# Patient Record
Sex: Female | Born: 1950 | Race: White | Hispanic: No | Marital: Single | Smoking: Never smoker
Health system: Southern US, Community
[De-identification: ages and names within clinical notes are randomized; demographics above are authoritative.]

## PROBLEM LIST (undated history)

## (undated) DIAGNOSIS — E559 Vitamin D deficiency, unspecified: Secondary | ICD-10-CM

## (undated) DIAGNOSIS — E119 Type 2 diabetes mellitus without complications: Secondary | ICD-10-CM

## (undated) DIAGNOSIS — Z923 Personal history of irradiation: Secondary | ICD-10-CM

## (undated) DIAGNOSIS — T8859XA Other complications of anesthesia, initial encounter: Secondary | ICD-10-CM

## (undated) DIAGNOSIS — M109 Gout, unspecified: Secondary | ICD-10-CM

## (undated) DIAGNOSIS — Z973 Presence of spectacles and contact lenses: Secondary | ICD-10-CM

## (undated) DIAGNOSIS — I10 Essential (primary) hypertension: Secondary | ICD-10-CM

## (undated) DIAGNOSIS — E7800 Pure hypercholesterolemia, unspecified: Secondary | ICD-10-CM

## (undated) DIAGNOSIS — M1990 Unspecified osteoarthritis, unspecified site: Secondary | ICD-10-CM

## (undated) DIAGNOSIS — E782 Mixed hyperlipidemia: Secondary | ICD-10-CM

## (undated) DIAGNOSIS — N183 Chronic kidney disease, stage 3 unspecified: Secondary | ICD-10-CM

## (undated) HISTORY — PX: EYE SURGERY: SHX253

## (undated) HISTORY — PX: BREAST LUMPECTOMY: SHX2

---
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
## 1987-10-14 HISTORY — PX: CHOLECYSTECTOMY: SHX55

## 2000-03-09 ENCOUNTER — Encounter: Admission: RE | Admit: 2000-03-09 | Discharge: 2000-03-09 | Payer: Self-pay | Admitting: Family Medicine

## 2000-03-09 ENCOUNTER — Encounter: Payer: Self-pay | Admitting: Family Medicine

## 2000-04-02 ENCOUNTER — Other Ambulatory Visit: Admission: RE | Admit: 2000-04-02 | Discharge: 2000-04-02 | Payer: Self-pay | Admitting: Family Medicine

## 2000-06-22 ENCOUNTER — Other Ambulatory Visit: Admission: RE | Admit: 2000-06-22 | Discharge: 2000-06-22 | Payer: Self-pay | Admitting: Family Medicine

## 2000-08-04 ENCOUNTER — Other Ambulatory Visit: Admission: RE | Admit: 2000-08-04 | Discharge: 2000-08-04 | Payer: Self-pay | Admitting: Obstetrics and Gynecology

## 2000-08-04 ENCOUNTER — Encounter (INDEPENDENT_AMBULATORY_CARE_PROVIDER_SITE_OTHER): Payer: Self-pay | Admitting: Specialist

## 2000-10-13 HISTORY — PX: LASIK: SHX215

## 2001-01-05 ENCOUNTER — Other Ambulatory Visit: Admission: RE | Admit: 2001-01-05 | Discharge: 2001-01-05 | Payer: Self-pay | Admitting: Obstetrics and Gynecology

## 2001-01-14 ENCOUNTER — Encounter (INDEPENDENT_AMBULATORY_CARE_PROVIDER_SITE_OTHER): Payer: Self-pay | Admitting: Specialist

## 2001-01-14 ENCOUNTER — Other Ambulatory Visit: Admission: RE | Admit: 2001-01-14 | Discharge: 2001-01-14 | Payer: Self-pay | Admitting: Obstetrics and Gynecology

## 2001-03-10 ENCOUNTER — Encounter: Payer: Self-pay | Admitting: Family Medicine

## 2001-03-10 ENCOUNTER — Encounter: Admission: RE | Admit: 2001-03-10 | Discharge: 2001-03-10 | Payer: Self-pay | Admitting: Family Medicine

## 2001-05-06 ENCOUNTER — Other Ambulatory Visit: Admission: RE | Admit: 2001-05-06 | Discharge: 2001-05-06 | Payer: Self-pay | Admitting: Obstetrics and Gynecology

## 2001-09-13 ENCOUNTER — Other Ambulatory Visit: Admission: RE | Admit: 2001-09-13 | Discharge: 2001-09-13 | Payer: Self-pay | Admitting: Obstetrics and Gynecology

## 2002-03-16 ENCOUNTER — Encounter: Payer: Self-pay | Admitting: Family Medicine

## 2002-03-16 ENCOUNTER — Encounter: Admission: RE | Admit: 2002-03-16 | Discharge: 2002-03-16 | Payer: Self-pay | Admitting: Family Medicine

## 2002-04-28 ENCOUNTER — Other Ambulatory Visit: Admission: RE | Admit: 2002-04-28 | Discharge: 2002-04-28 | Payer: Self-pay | Admitting: Obstetrics and Gynecology

## 2003-03-22 ENCOUNTER — Encounter: Admission: RE | Admit: 2003-03-22 | Discharge: 2003-03-22 | Payer: Self-pay | Admitting: Family Medicine

## 2003-03-22 ENCOUNTER — Encounter: Payer: Self-pay | Admitting: Family Medicine

## 2003-05-02 ENCOUNTER — Other Ambulatory Visit: Admission: RE | Admit: 2003-05-02 | Discharge: 2003-05-02 | Payer: Self-pay | Admitting: Obstetrics and Gynecology

## 2003-09-20 ENCOUNTER — Other Ambulatory Visit: Admission: RE | Admit: 2003-09-20 | Discharge: 2003-09-20 | Payer: Self-pay | Admitting: Obstetrics and Gynecology

## 2004-04-25 ENCOUNTER — Encounter: Admission: RE | Admit: 2004-04-25 | Discharge: 2004-04-25 | Payer: Self-pay | Admitting: Family Medicine

## 2004-05-01 ENCOUNTER — Encounter: Admission: RE | Admit: 2004-05-01 | Discharge: 2004-05-01 | Payer: Self-pay | Admitting: Family Medicine

## 2005-05-02 ENCOUNTER — Encounter: Admission: RE | Admit: 2005-05-02 | Discharge: 2005-05-02 | Payer: Self-pay | Admitting: Family Medicine

## 2006-05-04 ENCOUNTER — Encounter: Admission: RE | Admit: 2006-05-04 | Discharge: 2006-05-04 | Payer: Self-pay | Admitting: Family Medicine

## 2007-05-06 ENCOUNTER — Encounter: Admission: RE | Admit: 2007-05-06 | Discharge: 2007-05-06 | Payer: Self-pay | Admitting: Family Medicine

## 2008-05-08 ENCOUNTER — Encounter: Admission: RE | Admit: 2008-05-08 | Discharge: 2008-05-08 | Payer: Self-pay | Admitting: Family Medicine

## 2009-05-09 ENCOUNTER — Encounter: Admission: RE | Admit: 2009-05-09 | Discharge: 2009-05-09 | Payer: Self-pay | Admitting: Family Medicine

## 2010-05-13 ENCOUNTER — Encounter: Admission: RE | Admit: 2010-05-13 | Discharge: 2010-05-13 | Payer: Self-pay | Admitting: Family Medicine

## 2011-04-22 ENCOUNTER — Other Ambulatory Visit: Payer: Self-pay | Admitting: Family Medicine

## 2011-04-22 DIAGNOSIS — Z1231 Encounter for screening mammogram for malignant neoplasm of breast: Secondary | ICD-10-CM

## 2011-05-19 ENCOUNTER — Ambulatory Visit
Admission: RE | Admit: 2011-05-19 | Discharge: 2011-05-19 | Disposition: A | Discharge status: Home / Self Care | Payer: 59 | Source: Ambulatory Visit | Attending: Family Medicine | Admitting: Family Medicine

## 2011-05-19 DIAGNOSIS — Z1231 Encounter for screening mammogram for malignant neoplasm of breast: Secondary | ICD-10-CM

## 2012-04-22 ENCOUNTER — Other Ambulatory Visit: Payer: Self-pay | Admitting: Family Medicine

## 2012-04-22 DIAGNOSIS — Z1231 Encounter for screening mammogram for malignant neoplasm of breast: Secondary | ICD-10-CM

## 2012-05-19 ENCOUNTER — Ambulatory Visit
Admission: RE | Admit: 2012-05-19 | Discharge: 2012-05-19 | Disposition: A | Discharge status: Home / Self Care | Payer: 59 | Source: Ambulatory Visit | Attending: Family Medicine | Admitting: Family Medicine

## 2012-05-19 ENCOUNTER — Ambulatory Visit: Payer: 59

## 2012-05-19 DIAGNOSIS — Z1231 Encounter for screening mammogram for malignant neoplasm of breast: Secondary | ICD-10-CM

## 2013-06-23 ENCOUNTER — Other Ambulatory Visit: Payer: Self-pay

## 2013-06-23 DIAGNOSIS — Z1231 Encounter for screening mammogram for malignant neoplasm of breast: Secondary | ICD-10-CM

## 2013-06-27 ENCOUNTER — Ambulatory Visit
Admission: RE | Admit: 2013-06-27 | Discharge: 2013-06-27 | Disposition: A | Discharge status: Home / Self Care | Payer: 59 | Source: Ambulatory Visit

## 2013-06-27 DIAGNOSIS — Z1231 Encounter for screening mammogram for malignant neoplasm of breast: Secondary | ICD-10-CM

## 2014-07-14 ENCOUNTER — Other Ambulatory Visit: Payer: Self-pay

## 2014-07-14 DIAGNOSIS — Z1231 Encounter for screening mammogram for malignant neoplasm of breast: Secondary | ICD-10-CM

## 2014-07-18 ENCOUNTER — Encounter (INDEPENDENT_AMBULATORY_CARE_PROVIDER_SITE_OTHER): Payer: Self-pay

## 2014-07-18 ENCOUNTER — Ambulatory Visit
Admission: RE | Admit: 2014-07-18 | Discharge: 2014-07-18 | Disposition: A | Discharge status: Home / Self Care | Payer: 59 | Source: Ambulatory Visit

## 2014-07-18 DIAGNOSIS — Z1231 Encounter for screening mammogram for malignant neoplasm of breast: Secondary | ICD-10-CM

## 2015-07-16 ENCOUNTER — Other Ambulatory Visit: Payer: Self-pay

## 2015-07-16 DIAGNOSIS — Z1231 Encounter for screening mammogram for malignant neoplasm of breast: Secondary | ICD-10-CM

## 2015-08-02 ENCOUNTER — Ambulatory Visit: Payer: Self-pay

## 2015-08-27 ENCOUNTER — Ambulatory Visit
Admission: RE | Admit: 2015-08-27 | Discharge: 2015-08-27 | Disposition: A | Discharge status: Home / Self Care | Payer: 59 | Source: Ambulatory Visit

## 2015-08-27 DIAGNOSIS — Z1231 Encounter for screening mammogram for malignant neoplasm of breast: Secondary | ICD-10-CM

## 2015-08-30 ENCOUNTER — Other Ambulatory Visit: Payer: Self-pay | Admitting: Family Medicine

## 2015-08-30 DIAGNOSIS — N63 Unspecified lump in unspecified breast: Secondary | ICD-10-CM

## 2015-09-12 ENCOUNTER — Ambulatory Visit
Admission: RE | Admit: 2015-09-12 | Discharge: 2015-09-12 | Disposition: A | Discharge status: Home / Self Care | Payer: 59 | Source: Ambulatory Visit | Attending: Family Medicine | Admitting: Family Medicine

## 2015-09-12 DIAGNOSIS — N63 Unspecified lump in unspecified breast: Secondary | ICD-10-CM

## 2016-08-01 ENCOUNTER — Other Ambulatory Visit: Payer: Self-pay | Admitting: Family Medicine

## 2016-08-01 DIAGNOSIS — Z1231 Encounter for screening mammogram for malignant neoplasm of breast: Secondary | ICD-10-CM

## 2016-09-15 ENCOUNTER — Ambulatory Visit
Admission: RE | Admit: 2016-09-15 | Discharge: 2016-09-15 | Disposition: A | Discharge status: Home / Self Care | Payer: 59 | Source: Ambulatory Visit | Attending: Family Medicine | Admitting: Family Medicine

## 2016-09-15 DIAGNOSIS — Z1231 Encounter for screening mammogram for malignant neoplasm of breast: Secondary | ICD-10-CM

## 2018-06-01 ENCOUNTER — Other Ambulatory Visit: Payer: Self-pay | Admitting: Family Medicine

## 2018-06-01 DIAGNOSIS — Z1231 Encounter for screening mammogram for malignant neoplasm of breast: Secondary | ICD-10-CM

## 2018-06-30 ENCOUNTER — Ambulatory Visit
Admission: RE | Admit: 2018-06-30 | Discharge: 2018-06-30 | Disposition: A | Discharge status: Home / Self Care | Payer: 59 | Source: Ambulatory Visit | Attending: Family Medicine | Admitting: Family Medicine

## 2018-06-30 DIAGNOSIS — Z1231 Encounter for screening mammogram for malignant neoplasm of breast: Secondary | ICD-10-CM

## 2019-06-15 ENCOUNTER — Other Ambulatory Visit: Payer: Self-pay | Admitting: Family Medicine

## 2019-06-15 DIAGNOSIS — Z1231 Encounter for screening mammogram for malignant neoplasm of breast: Secondary | ICD-10-CM

## 2019-06-15 DIAGNOSIS — E2839 Other primary ovarian failure: Secondary | ICD-10-CM

## 2019-07-29 ENCOUNTER — Ambulatory Visit
Admission: RE | Admit: 2019-07-29 | Discharge: 2019-07-29 | Disposition: A | Discharge status: Home / Self Care | Payer: 59 | Source: Ambulatory Visit | Attending: Family Medicine | Admitting: Family Medicine

## 2019-07-29 ENCOUNTER — Other Ambulatory Visit: Payer: Self-pay

## 2019-07-29 DIAGNOSIS — Z1231 Encounter for screening mammogram for malignant neoplasm of breast: Secondary | ICD-10-CM

## 2019-08-19 ENCOUNTER — Other Ambulatory Visit: Payer: Self-pay

## 2019-08-19 ENCOUNTER — Ambulatory Visit
Admission: RE | Admit: 2019-08-19 | Discharge: 2019-08-19 | Disposition: A | Discharge status: Home / Self Care | Payer: 59 | Source: Ambulatory Visit | Attending: Family Medicine | Admitting: Family Medicine

## 2019-08-19 DIAGNOSIS — E2839 Other primary ovarian failure: Secondary | ICD-10-CM

## 2019-12-22 ENCOUNTER — Ambulatory Visit: Payer: 59 | Attending: Internal Medicine

## 2019-12-22 DIAGNOSIS — Z23 Encounter for immunization: Secondary | ICD-10-CM

## 2019-12-22 NOTE — Progress Notes (Signed)
   Covid-19 Vaccination Clinic  Name:  Becky Dudley    MRN: [id number] DOB: 1951/02/24  12/22/2019  [name] was observed post Covid-19 immunization for 15 minutes without incident. She was provided with Vaccine Information Sheet and instruction to access the V-Safe system.   [name] was instructed to call 911 with any severe reactions post vaccine: Marland Kitchen Difficulty breathing  . Swelling of face and throat  . A fast heartbeat  . A bad rash all over body  . Dizziness and weakness   Immunizations Administered    Name Date Dose VIS Date Route   Pfizer COVID-19 Vaccine 12/22/2019  8:40 AM 0.3 mL 09/23/2019 Intramuscular   Manufacturer: [place]   Lot: [id number]   [place]: [id number]

## 2020-01-16 ENCOUNTER — Ambulatory Visit: Payer: 59 | Attending: Internal Medicine

## 2020-01-16 DIAGNOSIS — Z23 Encounter for immunization: Secondary | ICD-10-CM

## 2020-01-16 NOTE — Progress Notes (Signed)
   Covid-19 Vaccination Clinic  Name:  Becky Dudley    MRN: [id number] DOB: 1950-10-18  01/16/2020  [name] was observed post Covid-19 immunization for 15 minutes without incident. She was provided with Vaccine Information Sheet and instruction to access the V-Safe system.   [name] was instructed to call 911 with any severe reactions post vaccine: Marland Kitchen Difficulty breathing  . Swelling of face and throat  . A fast heartbeat  . A bad rash all over body  . Dizziness and weakness   Immunizations Administered    Name Date Dose VIS Date Route   Pfizer COVID-19 Vaccine 01/16/2020  8:45 AM 0.3 mL 09/23/2019 Intramuscular   Manufacturer: [place]   Lot: [id number]   [place]: [id number]

## 2020-07-10 ENCOUNTER — Other Ambulatory Visit: Payer: Self-pay

## 2020-07-10 ENCOUNTER — Ambulatory Visit: Payer: 59 | Attending: Family Medicine

## 2020-07-10 DIAGNOSIS — M545 Low back pain, unspecified: Secondary | ICD-10-CM

## 2020-07-10 DIAGNOSIS — R293 Abnormal posture: Secondary | ICD-10-CM | POA: Diagnosis present

## 2020-07-10 DIAGNOSIS — M2560 Stiffness of unspecified joint, not elsewhere classified: Secondary | ICD-10-CM | POA: Diagnosis present

## 2020-07-10 DIAGNOSIS — G8929 Other chronic pain: Secondary | ICD-10-CM | POA: Insufficient documentation

## 2020-07-10 NOTE — Patient Instructions (Signed)
Stretching SKC, LTR, piriformis, seated side bend and rotation  2-3 reps RT and LT 2x/day 15-30 sec hold PPT x10 5 sec hold 2x/day

## 2020-07-10 NOTE — Therapy (Signed)
[place], [place], [address] Phone: [phone]   Fax:  [phone]  Physical Therapy Evaluation  Patient Details  Name: Becky Dudley MRN: [id number] Date of Birth: 1951-07-30 Referring Provider (PT): Sela Hilding, MD   Encounter Date: 07/10/2020   PT End of Session - 07/10/20 0925    Visit Number 1    Number of Visits 6    Date for PT Re-Evaluation 08/17/20    Authorization Type UHC    PT Start Time 0830    PT Stop Time 0915    PT Time Calculation (min) 45 min    Activity Tolerance Patient tolerated treatment well    Behavior During Therapy [facility] for tasks assessed/performed           History reviewed. No pertinent past medical history.  History reviewed. No pertinent surgical history.  There were no vitals filed for this visit.    Subjective Assessment - 07/10/20 0825    Subjective Oa Reports tightness in back and knees. Not much pain.  in past few months more stiff but ahs been going on for longer. Not sure about exercise.    Limitations --   getting up from sitting   How long can you sit comfortably? as needed    How long can you stand comfortably? standing in kitchen  for any period    How long can you walk comfortably? < 300 feet.    Diagnostic tests 10 years ago Dietitian)    Patient Stated Goals decr stiffness in back with activity    Currently in Pain? No/denies    Pain Location Back    Pain Orientation Right;Left;Lower;Posterior    Pain Descriptors / Indicators Tightness    Pain Type Chronic pain    Pain Radiating Towards occasional anterior hips    Pain Onset More than a month ago    Pain Frequency Intermittent    Aggravating Factors  standing and transitions    Pain Relieving Factors sitting              [facility] PT Assessment - 07/10/20 0001      Assessment   Medical Diagnosis LBP    Referring Provider (PT) Sela Hilding, MD    Onset Date/Surgical Date  --   months   Next MD Visit AS needed    Prior Therapy no      Precautions   Precautions None      Restrictions   Weight Bearing Restrictions No      Balance Screen   Has the patient fallen in the past 6 months No      [place] residence    Additional Comments Knees impact mobility       Prior Function   Level of Independence Independent      Cognition   Overall Cognitive Status Within Functional Limits for tasks assessed      Observation/Other Assessments   Focus on Therapeutic Outcomes (FOTO)  55%      Posture/Postural Control   Posture Comments May have some scoliosis      ROM / Strength   AROM / PROM / Strength AROM;PROM;Strength      AROM   AROM Assessment Site Lumbar    Lumbar Flexion 90   touches floor   Lumbar Extension 20    Lumbar - Right Side Bend 12    Lumbar - Left Side Bend 15    Lumbar - Right Rotation 60%  Lumbar - Left Rotation 60%      Flexibility   Soft Tissue Assessment /Muscle Length yes                      Objective measurements completed on examination: See above findings.               PT Education - 07/10/20 0935    Education Details POC HEP , reviewed FOTO score and possible progress    Person(s) Educated Patient    Methods Explanation;Tactile cues;Verbal cues;Handout    Comprehension Verbalized understanding;Returned demonstration            PT Short Term Goals - 07/10/20 0930      PT SHORT TERM GOAL #1   Title She will be indpendent wih intial HEP    Time 3    Period Weeks    Status New      PT SHORT TERM GOAL #2   Title She will report 25% or more easing of tightness in lower back after sitting at work    Time 3    Period Weeks    Status New             PT Long Term Goals - 07/10/20 0931      PT LONG TERM GOAL #1   Title She will be independent with all HEP issued    Time 6    Period Weeks    Status New      PT LONG TERM GOAL #2   Title She  will report 50% easing of tightness in lower back with working in [employer]    Time 6    Period Weeks    Status New      PT LONG TERM GOAL #3   Title she will be able to walk to mailbox with no tightness in lower back    Time 6    Period Weeks    Status New      PT LONG TERM GOAL #4   Title FOTO score will iimprove 10 points to 64%    Time 6    Period Weeks    Status New                  Plan - 07/10/20 0926    Clinical Impression Statement Becky Dudley presents with reports of mainly stiffness /tightness rather tan pain after sitting or being still for periods and difficulty standing to do activity in kitchen. She is interested in a program for helping to ease the stiffness/tight feeling and occasional discomfort. she should improve with skilled PT asssit in proression of HEP and consistent HEP by pt.    Personal Factors and Comorbidities Age;Fitness;Time since onset of injury/illness/exacerbation;Comorbidity 1    Comorbidities obesity    Examination-Activity Limitations Lift;Locomotion Level;Stand    Examination-Participation Restrictions Occupation    Stability/Clinical Decision Making Stable/Uncomplicated    Clinical Decision Making Low    Rehab Potential Good    PT Frequency 1x / week    PT Duration 6 weeks    PT Treatment/Interventions Patient/family education;Therapeutic exercise    PT Next Visit Plan review and progress HEP  core strength addition    PT Home Exercise Plan PPT stretch piriformis, SKC , LTR, seated sidebend ,seated rotation           Patient will benefit from skilled therapeutic intervention in order to improve the following deficits and impairments:  Pain, Decreased activity tolerance, Decreased range of  motion, Decreased strength, Postural dysfunction  Visit Diagnosis: Chronic bilateral low back pain without sciatica - Plan: PT plan of care cert/re-cert  Joint stiffness of spine - Plan: PT plan of care cert/re-cert  Abnormal posture - Plan:  PT plan of care cert/re-cert     Problem List There are no problems to display for this patient.   [name]   PT 07/10/2020, 9:46 AM  [facility] [address] [place], [place], [address] Phone: [phone]   Fax:  [phone]  Name: Becky Dudley MRN: [id number] Date of Birth: [birth year]

## 2020-07-16 ENCOUNTER — Other Ambulatory Visit: Payer: Self-pay

## 2020-07-16 ENCOUNTER — Ambulatory Visit: Payer: 59 | Attending: Family Medicine

## 2020-07-16 ENCOUNTER — Other Ambulatory Visit: Payer: Self-pay | Admitting: Family Medicine

## 2020-07-16 DIAGNOSIS — M545 Low back pain, unspecified: Secondary | ICD-10-CM | POA: Diagnosis not present

## 2020-07-16 DIAGNOSIS — R293 Abnormal posture: Secondary | ICD-10-CM | POA: Insufficient documentation

## 2020-07-16 DIAGNOSIS — M2560 Stiffness of unspecified joint, not elsewhere classified: Secondary | ICD-10-CM

## 2020-07-16 DIAGNOSIS — G8929 Other chronic pain: Secondary | ICD-10-CM | POA: Diagnosis present

## 2020-07-16 DIAGNOSIS — Z1231 Encounter for screening mammogram for malignant neoplasm of breast: Secondary | ICD-10-CM

## 2020-07-16 NOTE — Patient Instructions (Addendum)
PIlates   Ab prep, scissors L1+2, hip twist L1-3, shoulder bridge. L1, leg stretch L1  X 10-15 daily  RRT/LT

## 2020-07-16 NOTE — Therapy (Signed)
Becky Dudley, [place], [address] Phone: [phone]   Fax:  [phone]  Physical Therapy Treatment  Patient Details  Name: Becky Dudley MRN: [id number] Date of Birth: 06-24-1951 Referring Provider (PT): Sela Hilding, MD   Encounter Date: 07/16/2020   PT End of Session - 07/16/20 0830    Visit Number 2    Number of Visits 6    Date for PT Re-Evaluation 08/17/20    Authorization Type UHC    PT Start Time 0830    PT Stop Time 0910    PT Time Calculation (min) 40 min    Activity Tolerance Patient tolerated treatment well    Behavior During Therapy [facility] for tasks assessed/performed           No past medical history on file.  No past surgical history on file.  There were no vitals filed for this visit.   Subjective Assessment - 07/16/20 0832    Subjective No problems . a little soreness occasionally.  no problems with HEP and did most days    Currently in Pain? No/denies                             [facility] Adult PT Treatment/Exercise - 07/16/20 0001      Exercises   Exercises Lumbar      Lumbar Exercises: Stretches   Lower Trunk Rotation 10 seconds;3 reps    Lower Trunk Rotation Limitations then 2 rep RT/LT  20 sec with knee crossed    Pelvic Tilt 10 reps;5 seconds    Other Lumbar Stretch Exercise sitting side bend an drtotation RT/Lt  10-15 sec hold       Lumbar Exercises: Supine   Bent Knee Raise 10 reps;2 seconds    Bent Knee Raise Limitations RT/LT          Pilates arm raises with ab set, hip twist  With leg table top and single leg feet on mat,  Shoulder bridge  All x10 reps          PT Education - 07/16/20 0918    Education Details Cor e strength    Person(s) Educated Patient    Methods Explanation;Demonstration;Tactile cues;Verbal cues;Handout    Comprehension Verbalized understanding;Returned demonstration            PT Short Term Goals -  07/10/20 0930      PT SHORT TERM GOAL #1   Title She will be indpendent wih intial HEP    Time 3    Period Weeks    Status New      PT SHORT TERM GOAL #2   Title She will report 25% or more easing of tightness in lower back after sitting at work    Time 3    Period Weeks    Status New             PT Long Term Goals - 07/10/20 0931      PT LONG TERM GOAL #1   Title She will be independent with all HEP issued    Time 6    Period Weeks    Status New      PT LONG TERM GOAL #2   Title She will report 50% easing of tightness in lower back with working in [employer]    Time 6    Period Weeks    Status New      PT LONG TERM  GOAL #3   Title she will be able to walk to mailbox with no tightness in lower back    Time 6    Period Weeks    Status New      PT LONG TERM GOAL #4   Title FOTO score will iimprove 10 points to 64%    Time 6    Period Weeks    Status New                 Plan - 07/16/20 0831    Clinical Impression Statement Did well with all new core strength and able to demo stretching from eval.  progress core strength   ? extension stretch    PT Treatment/Interventions Patient/family education;Therapeutic exercise    PT Next Visit Plan review and progress HEP  core strength addition    PT Home Exercise Plan PPT stretch piriformis, SKC , LTR, seated sidebend ,seated rotation  Pilates ab prep, scissor L1-2, hip twist L1 , Bilateral arm raises with ab set.    Consulted and Agree with Plan of Care Patient           Patient will benefit from skilled therapeutic intervention in order to improve the following deficits and impairments:  Pain, Decreased activity tolerance, Decreased range of motion, Decreased strength, Postural dysfunction  Visit Diagnosis: Chronic bilateral low back pain without sciatica  Joint stiffness of spine  Abnormal posture     Problem List There are no problems to display for this patient.   [name]   PT 07/16/2020, 9:20 AM  [facility] [address] [place], [place], [address] Phone: [phone]   Fax:  [phone]  Name: Becky Dudley MRN: [id number] Date of Birth: Jun 04, 1951

## 2020-07-25 ENCOUNTER — Ambulatory Visit: Payer: 59 | Admitting: Physical Therapy

## 2020-07-25 ENCOUNTER — Other Ambulatory Visit: Payer: Self-pay

## 2020-07-25 ENCOUNTER — Encounter: Payer: Self-pay | Admitting: Physical Therapy

## 2020-07-25 DIAGNOSIS — M2560 Stiffness of unspecified joint, not elsewhere classified: Secondary | ICD-10-CM

## 2020-07-25 DIAGNOSIS — R293 Abnormal posture: Secondary | ICD-10-CM

## 2020-07-25 DIAGNOSIS — M545 Low back pain, unspecified: Secondary | ICD-10-CM | POA: Diagnosis not present

## 2020-07-25 DIAGNOSIS — G8929 Other chronic pain: Secondary | ICD-10-CM

## 2020-07-25 NOTE — Therapy (Signed)
[place], [place], [address] Phone: [phone]   Fax:  [phone]  Physical Therapy Treatment  Patient Details  Name: Becky Dudley MRN: [id number] Date of Birth: 1951/08/20 Referring Provider (PT): Sela Hilding, MD   Encounter Date: 07/25/2020   PT End of Session - 07/25/20 0806    Visit Number 3    Number of Visits 6    Date for PT Re-Evaluation 08/17/20    Authorization Type UHC    PT Start Time 0800    PT Stop Time 0845    PT Time Calculation (min) 45 min           History reviewed. No pertinent past medical history.  History reviewed. No pertinent surgical history.  There were no vitals filed for this visit.   Subjective Assessment - 07/25/20 0804    Subjective My pain is more of a tightness than pain in the back. The knees are sore. I can get up and down stairs a little better since using the voltaren.    Currently in Pain? No/denies                             [facility] Adult PT Treatment/Exercise - 07/25/20 0001      Lumbar Exercises: Stretches   Lower Trunk Rotation 10 seconds;3 reps    Lower Trunk Rotation Limitations then 2 rep RT/LT  20 sec with knee crossed    Pelvic Tilt 10 reps;5 seconds    Pelvic Tilt Limitations seated and supine     Other Lumbar Stretch Exercise sitting side bend an drtotation RT/Lt  10-15 sec hold x3 each     Other Lumbar Stretch Exercise Seated childs pose- using Green physioball    laterals x 2 each way      Lumbar Exercises: Aerobic   Nustep L4 UE/LE x 5 minutes       Lumbar Exercises: Seated   Other Seated Lumbar Exercises Seated Scap squeeze , pelvic tilits and posture education-supported sitting       Lumbar Exercises: Supine   AB Set Limitations Pilates Ab prep- head lift x 10 per HEP    Pelvic Tilt 10 reps    Clam 10 reps    Clam Limitations With cues for PPT     Bent Knee Raise 5 reps;2 seconds    Bent Knee Raise  Limitations L1 and L2 Pilates    Bridge Limitations shoulder bridge per HEP x 10-    Other Supine Lumbar Exercises Double LEg Stretch LEvel 1 from HEP (arm circles)                    PT Short Term Goals - 07/10/20 0930      PT SHORT TERM GOAL #1   Title She will be indpendent wih intial HEP    Time 3    Period Weeks    Status New      PT SHORT TERM GOAL #2   Title She will report 25% or more easing of tightness in lower back after sitting at work    Time 3    Period Weeks    Status New             PT Long Term Goals - 07/10/20 0931      PT LONG TERM GOAL #1   Title She will be independent with all HEP issued    Time 6  Period Weeks    Status New      PT LONG TERM GOAL #2   Title She will report 50% easing of tightness in lower back with working in [employer]    Time 6    Period Weeks    Status New      PT LONG TERM GOAL #3   Title she will be able to walk to mailbox with no tightness in lower back    Time 6    Period Weeks    Status New      PT LONG TERM GOAL #4   Title FOTO score will iimprove 10 points to 64%    Time 6    Period Weeks    Status New                 Plan - 07/25/20 0845    Clinical Impression Statement Continued with Core strength and reviewed her paper HEP with Pilates exercises. Some pressure in head with head lifts she attributes to H/O vertigo, otherwise session tolerated well.    PT Next Visit Plan review and progress HEP  core strength addition    PT Home Exercise Plan PPT stretch piriformis, SKC , LTR, seated sidebend ,seated rotation  Pilates ab prep, scissor L1-2, hip twist L1 , Bilateral arm raises with ab set.           Patient will benefit from skilled therapeutic intervention in order to improve the following deficits and impairments:  Pain, Decreased activity tolerance, Decreased range of motion, Decreased strength, Postural dysfunction  Visit Diagnosis: Chronic bilateral low back pain without  sciatica  Joint stiffness of spine  Abnormal posture     Problem List There are no problems to display for this patient.   Dorene Ar, [place] 07/25/2020, 10:10 AM  [facility] [address] [place], [place], [address] Phone: [phone]   Fax:  [phone]  Name: Becky Dudley MRN: [id number] Date of Birth: Apr 21, 1951

## 2020-07-30 ENCOUNTER — Other Ambulatory Visit: Payer: Self-pay

## 2020-07-30 ENCOUNTER — Ambulatory Visit: Payer: 59

## 2020-07-30 DIAGNOSIS — M2560 Stiffness of unspecified joint, not elsewhere classified: Secondary | ICD-10-CM

## 2020-07-30 DIAGNOSIS — R293 Abnormal posture: Secondary | ICD-10-CM

## 2020-07-30 DIAGNOSIS — G8929 Other chronic pain: Secondary | ICD-10-CM

## 2020-07-30 DIAGNOSIS — M545 Low back pain, unspecified: Secondary | ICD-10-CM | POA: Diagnosis not present

## 2020-07-30 NOTE — Therapy (Addendum)
[place], [place], [address] Phone: [phone]   Fax:  [phone]  Physical Therapy Treatment  Patient Details  Name: Dashana Guizar MRN: [id number] Date of Birth: [birth year] Referring Provider (PT): Sela Hilding, MD   Encounter Date: 07/30/2020   PT End of Session - 07/30/20 0829    Visit Number 4    Number of Visits 6    Date for PT Re-Evaluation 08/17/20    Authorization Type UHC    PT Start Time 0830    PT Stop Time 0925    PT Time Calculation (min) 55 min    Activity Tolerance Patient tolerated treatment well    Behavior During Therapy [facility] for tasks assessed/performed           No past medical history on file.  No past surgical history on file.  There were no vitals filed for this visit.   Subjective Assessment - 07/30/20 0939    Subjective Occasional soreness that does not last  post exercises. Doing some better with stiffness.  Able to do all exercises.    Currently in Pain? No/denies                             [facility] Adult PT Treatment/Exercise - 07/30/20 0001      Lumbar Exercises: Stretches   Lower Trunk Rotation 10 seconds;3 reps    Lower Trunk Rotation Limitations then 2 rep RT/LT  20 sec with knee crossed    Pelvic Tilt 10 reps;5 seconds    Pelvic Tilt Limitations supine      Lumbar Exercises: Aerobic   Nustep L5 UE/LE x 5 minutes       Lumbar Exercises: Standing   Row Both;15 reps    Theraband Level (Row) Level 3 (Green)    Shoulder Extension Both;15 reps    Theraband Level (Shoulder Extension) Level 3 (Green)    Other Standing Lumbar Exercises Punches    Other Standing Lumbar Exercises stand  back to wall PPT with forward reach both hands on exhalation x 5 reps      Lumbar Exercises: Supine   AB Set Limitations Pilates Ab prep- no head lift x 10 legs in table top    Pelvic Tilt 10 reps    Clam 10 reps    Clam Limitations With cues for  PPT green band     Bridge Limitations shoulder bridge per HEP x 15                  PT Education - 07/30/20 0931    Education Details band exercises and wall PPT     She will stop  Pilates abdominal prep   stretching spine into ext with threaball on wall.    Person(s) Educated Patient    Methods Explanation;Demonstration;Tactile cues;Verbal cues;Handout    Comprehension Verbalized understanding;Returned demonstration            PT Short Term Goals - 07/30/20 0934      PT SHORT TERM GOAL #1   Title She will be indpendent wih intial HEP    Status Achieved      PT SHORT TERM GOAL #2   Title She will report 25% or more easing of tightness in lower back after sitting at work    Baseline She reports improveing but still has stiffnes due to long hours sitting    Status On-going  PT Long Term Goals - 07/10/20 0931      PT LONG TERM GOAL #1   Title She will be independent with all HEP issued    Time 6    Period Weeks    Status New      PT LONG TERM GOAL #2   Title She will report 50% easing of tightness in lower back with working in [employer]    Time 6    Period Weeks    Status New      PT LONG TERM GOAL #3   Title she will be able to walk to mailbox with no tightness in lower back    Time 6    Period Weeks    Status New      PT LONG TERM GOAL #4   Title FOTO score will iimprove 10 points to 64%    Time 6    Period Weeks    Status New                 Plan - 07/30/20 0829    Clinical Impression Statement Will continue P"OC for sore sttrength. Doining well but gets some soreness with exercises. Went away not to long after.   Vertigo mild with lifting head. Stopped  abdominalprep exercise. No pain post session.    PT Treatment/Interventions Patient/family education;Therapeutic exercise    PT Next Visit Plan review and progress HEP  core strength addition    FOTO    PT Home Exercise Plan PPT stretch piriformis, SKC , LTR, seated sidebend  ,seated rotation  Pilates ab prep, scissor L1-2, hip twist L1 ,  Green band row/extension /punches, wall PPT with bilateral reaching    Consulted and Agree with Plan of Care Patient           Patient will benefit from skilled therapeutic intervention in order to improve the following deficits and impairments:  Pain, Decreased activity tolerance, Decreased range of motion, Decreased strength, Postural dysfunction  Visit Diagnosis: Chronic bilateral low back pain without sciatica  Joint stiffness of spine  Abnormal posture     Problem List There are no problems to display for this patient.   [name]  PT 07/30/2020, 9:41 AM  [facility] [address] [place], [place], [address] Phone: [phone]   Fax:  [phone]  Name: Janaa Acero MRN: [id number] Date of Birth: 09/24/1951

## 2020-07-30 NOTE — Patient Instructions (Addendum)
Wall sit with PPT and exhalation with forward reach x 5 reps daily Greenband  row/extension and punches x 15 reps daily  bilaterally

## 2020-08-01 ENCOUNTER — Other Ambulatory Visit: Payer: Self-pay

## 2020-08-01 ENCOUNTER — Ambulatory Visit
Admission: RE | Admit: 2020-08-01 | Discharge: 2020-08-01 | Disposition: A | Discharge status: Home / Self Care | Payer: 59 | Source: Ambulatory Visit | Attending: Family Medicine | Admitting: Family Medicine

## 2020-08-01 DIAGNOSIS — Z1231 Encounter for screening mammogram for malignant neoplasm of breast: Secondary | ICD-10-CM

## 2020-08-06 ENCOUNTER — Ambulatory Visit: Payer: 59

## 2020-08-06 ENCOUNTER — Other Ambulatory Visit: Payer: Self-pay

## 2020-08-06 DIAGNOSIS — R293 Abnormal posture: Secondary | ICD-10-CM

## 2020-08-06 DIAGNOSIS — M2560 Stiffness of unspecified joint, not elsewhere classified: Secondary | ICD-10-CM

## 2020-08-06 DIAGNOSIS — G8929 Other chronic pain: Secondary | ICD-10-CM

## 2020-08-06 DIAGNOSIS — M545 Low back pain, unspecified: Secondary | ICD-10-CM | POA: Diagnosis not present

## 2020-08-06 NOTE — Therapy (Signed)
[place], [place], [address] Phone: [phone]   Fax:  [phone]  Physical Therapy Treatment  Patient Details  Name: Becky Dudley MRN: [id number] Date of Birth: 06-03-1951 Referring Provider (PT): Sela Hilding, MD   Encounter Date: 08/06/2020   PT End of Session - 08/06/20 0921    Visit Number 5    Number of Visits 8    Date for PT Re-Evaluation 08/31/20    Authorization Type UHC    PT Start Time 0920    PT Stop Time 1000    PT Time Calculation (min) 40 min    Activity Tolerance Patient tolerated treatment well    Behavior During Therapy [facility] for tasks assessed/performed           History reviewed. No pertinent past medical history.  History reviewed. No pertinent surgical history.  There were no vitals filed for this visit.   Subjective Assessment - 08/06/20 0920    Subjective no pain today.      No problem with  HEP.   Not doing as much as would like.    Currently in Pain? No/denies                             [facility] Adult PT Treatment/Exercise - 08/06/20 0001      Lumbar Exercises: Stretches   Single Knee to Chest Stretch Right;Left    Lower Trunk Rotation Limitations with  knee over knee    Other Lumbar Stretch Exercise Seated childs pose- using Green physioball x 5 central and to RT and to LT       Lumbar Exercises: Standing   Row Both;20 reps    Theraband Level (Row) Level 3 (Green)    Shoulder Extension Both;20 reps    Theraband Level (Shoulder Extension) Level 3 (Green)    Other Standing Lumbar Exercises Punches    Other Standing Lumbar Exercises stand  back to wall PPT with forward reach both hands on exhalation x 5 reps      Lumbar Exercises: Supine   Bent Knee Raise 10 reps    Bent Knee Raise Limitations Pilates L2   RT/LT     Bridge Limitations shoulder bridge per HEP x 15    Bridge with [employer] 15 reps    Bridge with [employer]  Limitations short lift with gluteal set     Bridge with clamshell 15 reps    Bridge with [employer] Limitations blue band short lift with glut set.            Worked on options for posture (foot under cabinet on shelf)  And stretching before starting the dishes ( with flexion).          PT Short Term Goals - 08/06/20 1017      PT SHORT TERM GOAL #2   Title She will report 25% or more easing of tightness in lower back after sitting at work    Status Achieved             PT Long Term Goals - 08/06/20 0922      PT LONG TERM GOAL #1   Title She will be independent with all HEP issued    Status On-going      PT LONG TERM GOAL #2   Title She will report 50% easing of tightness in lower back with working in [employer]    Baseline 20-25% better  Status On-going      PT LONG TERM GOAL #3   Title she will be able to walk to mailbox with no tightness in lower back    Baseline improved but tightess on way back    Status On-going                 Plan - 08/06/20 1537    Clinical Impression Statement Continue core strength and stretching. sloww but noticable progress with standing tolerance/decr pain and tightness.    PT Frequency 1x / week    PT Duration 3 weeks    PT Treatment/Interventions Patient/family education;Therapeutic exercise    PT Next Visit Plan review and progress HEP  core strength addition    FOTO    PT Home Exercise Plan PPT stretch piriformis, SKC , LTR, seated sidebend ,seated rotation  Pilates ab prep, scissor L1-2, hip twist L1 ,  Green/blue band row/extension /punches, wall PPT with bilateral reaching    Consulted and Agree with Plan of Care Patient           Patient will benefit from skilled therapeutic intervention in order to improve the following deficits and impairments:  Pain, Decreased activity tolerance, Decreased range of motion, Decreased strength, Postural dysfunction  Visit Diagnosis: Abnormal posture  Joint stiffness of  spine  Chronic bilateral low back pain without sciatica     Problem List There are no problems to display for this patient.   [name]  PT 08/06/2020, 10:21 AM  [facility] [address] [place], [place], [address] Phone: [phone]   Fax:  [phone]  Name: Becky Dudley MRN: [id number] Date of Birth: January 17, 1951

## 2020-08-13 ENCOUNTER — Other Ambulatory Visit: Payer: Self-pay

## 2020-08-13 ENCOUNTER — Ambulatory Visit: Payer: 59 | Attending: Family Medicine

## 2020-08-13 DIAGNOSIS — M545 Low back pain, unspecified: Secondary | ICD-10-CM | POA: Diagnosis present

## 2020-08-13 DIAGNOSIS — G8929 Other chronic pain: Secondary | ICD-10-CM | POA: Insufficient documentation

## 2020-08-13 DIAGNOSIS — M2560 Stiffness of unspecified joint, not elsewhere classified: Secondary | ICD-10-CM | POA: Diagnosis not present

## 2020-08-13 DIAGNOSIS — R293 Abnormal posture: Secondary | ICD-10-CM | POA: Diagnosis present

## 2020-08-13 NOTE — Therapy (Signed)
[place], [place], [address] Phone: [phone]   Fax:  [phone]  Physical Therapy Treatment  Patient Details  Name: Becky Dudley MRN: [id number] Date of Birth: [birth year] Referring Provider (PT): Sela Hilding, MD   Encounter Date: 08/13/2020   PT End of Session - 08/13/20 0912    Visit Number 6    Number of Visits 8    Date for PT Re-Evaluation 08/31/20    Authorization Type UHC    PT Start Time 0830    PT Stop Time 0912    PT Time Calculation (min) 42 min    Activity Tolerance Patient tolerated treatment well    Behavior During Therapy [facility] for tasks assessed/performed           No past medical history on file.  No past surgical history on file.  There were no vitals filed for this visit.   Subjective Assessment - 08/13/20 0916    Subjective Doing well / Feels like good improvement . no pain today    Currently in Pain? No/denies                             [facility] Adult PT Treatment/Exercise - 08/13/20 0001      Lumbar Exercises: Stretches   Lower Trunk Rotation 3 reps;20 seconds    Lower Trunk Rotation Limitations with  knee over knee    Other Lumbar Stretch Exercise pilates saw and trunk rotation sitting  with exhalle on rotation      Lumbar Exercises: Aerobic   Nustep L5 UE/LE x 5 minutes       Lumbar Exercises: Standing   Row Both;20 reps    Theraband Level (Row) Level 4 (Blue)    Shoulder Extension Both;20 reps    Theraband Level (Shoulder Extension) Level 4 (Blue)    Other Standing Lumbar Exercises Punches blue band x 20      Lumbar Exercises: Supine   Bent Knee Raise 15 reps    Bent Knee Raise Limitations Pilates L2   RT/LT     Bridge Limitations shoulder bridge per HEP x 15    Bridge with [employer] 20 reps    Bridge with [employer] Limitations short lift with gluteal set     Bridge with clamshell 20 reps    Bridge with [employer]  Limitations blue band short lift with glut set.                   PT Education - 08/13/20 0912    Education Details HEP    Person(s) Educated Patient    Methods Explanation;Demonstration;Verbal cues;Handout    Comprehension Verbalized understanding;Returned demonstration            PT Short Term Goals - 08/06/20 1017      PT SHORT TERM GOAL #2   Title She will report 25% or more easing of tightness in lower back after sitting at work    Status Achieved             PT Long Term Goals - 08/06/20 6734      PT LONG TERM GOAL #1   Title She will be independent with all HEP issued    Status On-going      PT LONG TERM GOAL #2   Title She will report 50% easing of tightness in lower back with working in [employer]    Baseline 20-25% better  Status On-going      PT LONG TERM GOAL #3   Title she will be able to walk to mailbox with no tightness in lower back    Baseline improved but tightess on way back    Status On-going                 Plan - 08/13/20 0913    Clinical Impression Statement Continues to do the exercise well and no pain added rotation stretching seated for during day.   Cued to breath  with stretching.  Will see x 1 and address any concerns  then discharge if pt agrees    PT Treatment/Interventions Patient/family education;Therapeutic exercise    PT Home Exercise Plan PPT stretch piriformis, SKC , LTR, seated sidebend ,seated rotation  Pilates ab prep, scissor L1-2, hip twist L1 ,  Green/blue band row/extension /punches, wall PPT with bilateral reaching,, Pilates saw and trunk rotaiton seated    Consulted and Agree with Plan of Care Patient           Patient will benefit from skilled therapeutic intervention in order to improve the following deficits and impairments:  Pain, Decreased activity tolerance, Decreased range of motion, Decreased strength, Postural dysfunction  Visit Diagnosis: Joint stiffness of spine  Chronic bilateral low  back pain without sciatica  Abnormal posture     Problem List There are no problems to display for this patient.   [name]  PT 08/13/2020, 9:16 AM  [facility] [address] [place], [place], [address] Phone: [phone]   Fax:  [phone]  Name: Becky Dudley MRN: [id number] Date of Birth: 20-Aug-1951

## 2020-08-13 NOTE — Patient Instructions (Signed)
Pilates Saw and trunk rotation seated x 5-10 repos 5-10 sec hold with exhalation with rotation cued to depress /relax shoulders  Daily RT/LT

## 2020-08-20 ENCOUNTER — Other Ambulatory Visit: Payer: Self-pay

## 2020-08-20 ENCOUNTER — Ambulatory Visit: Payer: 59

## 2020-08-20 DIAGNOSIS — R293 Abnormal posture: Secondary | ICD-10-CM

## 2020-08-20 DIAGNOSIS — M2560 Stiffness of unspecified joint, not elsewhere classified: Secondary | ICD-10-CM

## 2020-08-20 DIAGNOSIS — G8929 Other chronic pain: Secondary | ICD-10-CM

## 2020-08-20 NOTE — Patient Instructions (Signed)
Clams and reverse clams and dead bug x 20 rpes every other day,  RT LT

## 2020-08-20 NOTE — Therapy (Addendum)
[place], [place], [address] Phone: [phone]   Fax:  [phone]  Physical Therapy Treatment/Discharge  Patient Details  Name: Becky Dudley MRN: [id number] Date of Birth: [birth year] Referring Provider (PT): Sela Hilding, MD   Encounter Date: 08/20/2020   PT End of Session - 08/20/20 0833    Visit Number 7    Number of Visits 8    Date for PT Re-Evaluation 08/31/20    Authorization Type UHC    PT Start Time 0830    PT Stop Time 0912    PT Time Calculation (min) 42 min    Activity Tolerance Patient tolerated treatment well    Behavior During Therapy [facility] for tasks assessed/performed           History reviewed. No pertinent past medical history.  History reviewed. No pertinent surgical history.  There were no vitals filed for this visit.   Subjective Assessment - 08/20/20 0923    Subjective Doing well , no pain , Can walk steps at home better.    Currently in Pain? No/denies                             [facility] Adult PT Treatment/Exercise - 08/20/20 0001      Lumbar Exercises: Aerobic   Nustep L5 UE/LE x 5 minutes       Lumbar Exercises: Standing   Row Both;20 reps    Theraband Level (Row) Level 4 (Blue)    Shoulder Extension Both;20 reps    Theraband Level (Shoulder Extension) Level 4 (Blue)    Other Standing Lumbar Exercises Punches blue band x 20      Lumbar Exercises: Supine   Bent Knee Raise 15 reps    Dead Bug 10 reps    Dead Bug Limitations RT/LT   2# in each hand    Bridge Limitations shoulder bridge per HEP x 15    Bridge with [employer] 20 reps    Bridge with [employer] Limitations short lift with gluteal set     Bridge with clamshell 20 reps    Bridge with [employer] Limitations blue band short lift with glut set.       Lumbar Exercises: Sidelying   Clam Right;Left;20 reps;2 seconds    Clam Limitations blue band                   PT Education - 08/20/20 0922    Education Details HEP    Person(s) Educated Patient    Methods Explanation;Demonstration;Verbal cues;Handout    Comprehension Verbalized understanding;Returned demonstration            PT Short Term Goals - 08/06/20 1017      PT SHORT TERM GOAL #2   Title She will report 25% or more easing of tightness in lower back after sitting at work    Status Achieved             PT Long Term Goals - 08/20/20 0924      PT LONG TERM GOAL #1   Title She will be independent with all HEP issued    Status Achieved      PT LONG TERM GOAL #2   Title She will report 50% easing of tightness in lower back with working in [employer]    Status Achieved      PT [place] #3   Title she will be able  to walk to mailbox with no tightness in lower back    Baseline improved but tightess on way back    Status Partially Met      PT LONG TERM GOAL #4   Title FOTO score will iimprove 10 points to 64%    Baseline 65%    Status Achieved                 Plan - 08/20/20 0833    Clinical Impression Statement FOTO score met and exceeded by 1 point in 3 less visit.  She reports greater ease walking steps fro garage now. Encouraged her to rotate exercises if not doing sll at one time.   She wanted to hold for 4 weeks and oif needed she would call to set up appointment , other wise discharge.    PT Treatment/Interventions Patient/family education;Therapeutic exercise    PT Next Visit Plan review and progress HEP  core strength addition    PT Home Exercise Plan PPT stretch piriformis, SKC , LTR, seated sidebend ,seated rotation  Pilates ab prep, scissor L1-2, hip twist L1 ,  Green/blue band row/extension /punches, wall PPT with bilateral reaching,, Pilates saw and trunk rotaiton seated,  dead bug,  clams and reverse clams    Consulted and Agree with Plan of Care Patient           Patient will benefit from skilled therapeutic intervention in order  to improve the following deficits and impairments:  Pain, Decreased activity tolerance, Decreased range of motion, Decreased strength, Postural dysfunction  Visit Diagnosis: Chronic bilateral low back pain without sciatica  Joint stiffness of spine  Abnormal posture     Problem List There are no problems to display for this patient.   [name]  PT 08/20/2020, 9:25 AM  [facility] [address] [place], [place], [address] Phone: [phone]   Fax:  [phone]  Name: Becky Dudley MRN: [id number] Date of Birth: November 13, 1950  PHYSICAL THERAPY DISCHARGE SUMMARY  Visits from Start of Care: 7  Current functional level related to goals / functional outcomes: See above   Remaining deficits: See above   Education / Equipment: HEP Plan: Patient agrees to discharge.  Patient goals were met. Patient is being discharged due to meeting the stated rehab goals.  ?????  And pleased with progress.   Pearson Forster PT   09/19/20

## 2021-04-19 ENCOUNTER — Emergency Department (HOSPITAL_BASED_OUTPATIENT_CLINIC_OR_DEPARTMENT_OTHER): Payer: 59 | Admitting: Radiology

## 2021-04-19 ENCOUNTER — Encounter (HOSPITAL_BASED_OUTPATIENT_CLINIC_OR_DEPARTMENT_OTHER): Payer: Self-pay

## 2021-04-19 ENCOUNTER — Other Ambulatory Visit: Payer: Self-pay

## 2021-04-19 ENCOUNTER — Emergency Department (HOSPITAL_BASED_OUTPATIENT_CLINIC_OR_DEPARTMENT_OTHER)
Admission: EM | Admit: 2021-04-19 | Discharge: 2021-04-19 | Disposition: A | Discharge status: Home / Self Care | Payer: 59 | Attending: Emergency Medicine | Admitting: Emergency Medicine

## 2021-04-19 DIAGNOSIS — Z7982 Long term (current) use of aspirin: Secondary | ICD-10-CM | POA: Diagnosis not present

## 2021-04-19 DIAGNOSIS — R0602 Shortness of breath: Secondary | ICD-10-CM | POA: Insufficient documentation

## 2021-04-19 MED ORDER — ALBUTEROL SULFATE HFA 108 (90 BASE) MCG/ACT IN AERS: 2.0000 | INHALATION_SPRAY | RESPIRATORY_TRACT | Status: DC | PRN

## 2021-04-19 NOTE — Discharge Instructions (Addendum)
Call your primary care doctor or specialist as discussed in the next 2-3 days.   Return immediately back to the ER if:  Your symptoms worsen within the next 12-24 hours. You develop new symptoms such as new fevers, persistent vomiting, new pain, shortness of breath, or new weakness or numbness, or if you have any other concerns.  

## 2021-04-19 NOTE — ED Triage Notes (Signed)
Patient here POV from Home with SOB.  Patient was taking a [employer] with Bath Salts approximately 2 Hours ago when SOB began. SOB worsened shortly PTA. No Medications PTA.  No Hx of Asthma or COPD, HX of HTN.   Ambulatory, GCS 15.

## 2021-04-19 NOTE — ED Provider Notes (Signed)
[place] EMERGENCY DEPT Provider Note   CSN: [id number] Arrival date & time: 04/19/21  0101     History Chief Complaint  Patient presents with   Shortness of Breath    Becky Dudley is a 70 y.o. female.  Patient presents chief complaint of shortness of breath.  She states that she was fine today.  She had taken a bath and was doing well sat down at the couch to watch TV when she had a coughing fit and then developed shortness of breath that lasted about 20 minutes.  She states by the time she arrived in the ER she felt fine and symptoms are resolved.  Denies any chest pain or chest discomfort.  Denies any fevers or vomiting or diarrhea.      History reviewed. No pertinent past medical history.  There are no problems to display for this patient.   History reviewed. No pertinent surgical history.   OB History   No obstetric history on file.     Family History  Problem Relation Age of Onset   Breast cancer Neg Hx     Social History   Tobacco Use   Smoking status: Never   Smokeless tobacco: Never  Substance Use Topics   Alcohol use: Never   Drug use: Never    Home Medications Prior to Admission medications   Medication Sig Start Date End Date Taking? Authorizing Provider  acetaminophen (TYLENOL) 325 MG tablet Take 650 mg by mouth every 6 (six) hours as needed.    [provider]  amlodipine-atorvastatin (CADUET) 10-10 MG tablet Take 1 tablet by mouth daily.    [provider]  aspirin 81 MG chewable tablet Chew by mouth daily.    [provider]  calcium carbonate (OS-CAL) 1250 (500 Ca) MG chewable tablet Chew 1 tablet by mouth daily.    [provider]  diclofenac (VOLTAREN) 0.1 % ophthalmic solution 4 (four) times daily.    [provider]  hydrochlorothiazide (MICROZIDE) 12.5 MG capsule Take 12.5 mg by mouth daily.    [provider]  loratadine (CLARITIN) 10 MG tablet Take 10 mg by mouth  daily.    [provider]  telmisartan (MICARDIS) 40 MG tablet Take 40 mg by mouth daily.    [provider]  zinc gluconate 50 MG tablet Take 50 mg by mouth daily.    [provider]    Allergies    Patient has no allergy information on record.  Review of Systems   Review of Systems  Constitutional:  Negative for fever.  HENT:  Negative for ear pain.   Eyes:  Negative for pain.  Respiratory:  Positive for cough and shortness of breath.   Cardiovascular:  Negative for chest pain.  Gastrointestinal:  Negative for abdominal pain.  Genitourinary:  Negative for flank pain.  Musculoskeletal:  Negative for back pain.  Skin:  Negative for rash.  Neurological:  Negative for headaches.   Physical Exam Updated Vital Signs BP 110/84   Pulse 87   Temp 97.7 F (36.5 C) (Oral)   Resp (!) 21   Ht 5\' 4"  (1.626 m)   Wt 104.3 kg   SpO2 98%   BMI 39.48 kg/m   Physical Exam Constitutional:      General: She is not in acute distress.    Appearance: Normal appearance.  HENT:     Head: Normocephalic.     Nose: Nose normal.  Eyes:     Extraocular Movements: Extraocular movements  intact.  Cardiovascular:     Rate and Rhythm: Normal rate.  Pulmonary:     Effort: Pulmonary effort is normal.  Musculoskeletal:        General: Normal range of motion.     Cervical back: Normal range of motion.  Neurological:     General: No focal deficit present.     Mental Status: She is alert. Mental status is at baseline.    ED Results / Procedures / Treatments   Labs (all labs ordered are listed, but only abnormal results are displayed) Labs Reviewed - No data to display  EKG EKG Interpretation  Date/Time:  Friday April 19 2021 02:58:50 EDT Ventricular Rate:  81 PR Interval:  157 QRS Duration: 114 QT Interval:  344 QTC Calculation: 400 R Axis:   28 Text Interpretation: Sinus rhythm Borderline intraventricular conduction delay Borderline T abnormalities, anterior  leads Confirmed by Thamas Jaegers ([id number]) on 04/19/2021 3:12:16 AM  Radiology DG Chest Port 1 View  Result Date: 04/19/2021 CLINICAL DATA:  Shortness of breath for 1 day EXAM: PORTABLE CHEST 1 VIEW COMPARISON:  None. FINDINGS: Heart size and pulmonary vascularity are normal. Probable emphysematous changes in the lungs. No airspace disease or consolidation. No pleural effusions. No pneumothorax. Mediastinal contours appear intact. Calcified and tortuous aorta. 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 No evidence of active pulmonary disease. Electronically Signed   By: Lucienne Capers M.D.   On: 04/19/2021 01:47    Procedures Procedures   Medications Ordered in ED Medications - No data to display  ED Course  I have reviewed the triage vital signs and the nursing notes.  Pertinent labs & imaging results that were available during my care of the patient were reviewed by me and considered in my medical decision making (see chart for details).    MDM Rules/Calculators/A&P                          EKG shows sinus rhythm.  No ST elevations noted.  Rate is normal.  Chest x-ray shows no infiltrate effusion or edema.  Patient remains asymptomatic here observed for some time with no additional episodes.  Discharged home in stable condition.  Final Clinical Impression(s) / ED Diagnoses Final diagnoses:  Shortness of breath    Rx / DC Orders ED Discharge Orders     None        Luna Fuse, MD 04/19/21 [phone]

## 2021-07-30 ENCOUNTER — Other Ambulatory Visit: Payer: Self-pay | Admitting: Family Medicine

## 2021-07-30 DIAGNOSIS — Z1231 Encounter for screening mammogram for malignant neoplasm of breast: Secondary | ICD-10-CM

## 2021-08-02 ENCOUNTER — Ambulatory Visit
Admission: RE | Admit: 2021-08-02 | Discharge: 2021-08-02 | Disposition: A | Discharge status: Home / Self Care | Payer: 59 | Source: Ambulatory Visit | Attending: Family Medicine | Admitting: Family Medicine

## 2021-08-02 ENCOUNTER — Other Ambulatory Visit: Payer: Self-pay

## 2021-08-02 DIAGNOSIS — Z1231 Encounter for screening mammogram for malignant neoplasm of breast: Secondary | ICD-10-CM

## 2021-08-07 ENCOUNTER — Other Ambulatory Visit: Payer: Self-pay | Admitting: Family Medicine

## 2021-08-07 DIAGNOSIS — R928 Other abnormal and inconclusive findings on diagnostic imaging of breast: Secondary | ICD-10-CM

## 2021-08-17 ENCOUNTER — Ambulatory Visit
Admission: RE | Admit: 2021-08-17 | Discharge: 2021-08-17 | Disposition: A | Discharge status: Home / Self Care | Payer: 59 | Source: Ambulatory Visit | Attending: Family Medicine | Admitting: Family Medicine

## 2021-08-17 ENCOUNTER — Other Ambulatory Visit: Payer: Self-pay | Admitting: Family Medicine

## 2021-08-17 DIAGNOSIS — R928 Other abnormal and inconclusive findings on diagnostic imaging of breast: Secondary | ICD-10-CM

## 2021-08-17 DIAGNOSIS — R921 Mammographic calcification found on diagnostic imaging of breast: Secondary | ICD-10-CM

## 2021-09-12 DIAGNOSIS — C801 Malignant (primary) neoplasm, unspecified: Secondary | ICD-10-CM

## 2021-09-12 HISTORY — DX: Malignant (primary) neoplasm, unspecified: C80.1

## 2021-09-24 ENCOUNTER — Encounter (HOSPITAL_BASED_OUTPATIENT_CLINIC_OR_DEPARTMENT_OTHER): Payer: Self-pay | Admitting: Obstetrics and Gynecology

## 2021-09-24 ENCOUNTER — Other Ambulatory Visit: Payer: Self-pay

## 2021-09-24 NOTE — Progress Notes (Signed)
Spoke w/ via phone for pre-op interview---pt Lab needs dos----ISTAT per anesthesia, surgeon orders pending, requested orders from Dr. Ronita Hipps via Epic IB on 09/24/21               Lab results------04/19/2021 EKG & Cxray in [place] test -----patient states asymptomatic no test needed Arrive at -------0720 on 09/30/21 NPO after MN NO Solid Food.  Clear liquids from MN until---0620 Med rec completed Medications to take morning of surgery -----Claritin prn Diabetic medication -----Daisey Must (Patient takes weekly on Sundays) Patient instructed no nail polish to be worn day of surgery Patient instructed to bring photo id and insurance card day of surgery Patient aware to have Driver (ride ) / caregiver    for 24 hours after surgery - Driver/caregiver will be sister [place] or granddaughter (age 67) [place] Patient Special Instructions -----none Pre-Op special Istructions -----none Patient verbalized understanding of instructions that were given at this phone interview. Patient denies shortness of breath, chest pain, fever, cough at this phone interview.   On 04/19/2021 patient had an episode of SOB after coughing. She was seen in the ER. Chest xray and EKG were normal ( in chart & Epic). Symptoms resolved quickly. Patient was discharged home. She currently denies any episodes of SOB.

## 2021-09-27 ENCOUNTER — Other Ambulatory Visit: Payer: Self-pay | Admitting: Obstetrics and Gynecology

## 2021-09-30 ENCOUNTER — Ambulatory Visit (HOSPITAL_BASED_OUTPATIENT_CLINIC_OR_DEPARTMENT_OTHER): Payer: 59 | Admitting: Anesthesiology

## 2021-09-30 ENCOUNTER — Ambulatory Visit (HOSPITAL_BASED_OUTPATIENT_CLINIC_OR_DEPARTMENT_OTHER)
Admission: RE | Admit: 2021-09-30 | Discharge: 2021-09-30 | Disposition: A | Discharge status: Home / Self Care | Payer: 59 | Attending: Obstetrics and Gynecology | Admitting: Obstetrics and Gynecology

## 2021-09-30 ENCOUNTER — Encounter (HOSPITAL_BASED_OUTPATIENT_CLINIC_OR_DEPARTMENT_OTHER): Payer: Self-pay | Admitting: Obstetrics and Gynecology

## 2021-09-30 ENCOUNTER — Encounter (HOSPITAL_BASED_OUTPATIENT_CLINIC_OR_DEPARTMENT_OTHER)
Admission: RE | Disposition: A | Discharge status: Home / Self Care | Payer: Self-pay | Source: Home / Self Care | Attending: Obstetrics and Gynecology

## 2021-09-30 DIAGNOSIS — E1122 Type 2 diabetes mellitus with diabetic chronic kidney disease: Secondary | ICD-10-CM | POA: Insufficient documentation

## 2021-09-30 DIAGNOSIS — C541 Malignant neoplasm of endometrium: Secondary | ICD-10-CM | POA: Insufficient documentation

## 2021-09-30 DIAGNOSIS — N183 Chronic kidney disease, stage 3 unspecified: Secondary | ICD-10-CM | POA: Insufficient documentation

## 2021-09-30 DIAGNOSIS — I129 Hypertensive chronic kidney disease with stage 1 through stage 4 chronic kidney disease, or unspecified chronic kidney disease: Secondary | ICD-10-CM | POA: Insufficient documentation

## 2021-09-30 DIAGNOSIS — N950 Postmenopausal bleeding: Secondary | ICD-10-CM | POA: Insufficient documentation

## 2021-09-30 HISTORY — DX: Presence of spectacles and contact lenses: Z97.3

## 2021-09-30 HISTORY — DX: Mixed hyperlipidemia: E78.2

## 2021-09-30 HISTORY — DX: Type 2 diabetes mellitus without complications: E11.9

## 2021-09-30 HISTORY — PX: DILATATION & CURETTAGE/HYSTEROSCOPY WITH MYOSURE: SHX6511

## 2021-09-30 HISTORY — DX: Pure hypercholesterolemia, unspecified: E78.00

## 2021-09-30 HISTORY — DX: Morbid (severe) obesity due to excess calories: E66.01

## 2021-09-30 HISTORY — DX: Essential (primary) hypertension: I10

## 2021-09-30 HISTORY — DX: Gout, unspecified: M10.9

## 2021-09-30 HISTORY — DX: Vitamin D deficiency, unspecified: E55.9

## 2021-09-30 HISTORY — DX: Unspecified osteoarthritis, unspecified site: M19.90

## 2021-09-30 HISTORY — DX: Chronic kidney disease, stage 3 unspecified: N18.30

## 2021-09-30 LAB — ABO/RH: ABO/RH(D): O POS

## 2021-09-30 LAB — GLUCOSE, CAPILLARY: Glucose-Capillary: 162 mg/dL — ABNORMAL HIGH (ref 70–99)

## 2021-09-30 LAB — TYPE AND SCREEN
ABO/RH(D): O POS
Antibody Screen: NEGATIVE

## 2021-09-30 LAB — POCT I-STAT, CHEM 8
BUN: 17 mg/dL (ref 8–23)
Calcium, Ion: 1.16 mmol/L (ref 1.15–1.40)
Chloride: 101 mmol/L (ref 98–111)
Creatinine, Ser: 1.2 mg/dL — ABNORMAL HIGH (ref 0.44–1.00)
Glucose, Bld: 124 mg/dL — ABNORMAL HIGH (ref 70–99)
HCT: 42 % (ref 36.0–46.0)
Hemoglobin: 14.3 g/dL (ref 12.0–15.0)
Potassium: 3.6 mmol/L (ref 3.5–5.1)
Sodium: 141 mmol/L (ref 135–145)
TCO2: 28 mmol/L (ref 22–32)

## 2021-09-30 SURGERY — DILATATION & CURETTAGE/HYSTEROSCOPY WITH MYOSURE
Anesthesia: General | Site: Uterus

## 2021-09-30 MED ORDER — ONDANSETRON HCL 4 MG/2ML IJ SOLN: 4.0000 mg | Freq: Once | INTRAMUSCULAR | Status: DC | PRN

## 2021-09-30 MED ORDER — ACETAMINOPHEN 500 MG PO TABS
1000.0000 mg | ORAL_TABLET | Freq: Once | ORAL | Status: AC
  Administered 2021-09-30: 08:00:00 1000 mg via ORAL

## 2021-09-30 MED ORDER — SODIUM CHLORIDE (PF) 0.9 % IJ SOLN
INTRAMUSCULAR | Status: DC | PRN
  Administered 2021-09-30: 18.62 mL

## 2021-09-30 MED ORDER — FENTANYL CITRATE (PF) 100 MCG/2ML IJ SOLN
INTRAMUSCULAR | Status: DC | PRN
  Administered 2021-09-30: 25 ug via INTRAVENOUS

## 2021-09-30 MED ORDER — DEXAMETHASONE SODIUM PHOSPHATE 10 MG/ML IJ SOLN
INTRAMUSCULAR | Status: AC
  Filled 2021-09-30: qty 1

## 2021-09-30 MED ORDER — FENTANYL CITRATE (PF) 100 MCG/2ML IJ SOLN: 25.0000 ug | INTRAMUSCULAR | Status: DC | PRN

## 2021-09-30 MED ORDER — DEXAMETHASONE SODIUM PHOSPHATE 10 MG/ML IJ SOLN
INTRAMUSCULAR | Status: DC | PRN
  Administered 2021-09-30: 4 mg via INTRAVENOUS

## 2021-09-30 MED ORDER — TRAMADOL HCL 50 MG PO TABS: 50.0000 mg | ORAL_TABLET | Freq: Four times a day (QID) | ORAL | 0 refills | Status: DC | PRN

## 2021-09-30 MED ORDER — CEFAZOLIN SODIUM-DEXTROSE 2-4 GM/100ML-% IV SOLN: 2.0000 g | INTRAVENOUS | Status: DC

## 2021-09-30 MED ORDER — SODIUM CHLORIDE 0.9 % IV SOLN: INTRAVENOUS | Status: DC

## 2021-09-30 MED ORDER — VASOPRESSIN 20 UNIT/ML IV SOLN
INTRAVENOUS | Status: DC | PRN
  Administered 2021-09-30: .4 [IU]

## 2021-09-30 MED ORDER — LIDOCAINE 2% (20 MG/ML) 5 ML SYRINGE
INTRAMUSCULAR | Status: AC
  Filled 2021-09-30: qty 5

## 2021-09-30 MED ORDER — EPHEDRINE SULFATE-NACL 50-0.9 MG/10ML-% IV SOSY
PREFILLED_SYRINGE | INTRAVENOUS | Status: DC | PRN
  Administered 2021-09-30: 10 mg via INTRAVENOUS

## 2021-09-30 MED ORDER — PHENYLEPHRINE 40 MCG/ML (10ML) SYRINGE FOR IV PUSH (FOR BLOOD PRESSURE SUPPORT)
PREFILLED_SYRINGE | INTRAVENOUS | Status: DC | PRN
  Administered 2021-09-30: 120 ug via INTRAVENOUS

## 2021-09-30 MED ORDER — ACETAMINOPHEN 500 MG PO TABS
ORAL_TABLET | ORAL | Status: AC
  Filled 2021-09-30: qty 2

## 2021-09-30 MED ORDER — LIDOCAINE 2% (20 MG/ML) 5 ML SYRINGE
INTRAMUSCULAR | Status: DC | PRN
  Administered 2021-09-30: 80 mg via INTRAVENOUS

## 2021-09-30 MED ORDER — SODIUM CHLORIDE 0.9 % IR SOLN
Status: DC | PRN
  Administered 2021-09-30 (×2): 3000 mL

## 2021-09-30 MED ORDER — ROCURONIUM BROMIDE 10 MG/ML (PF) SYRINGE
PREFILLED_SYRINGE | INTRAVENOUS | Status: AC
  Filled 2021-09-30: qty 10

## 2021-09-30 MED ORDER — PROPOFOL 10 MG/ML IV BOLUS
INTRAVENOUS | Status: AC
  Filled 2021-09-30: qty 20

## 2021-09-30 MED ORDER — CEFAZOLIN SODIUM-DEXTROSE 2-4 GM/100ML-% IV SOLN
INTRAVENOUS | Status: AC
  Filled 2021-09-30: qty 100

## 2021-09-30 MED ORDER — FENTANYL CITRATE (PF) 100 MCG/2ML IJ SOLN
INTRAMUSCULAR | Status: AC
  Filled 2021-09-30: qty 2

## 2021-09-30 MED ORDER — POVIDONE-IODINE 10 % EX SWAB: 2.0000 "application " | Freq: Once | CUTANEOUS | Status: DC

## 2021-09-30 MED ORDER — PROPOFOL 10 MG/ML IV BOLUS
INTRAVENOUS | Status: DC | PRN
  Administered 2021-09-30: 140 mg via INTRAVENOUS

## 2021-09-30 MED ORDER — ONDANSETRON HCL 4 MG/2ML IJ SOLN
INTRAMUSCULAR | Status: AC
  Filled 2021-09-30: qty 2

## 2021-09-30 MED ORDER — ONDANSETRON HCL 4 MG/2ML IJ SOLN
INTRAMUSCULAR | Status: DC | PRN
  Administered 2021-09-30: 4 mg via INTRAVENOUS

## 2021-09-30 MED ORDER — BUPIVACAINE HCL (PF) 0.25 % IJ SOLN
INTRAMUSCULAR | Status: DC | PRN
  Administered 2021-09-30: 20 mL

## 2021-09-30 SURGICAL SUPPLY — 20 items
CATH ROBINSON RED A/P 16FR (CATHETERS) ×3 IMPLANT
DECANTER SPIKE VIAL GLASS SM (MISCELLANEOUS) ×6 IMPLANT
DEVICE MYOSURE LITE (MISCELLANEOUS) IMPLANT
DEVICE MYOSURE REACH (MISCELLANEOUS) ×2 IMPLANT
DRSG TELFA 3X8 NADH (GAUZE/BANDAGES/DRESSINGS) ×3 IMPLANT
GAUZE 4X4 16PLY ~~LOC~~+RFID DBL (SPONGE) ×3 IMPLANT
GLOVE SURG ENC MOIS LTX SZ7.5 (GLOVE) ×3 IMPLANT
GLOVE SURG UNDER POLY LF SZ7 (GLOVE) ×3 IMPLANT
GOWN STRL REUS W/TWL LRG LVL3 (GOWN DISPOSABLE) ×6 IMPLANT
IV NS IRRIG 3000ML ARTHROMATIC (IV SOLUTION) ×5 IMPLANT
KIT PROCEDURE FLUENT (KITS) ×3 IMPLANT
KIT TURNOVER CYSTO (KITS) ×3 IMPLANT
NDL SPNL 22GX3.5 QUINCKE BK (NEEDLE) ×1 IMPLANT
NEEDLE SPNL 22GX3.5 QUINCKE BK (NEEDLE) ×3 IMPLANT
PACK VAGINAL MINOR WOMEN LF (CUSTOM PROCEDURE TRAY) ×3 IMPLANT
PAD DRESSING TELFA 3X8 NADH (GAUZE/BANDAGES/DRESSINGS) IMPLANT
PAD OB MATERNITY 4.3X12.25 (PERSONAL CARE ITEMS) ×3 IMPLANT
SEAL CERVICAL OMNI LOK (ABLATOR) IMPLANT
SEAL ROD LENS SCOPE MYOSURE (ABLATOR) ×3 IMPLANT
SYR 10ML LL (SYRINGE) ×2 IMPLANT

## 2021-09-30 NOTE — Op Note (Signed)
NAME: Becky Dudley, Becky Dudley MEDICAL RECORD NO: [id number] ACCOUNT NO: [id number] DATE OF BIRTH: October 25, 1950 FACILITY: [facility] LOCATION: [facility] PHYSICIAN: Lovenia Kim, MD  Operative Report   DATE OF PROCEDURE: 09/30/2021  PREOPERATIVE DIAGNOSIS:  Postmenopausal bleeding with structural lesion by saline sonohysterogram.  POSTOPERATIVE DIAGNOSIS:  Postmenopausal bleeding with structural lesion by saline sonohysterogram.  PROCEDURE:  Diagnostic hysteroscopy, D and C, MyoSure resection of anterior wall endometrial polyp(incomplete).  SURGEON:  Lovenia Kim, MD  ASSISTANT:  None.  ANESTHESIA:  Local and general.  ESTIMATED BLOOD LOSS:  25 mL.  FLUID DEFICIT:  300 mL.  COMPLICATIONS:  None.  DRAINS:  None.  COUNTS:  Correct.  DISPOSITION:  The patient was taken to recovery in good condition.  SPECIMENS:  Endometrial curettings and endometrial polypoid fragments.  BRIEF DESCRIPTION OF PROCEDURE:  After being apprised of the risks of anesthesia, infection, bleeding, injury to surrounding organs, possible need for repair delayed risks versus immediate complications to include bowel and bladder injury, possible need  for repair, inability to cure cancer by removal or possible cancer by removal of polyp.  The patient was brought to the operating room where she was administered a general anesthetic without complications, prepped and draped in usual sterile fashion and  catheterized.  Her bladder was emptied.  Exam under anesthesia reveals a bulky mid position to anteflexed uterus and no adnexal masses.  Dilute Pitressin solution placed at 3 and 9 o'clock. 19 mL total of dilute Marcaine solution placed, 20 mL total  standard paracervical block.  Cervix was easily dilated up to #19 [facility] dilator.  Hysteroscope placed.  Visualization reveals a large previously noted by saline sonohysterogram anterior wall polyp, which occupies the majority of the endometrial cavity.   At this time,  the MyoSure Reach device was entered for incomplete resection of this anterior wall polyp.  Upon resection of what appears to be about 70% of the lesion, there is inability to insufflate the cavity appropriately.  The MyoSure device was  removed.  Outflow tract was replaced.  Pressure was increased to 110, but no complete visualization is achieved to complete resection. D and C was performed in a 4-quadrant method using sharp curettage.  Fluid deficit 300 mL noted.  Instruments were  removed.  Minimal bleeding noted.  The patient tolerated the procedure well, was awakened and transferred to recovery in good condition.   NIK D: 09/30/2021 10:18:31 am T: 09/30/2021 11:41:00 pm  JOB: [id number]/ [id number]

## 2021-09-30 NOTE — Op Note (Signed)
09/30/2021  10:14 AM  PATIENT:  Becky Dudley  70 y.o. female  PRE-OPERATIVE DIAGNOSIS:  Postmenopausal Bleeding  POST-OPERATIVE DIAGNOSIS:  Postmenopausal Bleeding  PROCEDURE:  Procedure(s): DILATATION & CURETTAGE HYSTEROSCOPY WITH MYOSURE  SURGEON:  Surgeon(s): Brien Few, MD  ASSISTANTS: none   ANESTHESIA:   local and general  ESTIMATED BLOOD LOSS: 25 mL   DRAINS: none   LOCAL MEDICATIONS USED:  MARCAINE    and Amount: 20 ml  SPECIMEN:  Source of Specimen:  EMC and endometrial polyp  DISPOSITION OF SPECIMEN:  PATHOLOGY  COUNTS:  YES  DICTATION #: [id number]  PLAN OF CARE: dc home  PATIENT DISPOSITION:  PACU - hemodynamically stable.

## 2021-09-30 NOTE — Discharge Instructions (Addendum)
DISCHARGE INSTRUCTIONS: D&C / D&E The following instructions have been prepared to help you care for yourself upon your return home.   Personal hygiene:  Use sanitary pads for vaginal drainage, not tampons.  Shower the day after your procedure.  NO tub baths, pools or Jacuzzis for 2-3 weeks.  Wipe front to back after using the bathroom.  Activity and limitations:  Do NOT drive or operate any equipment for 24 hours. The effects of anesthesia are still present and drowsiness may result.  Do NOT rest in bed all day.  Walking is encouraged.  Walk up and down stairs slowly.  You may resume your normal activity in one to two days or as indicated by your physician.  Sexual activity: NO intercourse for at least 2 weeks after the procedure, or as indicated by your physician.  Diet: Eat a light meal as desired this evening. You may resume your usual diet tomorrow.  Return to work: You may resume your work activities in one to two days or as indicated by your doctor.  What to expect after your surgery: Expect to have vaginal bleeding/discharge for 2-3 days and spotting for up to 10 days. It is not unusual to have soreness for up to 1-2 weeks. You may have a slight burning sensation when you urinate for the first day. Mild cramps may continue for a couple of days. You may have a regular period in 2-6 weeks.  Call your doctor for any of the following:  Excessive vaginal bleeding, saturating and changing one pad every hour.  Inability to urinate 6 hours after discharge from hospital.  Pain not relieved by pain medication.  Fever of 100.4 F or greater.  Unusual vaginal discharge or odor.   Post Anesthesia Home Care Instructions  Activity: Get plenty of rest for the remainder of the day. A responsible individual must stay with you for 24 hours following the procedure.  For the next 24 hours, DO NOT: -Drive a car -Paediatric nurse -Drink alcoholic beverages -Take any medication unless  instructed by your physician -Make any legal decisions or sign important papers.  Meals: Start with liquid foods such as gelatin or soup. Progress to regular foods as tolerated. Avoid greasy, spicy, heavy foods. If nausea and/or vomiting occur, drink only clear liquids until the nausea and/or vomiting subsides. Call your physician if vomiting continues.  Special Instructions/Symptoms: Your throat may feel dry or sore from the anesthesia or the breathing tube placed in your throat during surgery. If this causes discomfort, gargle with warm salt water. The discomfort should disappear within 24 hours.  No acetaminophen/Tylenol until after 2:00 pm today if needed.

## 2021-09-30 NOTE — Transfer of Care (Signed)
Immediate Anesthesia Transfer of Care Note  Patient: Becky Dudley  Procedure(s) Performed: DILATATION & CURETTAGE/HYSTEROSCOPY WITH MYOSURE (Uterus)  Patient Location: PACU  Anesthesia Type:General  Level of Consciousness: awake, alert  and oriented  Airway & Oxygen Therapy: Patient Spontanous Breathing and Patient connected to nasal cannula oxygen  Post-op Assessment: Report given to RN  Post vital signs: Reviewed and stable  Last Vitals:  Vitals Value Taken Time  BP 113/68 09/30/21 1026  Temp    Pulse 68 09/30/21 1027  Resp 16 09/30/21 1027  SpO2 94 % 09/30/21 1027  Vitals shown include unvalidated device data.  Last Pain:  Vitals:   09/30/21 0753  TempSrc: Oral  PainSc: 0-No pain      Patients Stated Pain Goal: 5 (41/96/22 2979)  Complications: No notable events documented.

## 2021-09-30 NOTE — H&P (Signed)
Becky Dudley is an 70 y.o. female. PMB for surgical evaluation  Pertinent Gynecological History: Menses: post-menopausal Bleeding: post menopausal bleeding Contraception: none DES exposure: denies Blood transfusions: none Sexually transmitted diseases: no past history Previous GYN Procedures: DNC  Last mammogram: normal Date: 2022 Last pap: normal Date: 2022 OB History: G2, P1   Menstrual History: Menarche age: 64 No LMP recorded. Patient is postmenopausal.    Past Medical History:  Diagnosis Date   Arthritis    both knees   CKD (chronic kidney disease), stage III ([place])    Diabetes mellitus without complication ([place])    DM2   Essential hypertension    Gout    Mixed hyperlipidemia    Morbid obesity ([place])    Pure hypercholesterolemia    Vitamin D deficiency    Wears glasses     Past Surgical History:  Procedure Laterality Date   CHOLECYSTECTOMY  1989   EYE SURGERY     cataract surgery, lens implant on right eye   LASIK Bilateral 2002   TONSILLECTOMY  1953    Family History  Problem Relation Age of Onset   Breast cancer Neg Hx     Social History:  reports that she has never smoked. She has never used smokeless tobacco. She reports that she does not drink alcohol and does not use drugs.  Allergies:  Allergies  Allergen Reactions   Lisinopril Cough    No medications prior to admission.    Review of Systems  Constitutional: Negative.   All other systems reviewed and are negative.  Height 5' 4.5" (1.638 m), weight 95.3 kg. Physical Exam Constitutional:      Appearance: Normal appearance. She is obese.  HENT:     Head: Normocephalic and atraumatic.  Cardiovascular:     Rate and Rhythm: Normal rate and regular rhythm.     Pulses: Normal pulses.     Heart sounds: Normal heart sounds.  Pulmonary:     Effort: Pulmonary effort is normal.     Breath sounds: Normal breath sounds.  Abdominal:     General: Bowel sounds are normal.     Palpations:  Abdomen is soft.  Genitourinary:    General: Normal vulva.  Musculoskeletal:        General: Normal range of motion.     Cervical back: Normal range of motion and neck supple.  Skin:    General: Skin is warm and dry.  Neurological:     General: No focal deficit present.     Mental Status: She is alert and oriented to person, place, and time.  Psychiatric:        Mood and Affect: Mood normal.        Behavior: Behavior normal.    No results found for this or any previous visit (from the past 24 hour(s)).  No results found.  Assessment/Plan: PMB with structural defect Diag HS with D&C and myosure Surgical risks discussed. Consent done  Becky Dudley J 09/30/2021, 6:33 AM

## 2021-09-30 NOTE — Anesthesia Postprocedure Evaluation (Signed)
Anesthesia Post Note  Patient: Becky Dudley  Procedure(s) Performed: DILATATION & CURETTAGE/HYSTEROSCOPY WITH MYOSURE (Uterus)     Patient location during evaluation: PACU Anesthesia Type: General Level of consciousness: awake and alert Pain management: pain level controlled Vital Signs Assessment: post-procedure vital signs reviewed and stable Respiratory status: spontaneous breathing, nonlabored ventilation and respiratory function stable Cardiovascular status: blood pressure returned to baseline and stable Postop Assessment: no apparent nausea or vomiting Anesthetic complications: no   No notable events documented.  Last Vitals:  Vitals:   09/30/21 1045 09/30/21 1123  BP: 105/65 116/65  Pulse: 63 66  Resp: 12 14  Temp:  (!) 36.3 C  SpO2: 94% 95%    Last Pain:  Vitals:   09/30/21 1045  TempSrc:   PainSc: 3                  Catalina Gravel

## 2021-09-30 NOTE — Anesthesia Preprocedure Evaluation (Addendum)
Anesthesia Evaluation  Patient identified by MRN, date of birth, ID band Patient awake    Reviewed: Allergy & Precautions, NPO status , Patient's Chart, lab work & pertinent test results  Airway Mallampati: II  TM Distance: >3 FB Neck ROM: Full    Dental  (+) Teeth Intact, Dental Advisory Given   Pulmonary neg pulmonary ROS,    Pulmonary exam normal breath sounds clear to auscultation       Cardiovascular hypertension, Pt. on medications Normal cardiovascular exam Rhythm:Regular Rate:Normal     Neuro/Psych negative neurological ROS     GI/Hepatic negative GI ROS, Neg liver ROS,   Endo/Other  diabetes, Type 2Obesity   Renal/GU Renal InsufficiencyRenal disease     Musculoskeletal  (+) Arthritis ,   Abdominal   Peds  Hematology negative hematology ROS (+)   Anesthesia Other Findings Day of surgery medications reviewed with the patient.  Reproductive/Obstetrics Postmenopausal Bleeding                            Anesthesia Physical Anesthesia Plan  ASA: 3  Anesthesia Plan: General   Post-op Pain Management: Tylenol PO (pre-op)   Induction: Intravenous  PONV Risk Score and Plan: 4 or greater and Dexamethasone and Ondansetron  Airway Management Planned: LMA  Additional Equipment:   Intra-op Plan:   Post-operative Plan: Extubation in OR  Informed Consent: I have reviewed the patients History and Physical, chart, labs and discussed the procedure including the risks, benefits and alternatives for the proposed anesthesia with the patient or authorized representative who has indicated his/her understanding and acceptance.     Dental advisory given  Plan Discussed with: CRNA  Anesthesia Plan Comments:         Anesthesia Quick Evaluation

## 2021-09-30 NOTE — Anesthesia Procedure Notes (Signed)
Procedure Name: LMA Insertion Date/Time: 09/30/2021 9:39 AM Performed by: Bonney Aid, CRNA Pre-anesthesia Checklist: Patient identified, Emergency Drugs available, Suction available and Patient being monitored Patient Re-evaluated:Patient Re-evaluated prior to induction Oxygen Delivery Method: Circle system utilized Preoxygenation: Pre-oxygenation with 100% oxygen Induction Type: IV induction Ventilation: Mask ventilation without difficulty LMA: LMA inserted LMA Size: 4.0 Number of attempts: 1 Airway Equipment and Method: Bite block Placement Confirmation: positive ETCO2 Tube secured with: Tape Dental Injury: Teeth and Oropharynx as per pre-operative assessment

## 2021-10-01 ENCOUNTER — Encounter (HOSPITAL_BASED_OUTPATIENT_CLINIC_OR_DEPARTMENT_OTHER): Payer: Self-pay | Admitting: Obstetrics and Gynecology

## 2021-10-02 LAB — SURGICAL PATHOLOGY

## 2021-10-03 ENCOUNTER — Telehealth: Payer: Self-pay | Admitting: *Deleted

## 2021-10-03 ENCOUNTER — Encounter: Payer: Self-pay | Admitting: Gynecologic Oncology

## 2021-10-03 NOTE — Telephone Encounter (Signed)
Spoke with the patient and scheduled a new patient appt with Dr Berline Lopes for 12/23 at 8 am; patient given an arrival time of 7:30 am to 7:45 am. Patient also given the address and phone number for the clinic; along with the policy for mask and visitors

## 2021-10-04 ENCOUNTER — Inpatient Hospital Stay (HOSPITAL_BASED_OUTPATIENT_CLINIC_OR_DEPARTMENT_OTHER): Payer: 59 | Admitting: Gynecologic Oncology

## 2021-10-04 ENCOUNTER — Encounter: Payer: Self-pay | Admitting: Gynecologic Oncology

## 2021-10-04 ENCOUNTER — Other Ambulatory Visit: Payer: Self-pay

## 2021-10-04 ENCOUNTER — Inpatient Hospital Stay: Payer: 59 | Attending: Gynecologic Oncology | Admitting: Gynecologic Oncology

## 2021-10-04 VITALS — BP 107/53 | HR 72 | Temp 97.7°F | Resp 16 | Ht 64.0 in | Wt 213.8 lb

## 2021-10-04 DIAGNOSIS — E559 Vitamin D deficiency, unspecified: Secondary | ICD-10-CM | POA: Insufficient documentation

## 2021-10-04 DIAGNOSIS — Z6836 Body mass index (BMI) 36.0-36.9, adult: Secondary | ICD-10-CM | POA: Insufficient documentation

## 2021-10-04 DIAGNOSIS — M109 Gout, unspecified: Secondary | ICD-10-CM | POA: Insufficient documentation

## 2021-10-04 DIAGNOSIS — E119 Type 2 diabetes mellitus without complications: Secondary | ICD-10-CM | POA: Insufficient documentation

## 2021-10-04 DIAGNOSIS — E7800 Pure hypercholesterolemia, unspecified: Secondary | ICD-10-CM | POA: Insufficient documentation

## 2021-10-04 DIAGNOSIS — Z7985 Long-term (current) use of injectable non-insulin antidiabetic drugs: Secondary | ICD-10-CM | POA: Insufficient documentation

## 2021-10-04 DIAGNOSIS — Z79899 Other long term (current) drug therapy: Secondary | ICD-10-CM | POA: Diagnosis not present

## 2021-10-04 DIAGNOSIS — C541 Malignant neoplasm of endometrium: Secondary | ICD-10-CM | POA: Insufficient documentation

## 2021-10-04 DIAGNOSIS — M1990 Unspecified osteoarthritis, unspecified site: Secondary | ICD-10-CM | POA: Diagnosis not present

## 2021-10-04 DIAGNOSIS — I10 Essential (primary) hypertension: Secondary | ICD-10-CM | POA: Diagnosis not present

## 2021-10-04 DIAGNOSIS — N183 Chronic kidney disease, stage 3 unspecified: Secondary | ICD-10-CM | POA: Diagnosis not present

## 2021-10-04 DIAGNOSIS — E785 Hyperlipidemia, unspecified: Secondary | ICD-10-CM

## 2021-10-04 DIAGNOSIS — I129 Hypertensive chronic kidney disease with stage 1 through stage 4 chronic kidney disease, or unspecified chronic kidney disease: Secondary | ICD-10-CM | POA: Diagnosis not present

## 2021-10-04 MED ORDER — SENNOSIDES-DOCUSATE SODIUM 8.6-50 MG PO TABS: 2.0000 | ORAL_TABLET | Freq: Every day | ORAL | 0 refills | Status: DC

## 2021-10-04 NOTE — Patient Instructions (Signed)
Plan to have a CT scan before surgery and Dr. Berline Lopes will notify you of the results.    Preparing for your Surgery   Plan for surgery on October 10, 2021 with Dr. Jeral Pinch at [place] will be scheduled for robotic assisted total laparoscopic hysterectomy (removal of the uterus and cervix), bilateral salpingo-oophorectomy (removal of both ovaries and fallopian tubes), sentinel lymph node biopsy, possible lymph node dissection, possible laparotomy (larger incision on your abdomen if needed).    Pre-operative Testing -You will receive a phone call from presurgical testing at [facility] to arrange for a pre-operative appointment and lab work.   -Bring your insurance card, copy of an advanced directive if applicable, medication list   -At that visit, you will be asked to sign a consent for a possible blood transfusion in case a transfusion becomes necessary during surgery.  The need for a blood transfusion is rare but having consent is a necessary part of your care.      -You are fine to keep taking your aspirin 81 mg before surgery with your last dose being the day before surgery.   -Do not take supplements such as fish oil (omega 3), red yeast rice, turmeric before your surgery. You want to avoid medications with aspirin in them including headache powders such as BC or Goody's), Excedrin migraine.   Day Before Surgery at [place] will be asked to take in a light diet the day before surgery. You will be advised you can have clear liquids up until 3 hours before your surgery.     Eat a light diet the day before surgery.  Examples including soups, broths, toast, yogurt, mashed potatoes.  AVOID GAS PRODUCING FOODS. Things to avoid include carbonated beverages (fizzy beverages, sodas), raw fruits and raw vegetables (uncooked), or beans.    If your bowels are filled with gas, your surgeon will have difficulty visualizing your pelvic organs which increases your  surgical risks.   Your role in recovery Your role is to become active as soon as directed by your doctor, while still giving yourself time to heal.  Rest when you feel tired. You will be asked to do the following in order to speed your recovery:   - Cough and breathe deeply. This helps to clear and expand your lungs and can prevent pneumonia after surgery.  - [place]. Do mild physical activity. Walking or moving your legs help your circulation and body functions return to normal. Do not try to get up or walk alone the first time after surgery.   -If you develop swelling on one leg or the other, pain in the back of your leg, redness/warmth in one of your legs, please call the office or go to the Emergency Room to have a doppler to rule out a blood clot. For shortness of breath, chest pain-seek care in the Emergency Room as soon as possible. - Actively manage your pain. Managing your pain lets you move in comfort. We will ask you to rate your pain on a scale of zero to 10. It is your responsibility to tell your doctor or nurse where and how much you hurt so your pain can be treated.   Special Considerations -If you are diabetic, you may be placed on insulin after surgery to have closer control over your blood sugars to promote healing and recovery.  This does not mean that you will be discharged on insulin.  If applicable,  your oral antidiabetics will be resumed when you are tolerating a solid diet.   -Your final pathology results from surgery should be available around one week after surgery and the results will be relayed to you when available.   -Dr. Lahoma Crocker is the surgeon that assists your GYN Oncologist with surgery.  If you end up staying the night, the next day after your surgery you will either see Dr. Berline Lopes or Dr. Lahoma Crocker.   -FMLA forms can be faxed to [phone] and please allow 5-7 business days for completion.   Pain Management After  Surgery -You can use the tramadol you have at home for pain if needed.   -Make sure that you have Tylenol at home to use on a regular basis after surgery for pain control.    Bowel Regimen -You have been prescribed Sennakot-S to take nightly to prevent constipation especially if you are taking the narcotic pain medication intermittently.  It is important to prevent constipation and drink adequate amounts of liquids. You can stop taking this medication when you are not taking pain medication and you are back on your normal bowel routine.   Risks of Surgery Risks of surgery are low but include bleeding, infection, damage to surrounding structures, re-operation, blood clots, and very rarely death.     Blood Transfusion Information (For the consent to be signed before surgery)   We will be checking your blood type before surgery so in case of emergencies, we will know what type of blood you would need.                                             WHAT IS A BLOOD TRANSFUSION?   A transfusion is the replacement of blood or some of its parts. Blood is made up of multiple cells which provide different functions. Red blood cells carry oxygen and are used for blood loss replacement. White blood cells fight against infection. Platelets control bleeding. Plasma helps clot blood. Other blood products are available for specialized needs, such as hemophilia or other clotting disorders. BEFORE THE TRANSFUSION  Who gives blood for transfusions?  You may be able to donate blood to be used at a later date on yourself (autologous donation). Relatives can be asked to donate blood. This is generally not any safer than if you have received blood from a stranger. The same precautions are taken to ensure safety when a relative's blood is donated. Healthy volunteers who are fully evaluated to make sure their blood is safe. This is blood bank blood. Transfusion therapy is the safest it has ever been in the practice  of medicine. Before blood is taken from a donor, a complete history is taken to make sure that person has no history of diseases nor engages in risky social behavior (examples are intravenous drug use or sexual activity with multiple partners). The donor's travel history is screened to minimize risk of transmitting infections, such as malaria. The donated blood is tested for signs of infectious diseases, such as HIV and hepatitis. The blood is then tested to be sure it is compatible with you in order to minimize the chance of a transfusion reaction. If you or a relative donates blood, this is often done in anticipation of surgery and is not appropriate for emergency situations. It takes many days to process the donated blood. RISKS AND COMPLICATIONS  Although transfusion therapy is very safe and saves many lives, the main dangers of transfusion include:  Getting an infectious disease. Developing a transfusion reaction. This is an allergic reaction to something in the blood you were given. Every precaution is taken to prevent this. The decision to have a blood transfusion has been considered carefully by your caregiver before blood is given. Blood is not given unless the benefits outweigh the risks.   AFTER SURGERY INSTRUCTIONS   Return to work: 4-6 weeks if applicable   Activity: 1. Be up and out of the bed during the day.  Take a nap if needed.  You may walk up steps but be careful and use the hand rail.  Stair climbing will tire you more than you think, you may need to stop part way and rest.    2. No lifting or straining for 6 weeks over 10 pounds. No pushing, pulling, straining for 6 weeks.   3. No driving for 1 week(s).  Do not drive if you are taking narcotic pain medicine and make sure that your reaction time has returned.    4. You can shower as soon as the next day after surgery. Shower daily.  Use your regular soap and water (not directly on the incision) and pat your incision(s) dry  afterwards; don't rub.  No tub baths or submerging your body in water until cleared by your surgeon. If you have the soap that was given to you by pre-surgical testing that was used before surgery, you do not need to use it afterwards because this can irritate your incisions.    5. No sexual activity and nothing in the vagina for 8 weeks.   6. You may experience a small amount of clear drainage from your incisions, which is normal.  If the drainage persists, increases, or changes color please call the office.   7. Do not use creams, lotions, or ointments such as neosporin on your incisions after surgery until advised by your surgeon because they can cause removal of the dermabond glue on your incisions.     8. You may experience vaginal spotting after surgery or around the 6-8 week mark from surgery when the stitches at the top of the vagina begin to dissolve.  The spotting is normal but if you experience heavy bleeding, call our office.   9. Take Tylenol first for pain and only use narcotic pain medication for severe pain not relieved by the Tylenol.  Monitor your Tylenol intake to a max of 4,000 mg in a 24 hour period.    Diet: 1. Low sodium Heart Healthy Diet is recommended but you are cleared to resume your normal (before surgery) diet after your procedure.   2. It is safe to use a laxative, such as Miralax or Colace, if you have difficulty moving your bowels. You have been prescribed Sennakot-S to take at bedtime every evening after surgery to keep bowel movements regular and to prevent constipation.     Wound Care: 1. Keep clean and dry.  Shower daily.   Reasons to call the Doctor: Fever - Oral temperature greater than 100.4 degrees Fahrenheit Foul-smelling vaginal discharge Difficulty urinating Nausea and vomiting Increased pain at the site of the incision that is unrelieved with pain medicine. Difficulty breathing with or without chest pain New calf pain especially if only on one  side Sudden, continuing increased vaginal bleeding with or without clots.   Contacts: For questions or concerns you should contact:   Dr.  Jeral Pinch at [phone]   Joylene John, NP at [phone]   After Hours: call [phone] and have the GYN Oncologist paged/contacted (after 5 pm or on the weekends).   Messages sent via mychart are for non-urgent matters and are not responded to after hours so for urgent needs, please call the after hours number.

## 2021-10-04 NOTE — Patient Instructions (Addendum)
Plan to have a CT scan before surgery and Dr. Berline Lopes will notify you of the results.   Preparing for your Surgery  Plan for surgery on October 10, 2021 with Dr. Jeral Pinch at [place] will be scheduled for robotic assisted total laparoscopic hysterectomy (removal of the uterus and cervix), bilateral salpingo-oophorectomy (removal of both ovaries and fallopian tubes), sentinel lymph node biopsy, possible lymph node dissection, possible laparotomy (larger incision on your abdomen if needed).   Pre-operative Testing -You will receive a phone call from presurgical testing at [facility] to arrange for a pre-operative appointment and lab work.  -Bring your insurance card, copy of an advanced directive if applicable, medication list  -At that visit, you will be asked to sign a consent for a possible blood transfusion in case a transfusion becomes necessary during surgery.  The need for a blood transfusion is rare but having consent is a necessary part of your care.     -You are fine to keep taking your aspirin 81 mg before surgery with your last dose being the day before surgery.  -Do not take supplements such as fish oil (omega 3), red yeast rice, turmeric before your surgery. You want to avoid medications with aspirin in them including headache powders such as BC or Goody's), Excedrin migraine.  Day Before Surgery at [address] will be asked to take in a light diet the day before surgery. You will be advised you can have clear liquids up until 3 hours before your surgery.    Eat a light diet the day before surgery.  Examples including soups, broths, toast, yogurt, mashed potatoes.  AVOID GAS PRODUCING FOODS. Things to avoid include carbonated beverages (fizzy beverages, sodas), raw fruits and raw vegetables (uncooked), or beans.   If your bowels are filled with gas, your surgeon will have difficulty visualizing your pelvic organs which increases your surgical  risks.  Your role in recovery Your role is to become active as soon as directed by your doctor, while still giving yourself time to heal.  Rest when you feel tired. You will be asked to do the following in order to speed your recovery:  - Cough and breathe deeply. This helps to clear and expand your lungs and can prevent pneumonia after surgery.  - [place]. Do mild physical activity. Walking or moving your legs help your circulation and body functions return to normal. Do not try to get up or walk alone the first time after surgery.   -If you develop swelling on one leg or the other, pain in the back of your leg, redness/warmth in one of your legs, please call the office or go to the Emergency Room to have a doppler to rule out a blood clot. For shortness of breath, chest pain-seek care in the Emergency Room as soon as possible. - Actively manage your pain. Managing your pain lets you move in comfort. We will ask you to rate your pain on a scale of zero to 10. It is your responsibility to tell your doctor or nurse where and how much you hurt so your pain can be treated.  Special Considerations -If you are diabetic, you may be placed on insulin after surgery to have closer control over your blood sugars to promote healing and recovery.  This does not mean that you will be discharged on insulin.  If applicable, your oral antidiabetics will be resumed when you are tolerating a solid diet.  -  Your final pathology results from surgery should be available around one week after surgery and the results will be relayed to you when available.  -Dr. Lahoma Crocker is the surgeon that assists your GYN Oncologist with surgery.  If you end up staying the night, the next day after your surgery you will either see Dr. Berline Lopes or Dr. Lahoma Crocker.  -FMLA forms can be faxed to [phone] and please allow 5-7 business days for completion.  Pain Management After Surgery -You can use  the tramadol you have at home for pain if needed.  -Make sure that you have Tylenol at home to use on a regular basis after surgery for pain control.   Bowel Regimen -You have been prescribed Sennakot-S to take nightly to prevent constipation especially if you are taking the narcotic pain medication intermittently.  It is important to prevent constipation and drink adequate amounts of liquids. You can stop taking this medication when you are not taking pain medication and you are back on your normal bowel routine.  Risks of Surgery Risks of surgery are low but include bleeding, infection, damage to surrounding structures, re-operation, blood clots, and very rarely death.   Blood Transfusion Information (For the consent to be signed before surgery)  We will be checking your blood type before surgery so in case of emergencies, we will know what type of blood you would need.                                            WHAT IS A BLOOD TRANSFUSION?  A transfusion is the replacement of blood or some of its parts. Blood is made up of multiple cells which provide different functions. Red blood cells carry oxygen and are used for blood loss replacement. White blood cells fight against infection. Platelets control bleeding. Plasma helps clot blood. Other blood products are available for specialized needs, such as hemophilia or other clotting disorders. BEFORE THE TRANSFUSION  Who gives blood for transfusions?  You may be able to donate blood to be used at a later date on yourself (autologous donation). Relatives can be asked to donate blood. This is generally not any safer than if you have received blood from a stranger. The same precautions are taken to ensure safety when a relative's blood is donated. Healthy volunteers who are fully evaluated to make sure their blood is safe. This is blood bank blood. Transfusion therapy is the safest it has ever been in the practice of medicine. Before blood is  taken from a donor, a complete history is taken to make sure that person has no history of diseases nor engages in risky social behavior (examples are intravenous drug use or sexual activity with multiple partners). The donor's travel history is screened to minimize risk of transmitting infections, such as malaria. The donated blood is tested for signs of infectious diseases, such as HIV and hepatitis. The blood is then tested to be sure it is compatible with you in order to minimize the chance of a transfusion reaction. If you or a relative donates blood, this is often done in anticipation of surgery and is not appropriate for emergency situations. It takes many days to process the donated blood. RISKS AND COMPLICATIONS Although transfusion therapy is very safe and saves many lives, the main dangers of transfusion include:  Getting an infectious disease. Developing a transfusion reaction. This  is an allergic reaction to something in the blood you were given. Every precaution is taken to prevent this. The decision to have a blood transfusion has been considered carefully by your caregiver before blood is given. Blood is not given unless the benefits outweigh the risks.  AFTER SURGERY INSTRUCTIONS  Return to work: 4-6 weeks if applicable  Activity: 1. Be up and out of the bed during the day.  Take a nap if needed.  You may walk up steps but be careful and use the hand rail.  Stair climbing will tire you more than you think, you may need to stop part way and rest.   2. No lifting or straining for 6 weeks over 10 pounds. No pushing, pulling, straining for 6 weeks.  3. No driving for 1 week(s).  Do not drive if you are taking narcotic pain medicine and make sure that your reaction time has returned.   4. You can shower as soon as the next day after surgery. Shower daily.  Use your regular soap and water (not directly on the incision) and pat your incision(s) dry afterwards; don't rub.  No tub baths or  submerging your body in water until cleared by your surgeon. If you have the soap that was given to you by pre-surgical testing that was used before surgery, you do not need to use it afterwards because this can irritate your incisions.   5. No sexual activity and nothing in the vagina for 8 weeks.  6. You may experience a small amount of clear drainage from your incisions, which is normal.  If the drainage persists, increases, or changes color please call the office.  7. Do not use creams, lotions, or ointments such as neosporin on your incisions after surgery until advised by your surgeon because they can cause removal of the dermabond glue on your incisions.    8. You may experience vaginal spotting after surgery or around the 6-8 week mark from surgery when the stitches at the top of the vagina begin to dissolve.  The spotting is normal but if you experience heavy bleeding, call our office.  9. Take Tylenol first for pain and only use narcotic pain medication for severe pain not relieved by the Tylenol.  Monitor your Tylenol intake to a max of 4,000 mg in a 24 hour period.   Diet: 1. Low sodium Heart Healthy Diet is recommended but you are cleared to resume your normal (before surgery) diet after your procedure.  2. It is safe to use a laxative, such as Miralax or Colace, if you have difficulty moving your bowels. You have been prescribed Sennakot-S to take at bedtime every evening after surgery to keep bowel movements regular and to prevent constipation.    Wound Care: 1. Keep clean and dry.  Shower daily.  Reasons to call the Doctor: Fever - Oral temperature greater than 100.4 degrees Fahrenheit Foul-smelling vaginal discharge Difficulty urinating Nausea and vomiting Increased pain at the site of the incision that is unrelieved with pain medicine. Difficulty breathing with or without chest pain New calf pain especially if only on one side Sudden, continuing increased vaginal  bleeding with or without clots.   Contacts: For questions or concerns you should contact:  Dr. Jeral Pinch at [phone]  Joylene John, NP at [phone]  After Hours: call [phone] and have the GYN Oncologist paged/contacted (after 5 pm or on the weekends).  Messages sent via mychart are for non-urgent matters and are not responded to  after hours so for urgent needs, please call the after hours number.

## 2021-10-04 NOTE — Progress Notes (Signed)
GYNECOLOGIC ONCOLOGY NEW PATIENT CONSULTATION   Patient Name: Becky [facility]  Patient Age: 70 y.o. Date of Service: 10/04/21 Referring Provider: Dr. 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  We reviewed the nature of endometrial cancer and its recommended surgical staging, including total hysterectomy, bilateral salpingo-oophorectomy, and lymph node assessment. The patient is a suitable candidate for staging via a minimally invasive approach to surgery.  We reviewed that robotic assistance would be used to complete the surgery.   We discussed that most endometrial cancer is detected early and that decisions regarding adjuvant therapy will be made based on her final pathology.  Given her high grade disease, I recommend CT scan preoperatively to rule out metastatic disease.  We reviewed the sentinel lymph node technique. Risks and benefits of sentinel lymph node biopsy was reviewed. We reviewed the technique and ICG dye. The patient DOES NOT have an iodine allergy or known liver dysfunction. We reviewed the false negative rate (0.4%), and that 3% of patients with metastatic disease will not have it detected by SLN biopsy in endometrial cancer. A low risk of allergic reaction to the dye, <0.2% for ICG, has been reported. We also discussed that in the case of failed mapping, which occurs 40% of the time, a bilateral or unilateral lymphadenectomy will be performed at the surgeons discretion.   Potential benefits of sentinel nodes including a higher detection rate for metastasis due to ultrastaging and potential reduction in operative morbidity. However, there remains uncertainty as to the role for treatment of micrometastatic disease. Further, the benefit of operative morbidity associated with the SLN technique in  endometrial cancer is not yet completely known. In other patient populations (e.g. the cervical cancer population) there has been observed reductions in morbidity with SLN biopsy compared to pelvic lymphadenectomy. Lymphedema, nerve dysfunction and lymphocysts are all potential risks with the SLN technique as with complete lymphadenectomy. Additional risks to the patient include the risk of damage to an internal organ while operating in an altered view (e.g. the black and white image of the robotic fluorescence imaging mode).   We discussed plan for a robotic assisted hysterectomy, bilateral salpingo-oophorectomy, sentinel lymph node evaluation, possible lymph node dissection, possible laparotomy. The risks of surgery were discussed in detail and she understands these to include infection; wound separation; hernia; vaginal cuff separation, injury to adjacent organs such as bowel, bladder, blood vessels, ureters and nerves; bleeding which may require blood transfusion; anesthesia risk; thromboembolic events; possible death; unforeseen complications; possible need for re-exploration; medical complications such as heart attack, stroke, pleural effusion and pneumonia; and, if full lymphadenectomy is performed the risk of lymphedema and lymphocyst. The patient will receive DVT and antibiotic prophylaxis as indicated. She voiced a clear understanding. She had the opportunity to ask questions. Perioperative instructions were reviewed with her. Prescriptions for post-op medications were sent to her pharmacy of choice.  Discussed pathogenesis for endometrial cancer and role that excess and unopposed estrogen plays.  Patient has already achieved significant weight loss and was congratulated on this effort.  A copy of this note was sent to the patient's referring provider.   62 minutes of total time was spent for this patient encounter, including preparation, face-to-face counseling with the patient and coordination of  care, and documentation of the encounter.  Jeral Pinch, MD  Division of Gynecologic Oncology  Department of Obstetrics and  Gynecology  °[facility]  °___________________________________________  °Chief Complaint: °Chief Complaint  °Patient presents with  ° Endometrial cancer (HCC)  ° ° °History of Present Illness:  °Becky [facility] is a 70 y.o. y.o. female who is seen in consultation at the request of Dr. Taavon for an evaluation of endometrial cancer. ° °Patient denies any bleeding after menopause until May of this year.  She then began having light vaginal spotting which she described as light bleeding that would last up to a day and then no bleeding for several weeks at a time.  This pattern has repeated since then, without any increase in quantity or frequency of her bleeding.  She denies any associated pelvic pain or cramping. ° °Patient went to the operating room on 12/19 in the setting of postmenopausal bleeding with structural lesion seen on saline sonohysterogram.  She underwent diagnostic hysteroscopy, D&C, and MyoSure resection of anterior wall endometrial polyp (noted to be incomplete).  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 ° °Reports having a good appetite.  She has been somewhat intentional about losing weight over the last year and has lost 50 pounds.  She denies any nausea or emesis.  She reports normal bowel function.  She denies any urinary symptoms although feels like the amount that she is urinating is less in volume this week.  Thinks this may be related to decreased p.o. intake. ° °Had an episode in July of shortness of breath after taking a hot bath.  She describes this as having "panting" suddenly for about 45 minutes.  She called her son and went to the emergency department but her symptoms were basically gone by the time she got there.  She had a chest x-ray that showed some emphysematous changes.  Denies any  further episodes since then. ° °She works for [employer].  Lives in [place] with her granddaughter and her granddaughter's boyfriend. ° °PAST MEDICAL HISTORY:  °Past Medical History:  °Diagnosis Date  ° Arthritis   ° both knees  ° CKD (chronic kidney disease), stage III (HCC)   ° no nephrologist  ° Diabetes mellitus without complication (HCC)   ° DM2, injection once a week; cbgs normally 110-130  ° Essential hypertension   ° Gout   ° Mixed hyperlipidemia   ° Morbid obesity (HCC)   ° Pure hypercholesterolemia   ° Vitamin D deficiency   ° Wears glasses   °  ° °PAST SURGICAL HISTORY:  °Past Surgical History:  °Procedure Laterality Date  ° CHOLECYSTECTOMY  1989  ° DILATATION & CURETTAGE/HYSTEROSCOPY WITH MYOSURE N/A 09/30/2021  ° Procedure: DILATATION & CURETTAGE/HYSTEROSCOPY WITH MYOSURE;  Surgeon: Taavon, Richard, MD;  Location: [facility];  Service: Gynecology;  Laterality: N/A;  ° EYE SURGERY    ° cataract surgery, lens implant on right eye  ° LASIK Bilateral 2002  ° TONSILLECTOMY  1953  ° ° °OB/GYN HISTORY:  °OB History  °Gravida Para Term Preterm AB Living  °2 2          °SAB IAB Ectopic Multiple Live Births  °           °  °# Outcome Date GA Lbr Len/2nd Weight Sex Delivery Anes PTL Lv  °2 Para           °1 Para           ° ° °No LMP recorded. Patient is postmenopausal. ° °Age at menarche: 10 °Age at menopause: 50 °Hx of HRT:   Denies Hx of STDs: Denies Last pap: 2017 History of abnormal pap smears: No  SCREENING STUDIES:  Last mammogram: 07/2021  Last colonoscopy: 2017  MEDICATIONS: Outpatient Encounter Medications as of 10/04/2021  Medication Sig   acetaminophen (TYLENOL) 325 MG tablet Take 650 mg by mouth every 6 (six) hours as needed.   amLODipine (NORVASC) 10 MG tablet Take 10 mg by mouth at bedtime.   Cholecalciferol (VITAMIN D3) 1.25 MG (50000 UT) CAPS Take by mouth daily.   diclofenac Sodium (VOLTAREN) 1 % GEL Apply topically daily as needed. For knees    hydrochlorothiazide (MICROZIDE) 12.5 MG capsule Take 12.5 mg by mouth daily.   loratadine (CLARITIN) 10 MG tablet Take 10 mg by mouth daily as needed.   Menthol, Topical Analgesic, (BIOFREEZE ROLL-ON EX) Apply topically as needed.   rosuvastatin (CRESTOR) 5 MG tablet Take 5 mg by mouth at bedtime.   Semaglutide, 1 MG/DOSE, (OZEMPIC, 1 MG/DOSE,) 2 MG/1.5ML SOPN Inject into the skin once a week. Injection on Sundays.   telmisartan (MICARDIS) 40 MG tablet Take 40 mg by mouth daily.   UNABLE TO FIND as needed. Med Name: [facility] for leg cramps   aspirin 81 MG chewable tablet Chew by mouth daily. (Patient not taking: Reported on 10/03/2021)   BINAXNOW COVID-19 AG HOME TEST KIT TEST AS DIRECTED TODAY (Patient not taking: Reported on 10/03/2021)   traMADol (ULTRAM) 50 MG tablet Take 1 tablet (50 mg total) by mouth every 6 (six) hours as needed. (Patient not taking: Reported on 10/03/2021)   zinc gluconate 50 MG tablet Take 50 mg by mouth daily. Takes occasionally 25 mg. (Patient not taking: Reported on 10/03/2021)   No facility-administered encounter medications on file as of 10/04/2021.    ALLERGIES:  Allergies  Allergen Reactions   Lisinopril Cough     FAMILY HISTORY:  Family History  Problem Relation Age of Onset   Breast cancer Paternal Aunt    Uterine cancer Paternal Aunt    Lung cancer Maternal Uncle    Colon cancer Neg Hx    Ovarian cancer Neg Hx    Endometrial cancer Neg Hx    Pancreatic cancer Neg Hx    Prostate cancer Neg Hx      SOCIAL HISTORY:  Social Connections: Not on file    REVIEW OF SYSTEMS:  Denies appetite changes, fevers, chills, fatigue, unexplained weight changes. Denies hearing loss, neck lumps or masses, mouth sores, ringing in ears or voice changes. Denies cough or wheezing.  Denies shortness of breath. Denies chest pain or palpitations. Denies leg swelling. Denies abdominal distention, pain, blood in stools, constipation, diarrhea, nausea, vomiting,  or early satiety. Denies pain with intercourse, dysuria, frequency, hematuria or incontinence. Denies hot flashes, pelvic pain, vaginal bleeding or vaginal discharge.   Denies joint pain, back pain or muscle pain/cramps. Denies itching, rash, or wounds. Denies dizziness, headaches, numbness or seizures. Denies swollen lymph nodes or glands, denies easy bruising or bleeding. Denies anxiety, depression, confusion, or decreased concentration.  Physical Exam:  Vital Signs for this encounter:  Blood pressure (!) 107/53, pulse 72, temperature 97.7 F (36.5 C), temperature source Oral, resp. rate 16, height _0  (1.626 m), weight 213 lb 12.8 oz (97 kg), SpO2 98 %. Body mass index is 36.7 kg/m. General: Alert, oriented, no acute distress.  HEENT: Normocephalic, atraumatic. Sclera anicteric.  Chest: Clear to auscultation bilaterally. No wheezes, rhonchi, or rales. Cardiovascular: Regular rate and rhythm, no murmurs, rubs, or gallops.  Abdomen: Obese. Normoactive bowel sounds.  Soft, nondistended, nontender to palpation. No masses or hepatosplenomegaly appreciated. No palpable fluid wave.  Extremities: Grossly normal range of motion. Warm, well perfused. No edema bilaterally.  Skin: No rashes or lesions.  Lymphatics: No cervical, supraclavicular, or inguinal adenopathy.  GU:  Normal external female genitalia. No lesions. No discharge or bleeding.             Bladder/urethra:  No lesions or masses, well supported bladder             Vagina: Mildly atrophic, no lesions or masses.             Cervix: Normal appearing, no lesions.             Uterus: Small, mobile, no parametrial involvement or nodularity.             Adnexa: No masses appreciated.  Rectal: Deferred.  LABORATORY AND RADIOLOGIC DATA:  Outside medical records were reviewed to synthesize the above history, along with the history and physical obtained during the visit.   Lab Results  Component Value Date   HGB 14.3 09/30/2021    HCT 42.0 09/30/2021   GLUCOSE 124 (H) 09/30/2021   NA 141 09/30/2021   K 3.6 09/30/2021   CL 101 09/30/2021   CREATININE 1.20 (H) 09/30/2021   BUN 17 09/30/2021

## 2021-10-04 NOTE — Progress Notes (Signed)
Patient here with family for new patient consultation with Dr. Berline Lopes and for a pre-operative discussion prior to her scheduled surgery on October 10, 2021. She is scheduled for robotic assisted total laparoscopic hysterectomy, bilateral salpingo-oophorectomy, sentinel lymph node biopsy, possible lymph node dissection, possible laparotomy. The surgery was discussed in detail.  See after visit summary for additional details. Visual aids used to discuss items related to surgery including sequential compression stockings, foley catheter, IV pump, multi-modal pain regimen including tylenol, photo of the surgical robot, female reproductive system to discuss surgery in detail.      Discussed post-op pain management in detail including the aspects of the enhanced recovery pathway.  Advised her that a new prescription would be sent in for tramadol and it is only to be used for after her upcoming surgery.  We discussed the use of tylenol post-op and to monitor for a maximum of 4,000 mg in a 24 hour period.  Also prescribed sennakot to be used after surgery and to hold if having loose stools.  Discussed bowel regimen in detail.     Discussed the use of heparin pre-op, SCDs, and measures to take at home to prevent DVT including frequent mobility.  Reportable signs and symptoms of DVT discussed. Post-operative instructions discussed and expectations for after surgery. Incisional care discussed as well including reportable signs and symptoms including erythema, drainage, wound separation.  Information for CT scan discussed along with oral contrast given.     10 minutes spent with the patient.  Verbalizing understanding of material discussed. No needs or concerns voiced at the end of the visit.   Advised patient and family to call for any needs.  Advised that her post-operative medications had been prescribed and could be picked up at any time.    This appointment is included in the global surgical bundle as  pre-operative teaching and has no charge.

## 2021-10-04 NOTE — H&P (View-Only) (Signed)
GYNECOLOGIC ONCOLOGY NEW PATIENT CONSULTATION   Patient Name: Becky Dudley [facility]  Patient Age: 70 y.o. Date of Service: 10/04/21 Referring Provider: Dr. 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  We reviewed the nature of endometrial cancer and its recommended surgical staging, including total hysterectomy, bilateral salpingo-oophorectomy, and lymph node assessment. The patient is a suitable candidate for staging via a minimally invasive approach to surgery.  We reviewed that robotic assistance would be used to complete the surgery.   We discussed that most endometrial cancer is detected early and that decisions regarding adjuvant therapy will be made based on her final pathology.  Given her high grade disease, I recommend CT scan preoperatively to rule out metastatic disease.  We reviewed the sentinel lymph node technique. Risks and benefits of sentinel lymph node biopsy was reviewed. We reviewed the technique and ICG dye. The patient DOES NOT have an iodine allergy or known liver dysfunction. We reviewed the false negative rate (0.4%), and that 3% of patients with metastatic disease will not have it detected by SLN biopsy in endometrial cancer. A low risk of allergic reaction to the dye, <0.2% for ICG, has been reported. We also discussed that in the case of failed mapping, which occurs 40% of the time, a bilateral or unilateral lymphadenectomy will be performed at the surgeons discretion.   Potential benefits of sentinel nodes including a higher detection rate for metastasis due to ultrastaging and potential reduction in operative morbidity. However, there remains uncertainty as to the role for treatment of micrometastatic disease. Further, the benefit of operative morbidity associated with the SLN technique in  endometrial cancer is not yet completely known. In other patient populations (e.g. the cervical cancer population) there has been observed reductions in morbidity with SLN biopsy compared to pelvic lymphadenectomy. Lymphedema, nerve dysfunction and lymphocysts are all potential risks with the SLN technique as with complete lymphadenectomy. Additional risks to the patient include the risk of damage to an internal organ while operating in an altered view (e.g. the black and white image of the robotic fluorescence imaging mode).   We discussed plan for a robotic assisted hysterectomy, bilateral salpingo-oophorectomy, sentinel lymph node evaluation, possible lymph node dissection, possible laparotomy. The risks of surgery were discussed in detail and she understands these to include infection; wound separation; hernia; vaginal cuff separation, injury to adjacent organs such as bowel, bladder, blood vessels, ureters and nerves; bleeding which may require blood transfusion; anesthesia risk; thromboembolic events; possible death; unforeseen complications; possible need for re-exploration; medical complications such as heart attack, stroke, pleural effusion and pneumonia; and, if full lymphadenectomy is performed the risk of lymphedema and lymphocyst. The patient will receive DVT and antibiotic prophylaxis as indicated. She voiced a clear understanding. She had the opportunity to ask questions. Perioperative instructions were reviewed with her. Prescriptions for post-op medications were sent to her pharmacy of choice.  Discussed pathogenesis for endometrial cancer and role that excess and unopposed estrogen plays.  Patient has already achieved significant weight loss and was congratulated on this effort.  A copy of this note was sent to the patient's referring provider.   62 minutes of total time was spent for this patient encounter, including preparation, face-to-face counseling with the patient and coordination of  care, and documentation of the encounter.  Jeral Pinch, MD  Division of Gynecologic Oncology  Department of Obstetrics and  Gynecology  [facility]  ___________________________________________  Chief Complaint: Chief Complaint  Patient presents with   Endometrial cancer [facility])    History of Present Illness:  Becky Dudley is a 70 y.o. y.o. female who is seen in consultation at the request of Dr. Ronita Hipps for an evaluation of endometrial cancer.  Patient denies any bleeding after menopause until May of this year.  She then began having light vaginal spotting which she described as light bleeding that would last up to a day and then no bleeding for several weeks at a time.  This pattern has repeated since then, without any increase in quantity or frequency of her bleeding.  She denies any associated pelvic pain or cramping.  Patient went to the operating room on 12/19 in the setting of postmenopausal bleeding with structural lesion seen on saline sonohysterogram.  She underwent diagnostic hysteroscopy, D&C, and MyoSure resection of anterior wall endometrial polyp (noted to be incomplete).  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  Reports having a good appetite.  She has been somewhat intentional about losing weight over the last year and has lost 50 pounds.  She denies any nausea or emesis.  She reports normal bowel function.  She denies any urinary symptoms although feels like the amount that she is urinating is less in volume this week.  Thinks this may be related to decreased p.o. intake.  Had an episode in July of shortness of breath after taking a hot bath.  She describes this as having "panting" suddenly for about 45 minutes.  She called her son and went to the emergency department but her symptoms were basically gone by the time she got there.  She had a chest x-ray that showed some emphysematous changes.  Denies any  further episodes since then.  She works for [employer].  Lives in [place] with her granddaughter and her granddaughter's boyfriend.  PAST MEDICAL HISTORY:  Past Medical History:  Diagnosis Date   Arthritis    both knees   CKD (chronic kidney disease), stage III ([place])    no nephrologist   Diabetes mellitus without complication ([place])    DM2, injection once a week; cbgs normally 110-130   Essential hypertension    Gout    Mixed hyperlipidemia    Morbid obesity ([place])    Pure hypercholesterolemia    Vitamin D deficiency    Wears glasses      PAST SURGICAL HISTORY:  Past Surgical History:  Procedure Laterality Date   CHOLECYSTECTOMY  1989   DILATATION & CURETTAGE/HYSTEROSCOPY WITH MYOSURE N/A 09/30/2021   Procedure: DILATATION & CURETTAGE/HYSTEROSCOPY WITH MYOSURE;  Surgeon: Brien Few, MD;  Location: [place];  Service: Gynecology;  Laterality: N/A;   EYE SURGERY     cataract surgery, lens implant on right eye   LASIK Bilateral 2002   TONSILLECTOMY  1953    OB/GYN HISTORY:  OB History  Gravida Para Term Preterm AB Living  2 2          SAB IAB Ectopic Multiple Live Births               # Outcome Date GA Lbr Len/2nd Weight Sex Delivery Anes PTL Lv  2 Para           1 Para             No LMP recorded. Patient is postmenopausal.  Age at menarche: 6 Age at menopause: 51 Hx of HRT:  Denies Hx of STDs: Denies Last pap: 2017 History of abnormal pap smears: No  SCREENING STUDIES:  Last mammogram: 07/2021  Last colonoscopy: 2017  MEDICATIONS: Outpatient Encounter Medications as of 10/04/2021  Medication Sig   acetaminophen (TYLENOL) 325 MG tablet Take 650 mg by mouth every 6 (six) hours as needed.   amLODipine (NORVASC) 10 MG tablet Take 10 mg by mouth at bedtime.   Cholecalciferol (VITAMIN D3) 1.25 MG (50000 UT) CAPS Take by mouth daily.   diclofenac Sodium (VOLTAREN) 1 % GEL Apply topically daily as needed. For knees    hydrochlorothiazide (MICROZIDE) 12.5 MG capsule Take 12.5 mg by mouth daily.   loratadine (CLARITIN) 10 MG tablet Take 10 mg by mouth daily as needed.   Menthol, Topical Analgesic, (BIOFREEZE ROLL-ON EX) Apply topically as needed.   rosuvastatin (CRESTOR) 5 MG tablet Take 5 mg by mouth at bedtime.   Semaglutide, 1 MG/DOSE, (OZEMPIC, 1 MG/DOSE,) 2 MG/1.5ML SOPN Inject into the skin once a week. Injection on Sundays.   telmisartan (MICARDIS) 40 MG tablet Take 40 mg by mouth daily.   UNABLE TO FIND as needed. Med Name: [facility] O[facility] for leg cramps   aspirin 81 MG chewable tablet Chew by mouth daily. (Patient not taking: Reported on 10/03/2021)   BINAXNOW COVID-19 AG HOME TEST KIT TEST AS DIRECTED TODAY (Patient not taking: Reported on 10/03/2021)   traMADol (ULTRAM) 50 MG tablet Take 1 tablet (50 mg total) by mouth every 6 (six) hours as needed. (Patient not taking: Reported on 10/03/2021)   zinc gluconate 50 MG tablet Take 50 mg by mouth daily. Takes occasionally 25 mg. (Patient not taking: Reported on 10/03/2021)   No facility-administered encounter medications on file as of 10/04/2021.    ALLERGIES:  Allergies  Allergen Reactions   Lisinopril Cough     FAMILY HISTORY:  Family History  Problem Relation Age of Onset   Breast cancer Paternal Aunt    Uterine cancer Paternal Aunt    Lung cancer Maternal Uncle    Colon cancer Neg Hx    Ovarian cancer Neg Hx    Endometrial cancer Neg Hx    Pancreatic cancer Neg Hx    Prostate cancer Neg Hx      SOCIAL HISTORY:  Social Connections: Not on file    REVIEW OF SYSTEMS:  Denies appetite changes, fevers, chills, fatigue, unexplained weight changes. Denies hearing loss, neck lumps or masses, mouth sores, ringing in ears or voice changes. Denies cough or wheezing.  Denies shortness of breath. Denies chest pain or palpitations. Denies leg swelling. Denies abdominal distention, pain, blood in stools, constipation, diarrhea, nausea, vomiting,  or early satiety. Denies pain with intercourse, dysuria, frequency, hematuria or incontinence. Denies hot flashes, pelvic pain, vaginal bleeding or vaginal discharge.   Denies joint pain, back pain or muscle pain/cramps. Denies itching, rash, or wounds. Denies dizziness, headaches, numbness or seizures. Denies swollen lymph nodes or glands, denies easy bruising or bleeding. Denies anxiety, depression, confusion, or decreased concentration.  Physical Exam:  Vital Signs for this encounter:  Blood pressure (!) 107/53, pulse 72, temperature 97.7 F (36.5 C), temperature source Oral, resp. rate 16, height _0  (1.626 m), weight 213 lb 12.8 oz (97 kg), SpO2 98 %. Body mass index is 36.7 kg/m. General: Alert, oriented, no acute distress.  HEENT: Normocephalic, atraumatic. Sclera anicteric.  Chest: Clear to auscultation bilaterally. No wheezes, rhonchi, or rales. Cardiovascular: Regular rate and rhythm, no murmurs, rubs, or gallops.  Abdomen: Obese. Normoactive bowel sounds.  Soft, nondistended, nontender to palpation. No masses or hepatosplenomegaly appreciated. No palpable fluid wave.  Extremities: Grossly normal range of motion. Warm, well perfused. No edema bilaterally.  Skin: No rashes or lesions.  Lymphatics: No cervical, supraclavicular, or inguinal adenopathy.  GU:  Normal external female genitalia. No lesions. No discharge or bleeding.             Bladder/urethra:  No lesions or masses, well supported bladder             Vagina: Mildly atrophic, no lesions or masses.             Cervix: Normal appearing, no lesions.             Uterus: Small, mobile, no parametrial involvement or nodularity.             Adnexa: No masses appreciated.  Rectal: Deferred.  LABORATORY AND RADIOLOGIC DATA:  Outside medical records were reviewed to synthesize the above history, along with the history and physical obtained during the visit.   Lab Results  Component Value Date   HGB 14.3 09/30/2021    HCT 42.0 09/30/2021   GLUCOSE 124 (H) 09/30/2021   NA 141 09/30/2021   K 3.6 09/30/2021   CL 101 09/30/2021   CREATININE 1.20 (H) 09/30/2021   BUN 17 09/30/2021

## 2021-10-08 NOTE — Patient Instructions (Signed)
DUE TO COVID-19 ONLY ONE VISITOR IS ALLOWED TO COME WITH YOU AND STAY IN THE WAITING ROOM ONLY DURING PRE OP AND PROCEDURE DAY OF SURGERY IF YOU ARE GOING HOME AFTER SURGERY. IF YOU ARE SPENDING THE NIGHT 2 PEOPLE MAY VISIT WITH YOU IN YOUR PRIVATE ROOM AFTER SURGERY UNTIL VISITING  HOURS ARE OVER AT 800 PM AND 1  VISITOR  MAY  SPEND THE NIGHT.                 Leslie     Your procedure is scheduled on: 10/10/21   Report to [facility] Main  Entrance   Report to admitting at  11:20 AM     Call this number if you have problems the morning of surgery [phone]   Follow instructions from the Dr's office about diet and bowel prep if there is one  Have a light diet the day before surgery    Full Liquid Diet   Strained creamy soups Tea, Coffee- with cream or mild and sugar or honey  Juices- cranberry , grape and apple  Jello  Milkshakes  Pudding , custards  Popsicles  Water Plain ice cream f, frozen yogurt, sherbet, plain yogurt  Fruit ices and popsicles with no fruit pulp  Sugar, honey and syrups Clear broths  Boost, Ensure, Resource and other liquid supplements NO CARBONATED BEVERAGES     Remember: Do not eat food  :After Midnight the night before your surgery,     You may have clear liquids from midnight until 10:30 am    CLEAR LIQUID DIET   Foods Allowed                                                                     Foods Excluded  Coffee and tea, regular and decaf                             liquids that you cannot  Plain Jell-O any favor except red or purple                                           see through such as: Fruit ices (not with fruit pulp)                                     milk, soups, orange juice  Iced Popsicles                                    All solid food Carbonated beverages, regular and diet                                    Cranberry, grape and apple juices Sports drinks like Gatorade Lightly seasoned  clear broth or consume(fat free) Sugar      BRUSH YOUR TEETH MORNING OF SURGERY AND RINSE  YOUR MOUTH OUT, NO CHEWING GUM CANDY OR MINTS.     Take these medicines the morning of surgery with A SIP OF WATER: none  DO NOT TAKE ANY DIABETIC MEDICATIONS DAY OF YOUR SURGERY                               You may not have any metal on your body including hair pins and              piercings  Do not wear jewelry, make-up, lotions, powders or perfumes, deodorant             Do not wear nail polish on your fingernails.  Do not shave  48 hours prior to surgery.                Do not bring valuables to the hospital. [place].  Contacts, dentures or bridgework may not be worn into surgery.     Patients discharged the day of surgery will not be allowed to drive home.  IF YOU ARE HAVING SURGERY AND GOING HOME THE SAME DAY, YOU MUST HAVE AN ADULT TO DRIVE YOU HOME AND BE WITH YOU FOR 24 HOURS. YOU MAY GO HOME BY TAXI OR UBER OR ORTHERWISE, BUT AN ADULT MUST ACCOMPANY YOU HOME AND STAY WITH YOU FOR 24 HOURS.  Name and phone number of your driver:  Special Instructions: N/A              Please read over the following fact sheets you were given: _____________________________________________________________________             [facility] - Preparing for Surgery Before surgery, you can play an important role.  Because skin is not sterile, your skin needs to be as free of germs as possible.  You can reduce the number of germs on your skin by washing with CHG (chlorahexidine gluconate) soap before surgery.  CHG is an antiseptic cleaner which kills germs and bonds with the skin to continue killing germs even after washing. Please DO NOT use if you have an allergy to CHG or antibacterial soaps.  If your skin becomes reddened/irritated stop using the CHG and inform your nurse when you arrive at Short Stay. Do not shave (including legs and underarms) for at  least 48 hours prior to the first CHG shower.   Please follow these instructions carefully:  1.  Shower with CHG Soap the night before surgery and the  morning of Surgery.  2.  If you choose to wash your hair, wash your hair first as usual with your  normal  shampoo.  3.  After you shampoo, rinse your hair and body thoroughly to remove the  shampoo.                            4.  Use CHG as you would any other liquid soap.  You can apply chg directly  to the skin and wash                       Gently with a scrungie or clean washcloth.  5.  Apply the CHG Soap to your body ONLY FROM THE NECK DOWN.   Do not use on face/ open  Wound or open sores. Avoid contact with eyes, ears mouth and genitals (private parts).                       Wash face,  Genitals (private parts) with your normal soap.             6.  Wash thoroughly, paying special attention to the area where your surgery  will be performed.  7.  Thoroughly rinse your body with warm water from the neck down.  8.  DO NOT shower/wash with your normal soap after using and rinsing off  the CHG Soap.                9.  Pat yourself dry with a clean towel.            10.  Wear clean pajamas.            11.  Place clean sheets on your bed the night of your first shower and do not  sleep with pets. Day of Surgery : Do not apply any lotions/deodorants the morning of surgery.  Please wear clean clothes to the hospital/surgery center.  FAILURE TO FOLLOW THESE INSTRUCTIONS MAY RESULT IN THE CANCELLATION OF YOUR SURGERY PATIENT SIGNATURE_________________________________  NURSE SIGNATURE__________________________________  ________________________________________________________________________

## 2021-10-09 ENCOUNTER — Encounter (HOSPITAL_COMMUNITY): Payer: Self-pay

## 2021-10-09 ENCOUNTER — Encounter (HOSPITAL_COMMUNITY)
Admission: RE | Admit: 2021-10-09 | Discharge: 2021-10-09 | Disposition: A | Discharge status: Home / Self Care | Payer: 59 | Source: Ambulatory Visit | Attending: Gynecologic Oncology | Admitting: Gynecologic Oncology

## 2021-10-09 ENCOUNTER — Telehealth: Payer: Self-pay

## 2021-10-09 ENCOUNTER — Encounter: Payer: Self-pay | Admitting: Gynecologic Oncology

## 2021-10-09 ENCOUNTER — Ambulatory Visit (HOSPITAL_COMMUNITY)
Admission: RE | Admit: 2021-10-09 | Discharge: 2021-10-09 | Disposition: A | Discharge status: Home / Self Care | Payer: 59 | Source: Ambulatory Visit | Attending: Gynecologic Oncology | Admitting: Gynecologic Oncology

## 2021-10-09 ENCOUNTER — Other Ambulatory Visit: Payer: Self-pay | Admitting: Gynecologic Oncology

## 2021-10-09 ENCOUNTER — Other Ambulatory Visit: Payer: Self-pay

## 2021-10-09 VITALS — BP 112/67 | HR 63 | Temp 98.2°F | Resp 16 | Ht 64.0 in | Wt 216.1 lb

## 2021-10-09 DIAGNOSIS — R300 Dysuria: Secondary | ICD-10-CM | POA: Insufficient documentation

## 2021-10-09 DIAGNOSIS — E119 Type 2 diabetes mellitus without complications: Secondary | ICD-10-CM | POA: Insufficient documentation

## 2021-10-09 DIAGNOSIS — C541 Malignant neoplasm of endometrium: Secondary | ICD-10-CM

## 2021-10-09 DIAGNOSIS — E876 Hypokalemia: Secondary | ICD-10-CM

## 2021-10-09 LAB — URINALYSIS, ROUTINE W REFLEX MICROSCOPIC
Bacteria, UA: NONE SEEN
Bilirubin Urine: NEGATIVE
Glucose, UA: NEGATIVE mg/dL
Ketones, ur: NEGATIVE mg/dL
Leukocytes,Ua: NEGATIVE
Nitrite: NEGATIVE
Protein, ur: NEGATIVE mg/dL
Specific Gravity, Urine: 1.025 (ref 1.005–1.030)
pH: 5 (ref 5.0–8.0)

## 2021-10-09 LAB — COMPREHENSIVE METABOLIC PANEL
ALT: 18 U/L (ref 0–44)
AST: 24 U/L (ref 15–41)
Albumin: 4.1 g/dL (ref 3.5–5.0)
Alkaline Phosphatase: 58 U/L (ref 38–126)
Anion gap: 6 (ref 5–15)
BUN: 14 mg/dL (ref 8–23)
CO2: 28 mmol/L (ref 22–32)
Calcium: 9.1 mg/dL (ref 8.9–10.3)
Chloride: 104 mmol/L (ref 98–111)
Creatinine, Ser: 0.97 mg/dL (ref 0.44–1.00)
GFR, Estimated: >60 mL/min (ref >60)
Glucose, Bld: 112 mg/dL — ABNORMAL HIGH (ref 70–99)
Potassium: 2.9 mmol/L — ABNORMAL LOW (ref 3.5–5.1)
Sodium: 138 mmol/L (ref 135–145)
Total Bilirubin: 1 mg/dL (ref 0.3–1.2)
Total Protein: 6.8 g/dL (ref 6.5–8.1)

## 2021-10-09 LAB — CBC
HCT: 40 % (ref 36.0–46.0)
Hemoglobin: 13.3 g/dL (ref 12.0–15.0)
MCH: 30.4 pg (ref 26.0–34.0)
MCHC: 33.3 g/dL (ref 30.0–36.0)
MCV: 91.3 fL (ref 80.0–100.0)
Platelets: 275 10*3/uL (ref 150–400)
RBC: 4.38 MIL/uL (ref 3.87–5.11)
RDW: 13.1 % (ref 11.5–15.5)
WBC: 7.6 10*3/uL (ref 4.0–10.5)
nRBC: 0 % (ref 0.0–0.2)

## 2021-10-09 LAB — TYPE AND SCREEN
ABO/RH(D): O POS
Antibody Screen: NEGATIVE

## 2021-10-09 LAB — HEMOGLOBIN A1C
Hgb A1c MFr Bld: 5.9 % — ABNORMAL HIGH (ref 4.8–5.6)
Mean Plasma Glucose: 122.63 mg/dL

## 2021-10-09 MED ORDER — SODIUM CHLORIDE (PF) 0.9 % IJ SOLN
INTRAMUSCULAR | Status: AC
  Filled 2021-10-09: qty 50

## 2021-10-09 MED ORDER — POTASSIUM CHLORIDE CRYS ER 20 MEQ PO TBCR: 20.0000 meq | EXTENDED_RELEASE_TABLET | Freq: Two times a day (BID) | ORAL | 0 refills | Status: DC

## 2021-10-09 MED ORDER — IOHEXOL 350 MG/ML SOLN
80.0000 mL | Freq: Once | INTRAVENOUS | Status: AC | PRN
  Administered 2021-10-09: 08:00:00 80 mL via INTRAVENOUS

## 2021-10-09 NOTE — Progress Notes (Signed)
COVID test- NA  PCP - Dr. Quinn Axe Cardiologist - none  Chest x-ray - no EKG - 04/19/21-epic Stress Test - no ECHO - no Cardiac Cath - NA Pacemaker/ICD device last checked:NA  Sleep Study - no CPAP -   Fasting Blood Sugar - 110-128 Checks Blood Sugar _QD____ times a day  Blood Thinner Instructions:ASA 81 mg/ Dr. Lindell Noe Aspirin Instructions:continue/ Dr. Berline Lopes Last Dose:  Anesthesia review: no  Patient denies shortness of breath, fever, cough and chest pain at PAT appointment Pt has no SOB with any activities  Patient verbalized understanding of instructions that were given to them at the PAT appointment. Patient was also instructed that they will need to review over the PAT instructions again at home before surgery. yes

## 2021-10-09 NOTE — Progress Notes (Signed)
Given hypokalemia on pre-op labs, plan to prescribe potassium 20 meq twice today per Dr. Berline Lopes with plans for repeat Bmet the morning of surgery. Pt has hx of CKD as well.

## 2021-10-09 NOTE — Progress Notes (Signed)
Called the patient and reviewed CT scan results, no evidence of metastatic disease.

## 2021-10-09 NOTE — H&P (View-Only) (Signed)
Called the patient and reviewed CT scan results, no evidence of metastatic disease.

## 2021-10-09 NOTE — Telephone Encounter (Signed)
Spoke with [name] this morning. Reviewed CMP Results. Per Joylene John, NP potassium is slightly low (2.9). A prescription for potassium chloride has been sent to patients pharmacy. Instructed patient to pick up prescription and take 20 meq now and 20 meq tonight. Advised to eat bananas but not too many. We will repeat labs prior to surgery tomorrow. Patient verbalized understanding.   Reviewed pre-op instructions with patient. No questions or concerns voiced. Nothing to eat after midnight. Clear liquids until 10:30 am. She is to arrive to hospital at 11:20 am. Patient states she will be dropping off disability paperwork to be completed prior to her surgery tomorrow.  Instructed to call with any needs.

## 2021-10-10 ENCOUNTER — Other Ambulatory Visit: Payer: Self-pay | Admitting: Gynecologic Oncology

## 2021-10-10 ENCOUNTER — Encounter (HOSPITAL_COMMUNITY): Payer: Self-pay | Admitting: Gynecologic Oncology

## 2021-10-10 ENCOUNTER — Ambulatory Visit (HOSPITAL_COMMUNITY): Payer: 59 | Admitting: Physician Assistant

## 2021-10-10 ENCOUNTER — Ambulatory Visit (HOSPITAL_COMMUNITY): Payer: 59 | Admitting: Anesthesiology

## 2021-10-10 ENCOUNTER — Ambulatory Visit (HOSPITAL_COMMUNITY)
Admission: RE | Admit: 2021-10-10 | Discharge: 2021-10-10 | Disposition: A | Discharge status: Home / Self Care | Payer: 59 | Attending: Gynecologic Oncology | Admitting: Gynecologic Oncology

## 2021-10-10 ENCOUNTER — Encounter (HOSPITAL_COMMUNITY)
Admission: RE | Disposition: A | Discharge status: Home / Self Care | Payer: Self-pay | Source: Home / Self Care | Attending: Gynecologic Oncology

## 2021-10-10 DIAGNOSIS — S3141XA Laceration without foreign body of vagina and vulva, initial encounter: Secondary | ICD-10-CM | POA: Diagnosis not present

## 2021-10-10 DIAGNOSIS — E876 Hypokalemia: Secondary | ICD-10-CM

## 2021-10-10 DIAGNOSIS — Z79899 Other long term (current) drug therapy: Secondary | ICD-10-CM | POA: Diagnosis not present

## 2021-10-10 DIAGNOSIS — N8003 Adenomyosis of the uterus: Secondary | ICD-10-CM | POA: Diagnosis not present

## 2021-10-10 DIAGNOSIS — I129 Hypertensive chronic kidney disease with stage 1 through stage 4 chronic kidney disease, or unspecified chronic kidney disease: Secondary | ICD-10-CM | POA: Diagnosis not present

## 2021-10-10 DIAGNOSIS — D259 Leiomyoma of uterus, unspecified: Secondary | ICD-10-CM | POA: Diagnosis not present

## 2021-10-10 DIAGNOSIS — C541 Malignant neoplasm of endometrium: Secondary | ICD-10-CM

## 2021-10-10 DIAGNOSIS — E1122 Type 2 diabetes mellitus with diabetic chronic kidney disease: Secondary | ICD-10-CM | POA: Diagnosis not present

## 2021-10-10 DIAGNOSIS — X58XXXA Exposure to other specified factors, initial encounter: Secondary | ICD-10-CM | POA: Diagnosis not present

## 2021-10-10 DIAGNOSIS — N183 Chronic kidney disease, stage 3 unspecified: Secondary | ICD-10-CM | POA: Diagnosis not present

## 2021-10-10 DIAGNOSIS — R300 Dysuria: Secondary | ICD-10-CM

## 2021-10-10 DIAGNOSIS — N840 Polyp of corpus uteri: Secondary | ICD-10-CM | POA: Diagnosis not present

## 2021-10-10 HISTORY — PX: ROBOTIC ASSISTED TOTAL HYSTERECTOMY WITH BILATERAL SALPINGO OOPHERECTOMY: SHX6086

## 2021-10-10 HISTORY — PX: SENTINEL NODE BIOPSY: SHX6608

## 2021-10-10 HISTORY — PX: LYMPH NODE DISSECTION: SHX5087

## 2021-10-10 LAB — BASIC METABOLIC PANEL
Anion gap: 7 (ref 5–15)
BUN: 11 mg/dL (ref 8–23)
CO2: 28 mmol/L (ref 22–32)
Calcium: 9.2 mg/dL (ref 8.9–10.3)
Chloride: 103 mmol/L (ref 98–111)
Creatinine, Ser: 1.12 mg/dL — ABNORMAL HIGH (ref 0.44–1.00)
GFR, Estimated: 52 mL/min — ABNORMAL LOW (ref >60)
Glucose, Bld: 112 mg/dL — ABNORMAL HIGH (ref 70–99)
Potassium: 3.2 mmol/L — ABNORMAL LOW (ref 3.5–5.1)
Sodium: 138 mmol/L (ref 135–145)

## 2021-10-10 LAB — GLUCOSE, CAPILLARY
Glucose-Capillary: 118 mg/dL — ABNORMAL HIGH (ref 70–99)
Glucose-Capillary: 138 mg/dL — ABNORMAL HIGH (ref 70–99)

## 2021-10-10 SURGERY — HYSTERECTOMY, TOTAL, ROBOT-ASSISTED, LAPAROSCOPIC, WITH BILATERAL SALPINGO-OOPHORECTOMY
Anesthesia: General

## 2021-10-10 MED ORDER — LIDOCAINE HCL (PF) 2 % IJ SOLN
INTRAMUSCULAR | Status: AC
  Filled 2021-10-10: qty 15

## 2021-10-10 MED ORDER — PHENYLEPHRINE 40 MCG/ML (10ML) SYRINGE FOR IV PUSH (FOR BLOOD PRESSURE SUPPORT)
PREFILLED_SYRINGE | INTRAVENOUS | Status: DC | PRN
  Administered 2021-10-10: 80 ug via INTRAVENOUS

## 2021-10-10 MED ORDER — INDOCYANINE GREEN 25 MG IV SOLR
INTRAVENOUS | Status: DC | PRN
  Administered 2021-10-10: 2.5 mg

## 2021-10-10 MED ORDER — PROPOFOL 10 MG/ML IV BOLUS
INTRAVENOUS | Status: AC
  Filled 2021-10-10: qty 20

## 2021-10-10 MED ORDER — OXYCODONE HCL 5 MG PO TABS: 5.0000 mg | ORAL_TABLET | Freq: Once | ORAL | Status: DC | PRN

## 2021-10-10 MED ORDER — STERILE WATER FOR IRRIGATION IR SOLN
Status: DC | PRN
  Administered 2021-10-10: 1000 mL

## 2021-10-10 MED ORDER — HEPARIN SODIUM (PORCINE) 5000 UNIT/ML IJ SOLN
5000.0000 [IU] | INTRAMUSCULAR | Status: AC
  Administered 2021-10-10: 12:00:00 5000 [IU] via SUBCUTANEOUS
  Filled 2021-10-10: qty 1

## 2021-10-10 MED ORDER — CHLORHEXIDINE GLUCONATE 0.12 % MT SOLN
15.0000 mL | Freq: Once | OROMUCOSAL | Status: AC
  Administered 2021-10-10: 12:00:00 15 mL via OROMUCOSAL

## 2021-10-10 MED ORDER — ACETAMINOPHEN 500 MG PO TABS
1000.0000 mg | ORAL_TABLET | ORAL | Status: AC
  Administered 2021-10-10: 12:00:00 1000 mg via ORAL
  Filled 2021-10-10: qty 2

## 2021-10-10 MED ORDER — POTASSIUM CHLORIDE CRYS ER 10 MEQ PO TBCR: 10.0000 meq | EXTENDED_RELEASE_TABLET | Freq: Two times a day (BID) | ORAL | 0 refills | Status: DC

## 2021-10-10 MED ORDER — LIDOCAINE 2% (20 MG/ML) 5 ML SYRINGE
INTRAMUSCULAR | Status: DC | PRN
  Administered 2021-10-10: 80 mg via INTRAVENOUS

## 2021-10-10 MED ORDER — DEXAMETHASONE SODIUM PHOSPHATE 10 MG/ML IJ SOLN
INTRAMUSCULAR | Status: DC | PRN
  Administered 2021-10-10: 4 mg via INTRAVENOUS

## 2021-10-10 MED ORDER — FENTANYL CITRATE PF 50 MCG/ML IJ SOSY: 25.0000 ug | PREFILLED_SYRINGE | INTRAMUSCULAR | Status: DC | PRN

## 2021-10-10 MED ORDER — ACETAMINOPHEN 500 MG PO TABS: 1000.0000 mg | ORAL_TABLET | Freq: Once | ORAL | Status: DC | PRN

## 2021-10-10 MED ORDER — BUPIVACAINE HCL 0.25 % IJ SOLN
INTRAMUSCULAR | Status: DC | PRN
  Administered 2021-10-10: 35 mL

## 2021-10-10 MED ORDER — SODIUM CHLORIDE 0.9 % IR SOLN
Status: DC | PRN
  Administered 2021-10-10: 1000 mL via INTRAVESICAL

## 2021-10-10 MED ORDER — ACETAMINOPHEN 160 MG/5ML PO SOLN: 1000.0000 mg | Freq: Once | ORAL | Status: DC | PRN

## 2021-10-10 MED ORDER — FENTANYL CITRATE (PF) 100 MCG/2ML IJ SOLN
INTRAMUSCULAR | Status: DC | PRN
  Administered 2021-10-10: 25 ug via INTRAVENOUS
  Administered 2021-10-10: 50 ug via INTRAVENOUS
  Administered 2021-10-10: 25 ug via INTRAVENOUS

## 2021-10-10 MED ORDER — EPHEDRINE 5 MG/ML INJ
INTRAVENOUS | Status: AC
  Filled 2021-10-10: qty 5

## 2021-10-10 MED ORDER — SUGAMMADEX SODIUM 200 MG/2ML IV SOLN
INTRAVENOUS | Status: DC | PRN
  Administered 2021-10-10: 200 mg via INTRAVENOUS

## 2021-10-10 MED ORDER — ROCURONIUM BROMIDE 10 MG/ML (PF) SYRINGE
PREFILLED_SYRINGE | INTRAVENOUS | Status: AC
  Filled 2021-10-10: qty 10

## 2021-10-10 MED ORDER — ROCURONIUM BROMIDE 10 MG/ML (PF) SYRINGE
PREFILLED_SYRINGE | INTRAVENOUS | Status: DC | PRN
  Administered 2021-10-10: 100 mg via INTRAVENOUS

## 2021-10-10 MED ORDER — FENTANYL CITRATE (PF) 100 MCG/2ML IJ SOLN
INTRAMUSCULAR | Status: AC
  Filled 2021-10-10: qty 2

## 2021-10-10 MED ORDER — ACETAMINOPHEN 10 MG/ML IV SOLN: 1000.0000 mg | Freq: Once | INTRAVENOUS | Status: DC | PRN

## 2021-10-10 MED ORDER — DEXAMETHASONE SODIUM PHOSPHATE 4 MG/ML IJ SOLN: 4.0000 mg | INTRAMUSCULAR | Status: DC

## 2021-10-10 MED ORDER — PHENYLEPHRINE 40 MCG/ML (10ML) SYRINGE FOR IV PUSH (FOR BLOOD PRESSURE SUPPORT)
PREFILLED_SYRINGE | INTRAVENOUS | Status: AC
  Filled 2021-10-10: qty 10

## 2021-10-10 MED ORDER — ONDANSETRON HCL 4 MG/2ML IJ SOLN
INTRAMUSCULAR | Status: AC
  Filled 2021-10-10: qty 2

## 2021-10-10 MED ORDER — LACTATED RINGERS IR SOLN
Status: DC | PRN
  Administered 2021-10-10: 1

## 2021-10-10 MED ORDER — BUPIVACAINE HCL 0.25 % IJ SOLN
INTRAMUSCULAR | Status: AC
  Filled 2021-10-10: qty 1

## 2021-10-10 MED ORDER — OXYCODONE HCL 5 MG/5ML PO SOLN: 5.0000 mg | Freq: Once | ORAL | Status: DC | PRN

## 2021-10-10 MED ORDER — ORAL CARE MOUTH RINSE: 15.0000 mL | Freq: Once | OROMUCOSAL | Status: AC

## 2021-10-10 MED ORDER — CEFAZOLIN SODIUM-DEXTROSE 2-4 GM/100ML-% IV SOLN
2.0000 g | INTRAVENOUS | Status: AC
  Administered 2021-10-10: 14:00:00 2 g via INTRAVENOUS
  Filled 2021-10-10: qty 100

## 2021-10-10 MED ORDER — EPHEDRINE SULFATE-NACL 50-0.9 MG/10ML-% IV SOSY
PREFILLED_SYRINGE | INTRAVENOUS | Status: DC | PRN
  Administered 2021-10-10 (×3): 5 mg via INTRAVENOUS

## 2021-10-10 MED ORDER — DEXAMETHASONE SODIUM PHOSPHATE 10 MG/ML IJ SOLN
INTRAMUSCULAR | Status: AC
  Filled 2021-10-10: qty 1

## 2021-10-10 MED ORDER — ONDANSETRON HCL 4 MG/2ML IJ SOLN
INTRAMUSCULAR | Status: DC | PRN
  Administered 2021-10-10: 4 mg via INTRAVENOUS

## 2021-10-10 MED ORDER — LACTATED RINGERS IV SOLN: INTRAVENOUS | Status: DC

## 2021-10-10 MED ORDER — PROPOFOL 10 MG/ML IV BOLUS
INTRAVENOUS | Status: DC | PRN
  Administered 2021-10-10: 180 mg via INTRAVENOUS

## 2021-10-10 MED ORDER — LIDOCAINE HCL (PF) 2 % IJ SOLN
INTRAMUSCULAR | Status: DC | PRN
  Administered 2021-10-10: 1.5 mg/kg/h via INTRADERMAL

## 2021-10-10 MED ORDER — PHENYLEPHRINE HCL-NACL 20-0.9 MG/250ML-% IV SOLN
INTRAVENOUS | Status: DC | PRN
  Administered 2021-10-10: 40 ug/min via INTRAVENOUS

## 2021-10-10 SURGICAL SUPPLY — 70 items
APPLICATOR SURGIFLO ENDO (HEMOSTASIS) IMPLANT
BAG COUNTER SPONGE SURGICOUNT (BAG) IMPLANT
BAG LAPAROSCOPIC 12 15 PORT 16 (BASKET) IMPLANT
BAG RETRIEVAL 12/15 (BASKET) ×3
BLADE SURG SZ10 CARB STEEL (BLADE) IMPLANT
COVER BACK TABLE 60X90IN (DRAPES) ×3 IMPLANT
COVER TIP SHEARS 8 DVNC (MISCELLANEOUS) ×2 IMPLANT
COVER TIP SHEARS 8MM DA VINCI (MISCELLANEOUS) ×3
DECANTER SPIKE VIAL GLASS SM (MISCELLANEOUS) ×3 IMPLANT
DERMABOND ADVANCED (GAUZE/BANDAGES/DRESSINGS) ×1
DERMABOND ADVANCED .7 DNX12 (GAUZE/BANDAGES/DRESSINGS) ×2 IMPLANT
DRAPE ARM DVNC X/XI (DISPOSABLE) ×8 IMPLANT
DRAPE COLUMN DVNC XI (DISPOSABLE) ×2 IMPLANT
DRAPE DA VINCI XI ARM (DISPOSABLE) ×12
DRAPE DA VINCI XI COLUMN (DISPOSABLE) ×3
DRAPE SHEET LG 3/4 BI-LAMINATE (DRAPES) ×3 IMPLANT
DRAPE SURG IRRIG POUCH 19X23 (DRAPES) ×3 IMPLANT
DRSG OPSITE POSTOP 4X6 (GAUZE/BANDAGES/DRESSINGS) IMPLANT
DRSG OPSITE POSTOP 4X8 (GAUZE/BANDAGES/DRESSINGS) IMPLANT
ELECT PENCIL ROCKER SW 15FT (MISCELLANEOUS) IMPLANT
ELECT REM PT RETURN 15FT ADLT (MISCELLANEOUS) ×3 IMPLANT
GAUZE 4X4 16PLY ~~LOC~~+RFID DBL (SPONGE) ×3 IMPLANT
GLOVE SURG ENC MOIS LTX SZ6 (GLOVE) ×12 IMPLANT
GLOVE SURG ENC MOIS LTX SZ6.5 (GLOVE) ×6 IMPLANT
GOWN STRL REUS W/ TWL LRG LVL3 (GOWN DISPOSABLE) ×8 IMPLANT
GOWN STRL REUS W/TWL LRG LVL3 (GOWN DISPOSABLE) ×12
HOLDER FOLEY CATH W/STRAP (MISCELLANEOUS) IMPLANT
IRRIG SUCT STRYKERFLOW 2 WTIP (MISCELLANEOUS) ×3
IRRIGATION SUCT STRKRFLW 2 WTP (MISCELLANEOUS) ×2 IMPLANT
KIT PROCEDURE DA VINCI SI (MISCELLANEOUS) ×3
KIT PROCEDURE DVNC SI (MISCELLANEOUS) IMPLANT
KIT TURNOVER KIT A (KITS) IMPLANT
MANIPULATOR ADVINCU DEL 3.0 PL (MISCELLANEOUS) IMPLANT
MANIPULATOR ADVINCU DEL 3.5 PL (MISCELLANEOUS) ×1 IMPLANT
MANIPULATOR UTERINE 4.5 ZUMI (MISCELLANEOUS) IMPLANT
NDL HYPO 21X1.5 SAFETY (NEEDLE) ×2 IMPLANT
NDL SPNL 18GX3.5 QUINCKE PK (NEEDLE) IMPLANT
NEEDLE HYPO 21X1.5 SAFETY (NEEDLE) ×3 IMPLANT
NEEDLE SPNL 18GX3.5 QUINCKE PK (NEEDLE) IMPLANT
OBTURATOR OPTICAL STANDARD 8MM (TROCAR) ×3
OBTURATOR OPTICAL STND 8 DVNC (TROCAR) ×2
OBTURATOR OPTICALSTD 8 DVNC (TROCAR) ×2 IMPLANT
PACK ROBOT GYN CUSTOM WL (TRAY / TRAY PROCEDURE) ×3 IMPLANT
PAD POSITIONING PINK XL (MISCELLANEOUS) ×3 IMPLANT
PORT ACCESS TROCAR AIRSEAL 12 (TROCAR) ×2 IMPLANT
PORT ACCESS TROCAR AIRSEAL 5M (TROCAR) ×1
POUCH SPECIMEN RETRIEVAL 10MM (ENDOMECHANICALS) ×1 IMPLANT
SCRUB EXIDINE 4% CHG 4OZ (MISCELLANEOUS) ×3 IMPLANT
SEAL CANN UNIV 5-8 DVNC XI (MISCELLANEOUS) ×8 IMPLANT
SEAL XI 5MM-8MM UNIVERSAL (MISCELLANEOUS) ×12
SET TRI-LUMEN FLTR TB AIRSEAL (TUBING) ×3 IMPLANT
SPONGE T-LAP 18X18 ~~LOC~~+RFID (SPONGE) IMPLANT
SURGIFLO W/THROMBIN 8M KIT (HEMOSTASIS) IMPLANT
SUT MNCRL AB 4-0 PS2 18 (SUTURE) ×2 IMPLANT
SUT PDS AB 1 TP1 96 (SUTURE) IMPLANT
SUT VIC AB 0 CT1 27 (SUTURE)
SUT VIC AB 0 CT1 27XBRD ANTBC (SUTURE) IMPLANT
SUT VIC AB 2-0 CT1 27 (SUTURE)
SUT VIC AB 2-0 CT1 TAPERPNT 27 (SUTURE) IMPLANT
SUT VIC AB 2-0 SH 27 (SUTURE) ×9
SUT VIC AB 2-0 SH 27X BRD (SUTURE) IMPLANT
SUT VIC AB 4-0 PS2 18 (SUTURE) ×6 IMPLANT
SYR 10ML LL (SYRINGE) IMPLANT
TOWEL OR NON WOVEN STRL DISP B (DISPOSABLE) IMPLANT
TRAP SPECIMEN MUCUS 40CC (MISCELLANEOUS) IMPLANT
TRAY FOLEY MTR SLVR 16FR STAT (SET/KITS/TRAYS/PACK) ×3 IMPLANT
TROCAR XCEL NON-BLD 5MMX100MML (ENDOMECHANICALS) IMPLANT
UNDERPAD 30X36 HEAVY ABSORB (UNDERPADS AND DIAPERS) ×6 IMPLANT
WATER STERILE IRR 1000ML POUR (IV SOLUTION) ×3 IMPLANT
YANKAUER SUCT BULB TIP 10FT TU (MISCELLANEOUS) IMPLANT

## 2021-10-10 NOTE — Anesthesia Preprocedure Evaluation (Addendum)
Anesthesia Evaluation  Patient identified by MRN, date of birth, ID band Patient awake    Reviewed: Allergy & Precautions, NPO status , Patient's Chart, lab work & pertinent test results  History of Anesthesia Complications Negative for: history of anesthetic complications  Airway Mallampati: III  TM Distance: <3 FB Neck ROM: Full    Dental  (+) Dental Advisory Given, Teeth Intact   Pulmonary neg pulmonary ROS,    breath sounds clear to auscultation       Cardiovascular hypertension, Pt. on medications (-) angina(-) Past MI and (-) CHF  Rhythm:Regular     Neuro/Psych negative neurological ROS  negative psych ROS   GI/Hepatic negative GI ROS, Neg liver ROS,   Endo/Other  diabetes  Renal/GU Renal InsufficiencyRenal diseaseLab Results      Component                Value               Date                      CREATININE               1.12 (H)            10/10/2021                Musculoskeletal  (+) Arthritis ,   Abdominal   Peds  Hematology negative hematology ROS (+)   Anesthesia Other Findings   Reproductive/Obstetrics                             Anesthesia Physical Anesthesia Plan  ASA: 2  Anesthesia Plan: General   Post-op Pain Management:    Induction: Intravenous  PONV Risk Score and Plan: 3 and Ondansetron and Dexamethasone  Airway Management Planned: Oral ETT  Additional Equipment: None  Intra-op Plan:   Post-operative Plan: Extubation in OR  Informed Consent: I have reviewed the patients History and Physical, chart, labs and discussed the procedure including the risks, benefits and alternatives for the proposed anesthesia with the patient or authorized representative who has indicated his/her understanding and acceptance.     Dental advisory given  Plan Discussed with: CRNA and Anesthesiologist  Anesthesia Plan Comments:         Anesthesia Quick  Evaluation

## 2021-10-10 NOTE — Anesthesia Procedure Notes (Signed)
Procedure Name: Intubation Date/Time: 10/10/2021 2:22 PM Performed by: Milford Cage, CRNA Pre-anesthesia Checklist: Patient identified, Emergency Drugs available, Suction available and Patient being monitored Patient Re-evaluated:Patient Re-evaluated prior to induction Oxygen Delivery Method: Circle system utilized Preoxygenation: Pre-oxygenation with 100% oxygen Induction Type: IV induction Ventilation: Mask ventilation without difficulty Laryngoscope Size: Miller and 2 Grade View: Grade I Tube type: Oral Tube size: 7.5 mm Number of attempts: 1 Airway Equipment and Method: Stylet Placement Confirmation: ETT inserted through vocal cords under direct vision, positive ETCO2 and breath sounds checked- equal and bilateral Secured at: 22 cm Tube secured with: Tape Dental Injury: Teeth and Oropharynx as per pre-operative assessment

## 2021-10-10 NOTE — Transfer of Care (Signed)
Immediate Anesthesia Transfer of Care Note  Patient: Rosie Torrez [facility]  Procedure(s) Performed: XI ROBOTIC ASSISTED TOTAL HYSTERECTOMY WITH BILATERAL SALPINGO OOPHORECTOMY;CYSTOSCOPY (Bilateral) SENTINEL NODE BIOPSY LYMPH NODE DISSECTION  Patient Location: PACU  Anesthesia Type:General  Level of Consciousness: awake  Airway & Oxygen Therapy: Patient Spontanous Breathing and Patient connected to face mask oxygen  Post-op Assessment: Report given to RN and Post -op Vital signs reviewed and stable  Post vital signs: Reviewed and stable  Last Vitals:  Vitals Value Taken Time  BP 104/55 10/10/21 1740  Temp 36.4 C 10/10/21 1740  Pulse 71 10/10/21 1743  Resp 13 10/10/21 1744  SpO2 99 % 10/10/21 1743  Vitals shown include unvalidated device data.  Last Pain:  Vitals:   10/10/21 1152  TempSrc:   PainSc: 0-No pain         Complications: No notable events documented.

## 2021-10-10 NOTE — Op Note (Signed)
OPERATIVE NOTE  Pre-operative Diagnosis: endometrial cancer grade 2  Post-operative Diagnosis: same  Operation: Robotic-assisted laparoscopic total hysterectomy with bilateral salpingoophorectomy, SLN biopsy bilateral, cystoscopy, repair of vaginal laceration  Surgeon: Jeral Pinch MD  Assistant Surgeon: Lahoma Crocker MD (an MD assistant was necessary for tissue manipulation, management of robotic instrumentation, retraction and positioning due to the complexity of the case and hospital policies).   Anesthesia: GET  Urine Output: 600cc  Operative Findings: On EUA, small mobile uterus. On intra-abdminal entry, some adhesions of omentum to the anterior abdominal wall in the mid right abdomen, filmy.  Normal liver, diaphragm, stomach.  Normal small and large bowel.  Uterus approximately 8 cm in size with a 1-2 cm fundal left fibroid.  Ovaries unremarkable although larger than would expect in a postmenopausal patient.  Normal-appearing fallopian tubes although disrupted.  Clip within the pelvis noted on bladder peritoneum.  Mapping successful bilaterally to external iliac sentinel lymph nodes.  No obvious evidence of extrauterine disease.  Right ovary adherent to the posterior uterus and broad ligament, rectum somewhat adherent posteriorly within the cul-de-sac.  Area of questionable deserosalization of the rectum was oversewn, negative bubble test.  Given significant adhesions of the bladder to the cervix, cystoscopy was performed, bladder and dome intact, good efflux noted from bilateral ureteral orifices.  Estimated Blood Loss: 100 cc      Total IV Fluids: See I&O flowsheet         Specimens: uterus, cervix, bilateral tubes and ovaries, bilateral external iliac sentinel lymph nodes.         Complications:  None apparent; patient tolerated the procedure well.         Disposition: PACU - hemodynamically stable.  Procedure Details  The patient was seen in the Holding Room. The  risks, benefits, complications, treatment options, and expected outcomes were discussed with the patient.  The patient concurred with the proposed plan, giving informed consent.  The site of surgery properly noted/marked. The patient was identified as [facility] and the procedure verified as a Robotic-assisted hysterectomy with bilateral salpingo oophorectomy with SLN biopsy.   After induction of anesthesia, the patient was draped and prepped in the usual sterile manner. Patient was placed in supine position after anesthesia and draped and prepped in the usual sterile manner as follows: Her arms were tucked to her side with all appropriate precautions.  The shoulders were stabilized with padded shoulder blocks applied to the acromium processes.  The patient was placed in the semi-lithotomy position in [place].  The perineum and vagina were prepped with CholoraPrep. The patient was draped after the CholoraPrep had been allowed to dry for 3 minutes.  A Time Out was held and the above information confirmed.  The urethra was prepped with Betadine. Foley catheter was placed.  A sterile speculum was placed in the vagina.  The cervix was grasped with a single-tooth tenaculum. 2mg  total of ICG was injected into the cervical stroma at 2 and 9 o'clock with 1cc injected at a 1cm and 79mm depth (concentration 0.5mg /ml) in all locations. The cervix was dilated with Kennon Rounds dilators.  The 3.5 cm delineator uterine manipulator with a colpotomizer ring was placed without difficulty.  A pneum occluder balloon was placed over the manipulator.  OG tube placement was confirmed and to suction.   Next, a 10 mm skin incision was made 1 cm below the subcostal margin in the midclavicular line.  The 5 mm Optiview port and scope was used for direct entry.  Opening pressure was under 10 mm CO2.  The abdomen was insufflated and the findings were noted as above.   At this point and all points during the procedure, the patient's  intra-abdominal pressure did not exceed 15 mmHg. Next, an 8 mm skin incision was made superior to the umbilicus and a right and left port were placed about 8 cm lateral to the robot port on the right and left side.  A fourth arm was placed on the right.  The 5 mm assist trocar was exchanged for a 10-12 mm port. All ports were placed under direct visualization.  The patient was placed in steep Trendelenburg.  Filmy adhesions of the omentum to the anterior abdominal wall were lysed sharply and with short bursts of electrocautery using monopolar laparoscopic scissors.  The bowel was folded away into the upper abdomen.  The robot was docked in the normal manner.  The right and left peritoneum were opened parallel to the IP ligament to open the retroperitoneal spaces bilaterally. The round ligaments were transected. The SLN mapping was performed in bilateral pelvic basins. After identifying the ureters, the para rectal and paravesical spaces were opened up entirely with careful dissection below the level of the ureters bilaterally and to the depth of the uterine artery origin in order to skeletonize the uterine "web" and ensure visualization of all parametrial channels. The para-aortic basins were carefully exposed and evaluated for isolated para-aortic SLN's. Lymphatic channels were identified travelling to the following visualized sentinel lymph node's: Bilateral external iliac sentinel lymph nodes. These SLN's were separated from their surrounding lymphatic tissue, removed and sent for permanent pathology.  The hysterectomy was started.  The ureter was again noted to be on the medial leaf of the broad ligament.  The peritoneum above the ureter was incised and stretched and the infundibulopelvic ligament was skeletonized, cauterized and cut.  The posterior peritoneum was taken down to the level of the KOH ring.  On the right, there was thick scar tissue along the medial broad ligament inferior to where the ovary  was adherent and rectum noted to be adherent posteriorly.  There rectovaginal space was opened to drop the rectum.  The anterior peritoneum was also taken down.  Significant adhesions were noted between the bladder and cervix.  The bladder flap was created to the level of the KOH ring.  The uterine artery on the right side was skeletonized, cauterized and cut in the normal manner.  A similar procedure was performed on the left.  The colpotomy was made and the uterus, cervix, bilateral ovaries and tubes were amputated and delivered through the vagina.  Pedicles were inspected and excellent hemostasis was achieved.    The colpotomy at the vaginal cuff was closed with Vicryl on a CT1 needle in running manner.  Irrigation was used and excellent hemostasis was achieved.  Several interrupted sutures of 2-0 Vicryl were placed along the bladder where some serosal injury was suspected.  Good hemostasis was noted after.  Rectum was again examined and given a small area with possible serosal injury, several interrupted sutures using 2-0 Vicryl were placed to reinforce the serosa.  At this point the patient was then taken out of Trendelenburg, the pelvis was filled with sterile fluid, and a bubble test was performed which was negative.  Fluid was then suctioned out of the pelvis and hemostasis confirmed again.  Foley catheter was removed.  Cystoscopy was performed after the bladder was instilled with approximately 300cc of sterile fluid.  Nordstrom  noted to be intact.  Good efflux noted from the left ureter, after some time of watching the right ureter, good efflux also noted.  At this point in the procedure was completed.  Robotic instruments were removed under direct visulaization.  The robot was undocked. The fascia at the 10-12 mm port was closed with 0 Vicryl on a UR-5 needle.  The subcuticular tissue was closed with 4-0 Vicryl and the skin was closed with 4-0 Monocryl in a subcuticular manner.  Dermabond was applied.     Tear noted posteriorly along the vagina which was repaired with running 2-0 Vicryl.  The vagina was swabbed with  minimal bleeding noted.   All sponge, lap and needle counts were correct x  3.   The patient was transferred to the recovery room in stable condition.  Jeral Pinch, MD

## 2021-10-10 NOTE — Discharge Instructions (Addendum)
10/10/2021  We are sending a potassium supplement for you to take for the next few days.  Please come in to the cancer center on 1/4 at scheduled time for lab work to check potassium level.  Activity: 1. Be up and out of the bed during the day.  Take a nap if needed.  You may walk up steps but be careful and use the hand rail.  Stair climbing will tire you more than you think, you may need to stop part way and rest.   2. No lifting or straining for 6 weeks.  3. No driving for 1-2 weeks.  Do Not drive if you are taking narcotic pain medicine.  4. Shower daily.  Use soap and water on your incision and pat dry; don't rub.   5. No sexual activity and nothing in the vagina for 8 weeks.  Medications:  - Take ibuprofen and tylenol first line for pain control. Take these regularly (every 6 hours) to decrease the build up of pain.  - If necessary, for severe pain not relieved by ibuprofen, take tramadol.  - While taking tramadol you should take sennakot every night to reduce the likelihood of constipation. If this causes diarrhea, stop its use.  Diet: 1. Low sodium Heart Healthy Diet is recommended.  2. It is safe to use a laxative if you have difficulty moving your bowels.   Wound Care: 1. Keep clean and dry.  Shower daily.  Reasons to call the Doctor:  Fever - Oral temperature greater than 100.4 degrees Fahrenheit Foul-smelling vaginal discharge Difficulty urinating Nausea and vomiting Increased pain at the site of the incision that is unrelieved with pain medicine. Difficulty breathing with or without chest pain New calf pain especially if only on one side Sudden, continuing increased vaginal bleeding with or without clots.   Follow-up: 1. See Jeral Pinch in 3 weeks, on 1/20.  Contacts: For questions or concerns you should contact:  Dr. Jeral Pinch at [phone] After hours and on week-ends call [phone] and ask to speak to the physician on call for  Gynecologic Oncology

## 2021-10-10 NOTE — Interval H&P Note (Signed)
History and Physical Interval Note:  10/10/2021 1:27 PM  Becky Dudley  has presented today for surgery, with the diagnosis of ENDOMETRIAL CANCER.  The various methods of treatment have been discussed with the patient and family. After consideration of risks, benefits and other options for treatment, the patient has consented to  Procedure(s): XI ROBOTIC ASSISTED TOTAL HYSTERECTOMY WITH BILATERAL SALPINGO OOPHORECTOMY;POSSIBLE LAPAROTOMY (Bilateral) SENTINEL NODE BIOPSY (N/A) LYMPH NODE DISSECTION (N/A) as a surgical intervention.  The patient's history has been reviewed, patient examined, no change in status, stable for surgery.  I have reviewed the patient's chart and labs.  Questions were answered to the patient's satisfaction.     [name]

## 2021-10-11 ENCOUNTER — Telehealth: Payer: Self-pay

## 2021-10-11 ENCOUNTER — Encounter (HOSPITAL_COMMUNITY): Payer: Self-pay | Admitting: Gynecologic Oncology

## 2021-10-11 NOTE — Telephone Encounter (Signed)
Spoke with Becky Dudley this morning. She states she is eating, drinking and urinating well. She has had a BM this morning and is not passing gas. She was encouraged to keep drinking plenty of water. She denies fever or chills. Incisions are dry and intact. She rates her pain 0/10. She states she has taken (1) tylenol tablet this morning.   Instructed to call office with any fever, chills, purulent drainage, uncontrolled pain or any other questions or concerns. Patient verbalizes understanding.   Pt aware of post op appointments as well as the office number [phone] and after hours number [phone] to call if she has any questions or concerns

## 2021-10-14 NOTE — Anesthesia Postprocedure Evaluation (Signed)
Anesthesia Post Note  Patient: Becky Dudley  Procedure(s) Performed: XI ROBOTIC ASSISTED TOTAL HYSTERECTOMY WITH BILATERAL SALPINGO OOPHORECTOMY;CYSTOSCOPY (Bilateral) SENTINEL NODE BIOPSY LYMPH NODE DISSECTION     Patient location during evaluation: PACU Anesthesia Type: General Level of consciousness: awake and alert Pain management: pain level controlled Vital Signs Assessment: post-procedure vital signs reviewed and stable Respiratory status: spontaneous breathing, nonlabored ventilation, respiratory function stable and patient connected to nasal cannula oxygen Cardiovascular status: blood pressure returned to baseline and stable Postop Assessment: no apparent nausea or vomiting Anesthetic complications: no   No notable events documented.  Last Vitals:  Vitals:   10/10/21 1800 10/10/21 1815  BP: (!) 108/54 108/66  Pulse: 74 66  Resp: 14 14  Temp:  36.6 C  SpO2: 97% 98%    Last Pain:  Vitals:   10/10/21 1815  TempSrc:   PainSc: 0-No pain                 Josey Forcier

## 2021-10-15 NOTE — Interval H&P Note (Signed)
History and Physical Interval Note:  10/15/2021 1:30 PM  Becky Dudley  has presented today for surgery, with the diagnosis of ENDOMETRIAL CANCER.  The various methods of treatment have been discussed with the patient and family. After consideration of risks, benefits and other options for treatment, the patient has consented to  Procedure(s): XI ROBOTIC ASSISTED TOTAL HYSTERECTOMY WITH BILATERAL SALPINGO OOPHORECTOMY;CYSTOSCOPY (Bilateral) SENTINEL NODE BIOPSY (N/A) LYMPH NODE DISSECTION (N/A) as a surgical intervention.  The patient's history has been reviewed, patient examined, no change in status, stable for surgery.  I have reviewed the patient's chart and labs.  Questions were answered to the patient's satisfaction.     [name]

## 2021-10-16 ENCOUNTER — Inpatient Hospital Stay: Payer: 59 | Attending: Gynecologic Oncology

## 2021-10-16 ENCOUNTER — Other Ambulatory Visit: Payer: Self-pay

## 2021-10-16 ENCOUNTER — Telehealth: Payer: Self-pay

## 2021-10-16 ENCOUNTER — Inpatient Hospital Stay: Payer: 59 | Admitting: Gynecologic Oncology

## 2021-10-16 DIAGNOSIS — C541 Malignant neoplasm of endometrium: Secondary | ICD-10-CM | POA: Diagnosis present

## 2021-10-16 DIAGNOSIS — E876 Hypokalemia: Secondary | ICD-10-CM

## 2021-10-16 LAB — BASIC METABOLIC PANEL - CANCER CENTER ONLY
Anion gap: 5 (ref 5–15)
BUN: 16 mg/dL (ref 8–23)
CO2: 29 mmol/L (ref 22–32)
Calcium: 9.4 mg/dL (ref 8.9–10.3)
Chloride: 110 mmol/L (ref 98–111)
Creatinine: 1.2 mg/dL — ABNORMAL HIGH (ref 0.44–1.00)
GFR, Estimated: 48 mL/min — ABNORMAL LOW
Glucose, Bld: 100 mg/dL — ABNORMAL HIGH (ref 70–99)
Potassium: 4.1 mmol/L (ref 3.5–5.1)
Sodium: 144 mmol/L (ref 135–145)

## 2021-10-16 NOTE — Telephone Encounter (Signed)
Spoke with Ms. Bertucci this afternoon and reviewed recent BMP results. Per Dr. Berline Lopes potassium (4.1) looks great and kidney function is back to where it was before surgery.  Patient verbalized understanding. She states she has not started taking her hydrochlorothiazide since surgery and is going to talk to her PCP Dr. Lindell Noe about this.  Advised patient that the pathology from her surgery is not back yet and her telephone appointment for today has been rescheduled for tomorrow 10/17/21 at 4:05pm. Patient verbalized understanding.   Patient reports doing well overall since surgery. She has not had much pain but has been struggling with vertigo. She reports upon coming home from the hospital having dizziness, nausea and vomiting. She has been taking dramamine (one tablet in the AM and one in the PM) which she states has helped. She feels it is getting progressively better each day and she no longer has nausea. Advised patient that Dr. Berline Lopes will be notified.  Instructed to call with any questions or concerns.

## 2021-10-17 ENCOUNTER — Inpatient Hospital Stay: Payer: 59 | Admitting: Gynecologic Oncology

## 2021-10-18 ENCOUNTER — Inpatient Hospital Stay (HOSPITAL_BASED_OUTPATIENT_CLINIC_OR_DEPARTMENT_OTHER): Payer: 59 | Admitting: Gynecologic Oncology

## 2021-10-18 ENCOUNTER — Encounter: Payer: Self-pay | Admitting: Gynecologic Oncology

## 2021-10-18 ENCOUNTER — Telehealth: Payer: Self-pay

## 2021-10-18 ENCOUNTER — Encounter: Payer: Self-pay | Admitting: Oncology

## 2021-10-18 ENCOUNTER — Other Ambulatory Visit: Payer: Self-pay

## 2021-10-18 DIAGNOSIS — Z90722 Acquired absence of ovaries, bilateral: Secondary | ICD-10-CM

## 2021-10-18 DIAGNOSIS — Z7189 Other specified counseling: Secondary | ICD-10-CM

## 2021-10-18 DIAGNOSIS — C541 Malignant neoplasm of endometrium: Secondary | ICD-10-CM

## 2021-10-18 DIAGNOSIS — R42 Dizziness and giddiness: Secondary | ICD-10-CM

## 2021-10-18 DIAGNOSIS — Z90710 Acquired absence of both cervix and uterus: Secondary | ICD-10-CM

## 2021-10-18 NOTE — Progress Notes (Signed)
Gynecologic Oncology Telehealth Consult Note: Gyn-Onc  I connected with Becky Dudley on 10/18/21 at  2:30 PM EST by telephone and verified that I am speaking with the correct person using two identifiers.  I discussed the limitations, risks, security and privacy concerns of performing an evaluation and management service by telemedicine and the availability of in-person appointments. I also discussed with the patient that there may be a patient responsible charge related to this service. The patient expressed understanding and agreed to proceed.  Other persons participating in the visit and their role in the encounter: None.  Patient's location: Home Provider's location: [facility]  Reason for Visit: Follow-up after recent surgery, treatment discussion  Treatment History: Oncology History Overview Note  MSI and MMR IHC pending   Endometrial cancer ([place])  09/30/2021 Initial Biopsy   Hysteroscopy D&C, and MyoSure resection of anterior wall endometrial polyp (noted to be incomplete).  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   10/04/2021 Initial Diagnosis   Endometrial cancer ([place])   10/09/2021 Imaging   CT A/P: IMPRESSION: Endometrial thickening measuring 10 mm, consistent with known history of endometrial carcinoma.   2 cm subserosal fibroid in anterior uterine fundus.   No evidence of metastatic disease within the abdomen or pelvis.   Colonic diverticulosis, without radiographic evidence of diverticulitis.   Mild hepatic steatosis.   Aortic Atherosclerosis   10/10/2021 Surgery   TRH/BSO, SLN biopsy, cysto, repair vaginal laceration  Findings: On EUA, small mobile uterus. On intra-abdminal entry, some adhesions of omentum to the anterior abdominal wall in the mid right abdomen, filmy.  Normal liver, diaphragm, stomach.  Normal small and large bowel.  Uterus approximately 8 cm in size with a 1-2 cm fundal left  fibroid.  Ovaries unremarkable although larger than would expect in a postmenopausal patient.  Normal-appearing fallopian tubes although disrupted.  Clip within the pelvis noted on bladder peritoneum.  Mapping successful bilaterally to external iliac sentinel lymph nodes.  No obvious evidence of extrauterine disease.  Right ovary adherent to the posterior uterus and broad ligament, rectum somewhat adherent posteriorly within the cul-de-sac.  Area of questionable deserosalization of the rectum was oversewn, negative bubble test.  Given significant adhesions of the bladder to the cervix, cystoscopy was performed, bladder and dome intact, good efflux noted from bilateral ureteral orifices.   10/10/2021 Pathology Results   A. SENTINEL LYMPH NODE, RIGHT EXTERNAL ILIAC, BIOPSY:  -  No carcinoma identified in one lymph node (0/1)  -  See comment   B. SENTINEL LYMPH NODE, LEFT EXTERNAL ILIAC, BIOPSY:  -  No carcinoma identified in one lymph node (0/1)  -  See comment   C. UTERUS, CERVIX, BILATERAL TUBES AND OVARIES:  Uterus:  -  Endometrioid carcinoma, FIGO grade 2, largely involving an  endometrial polyp  -  Benign endometrial polyp  -  Leiomyomata (2.2 cm; largest)  -  Adenomyosis  -  See oncology table and comment below   Cervix:  -  No carcinoma identified   Bilateral Ovaries:  -  No carcinoma identified   Bilateral Fallopian tubes:  -  No carcinoma identified   ONCOLOGY TABLE:   UTERUS, CARCINOMA OR CARCINOSARCOMA: Resection   Procedure: Total hysterectomy and bilateral salpingo-oophorectomy  Histologic Type: Endometrioid carcinoma, NOS  Histologic Grade: FIGO grade 2  Myometrial Invasion:       Depth of Myometrial Invasion (mm): 2       Myometrial Thickness (mm): 22  Percentage of Myometrial Invasion: Less than 50%  Uterine Serosa Involvement: Not identified  Cervical stromal Involvement: Not identified  Extent of involvement of other tissue/organs: Not identified   Peritoneal/Ascitic Fluid: Not submitted unknown  Lymphovascular Invasion: Not identified  Regional Lymph Nodes:       Pelvic Lymph Nodes Examined:  2 Sentinel                                0 non-sentinel                                   2 total       Pelvic Lymph Nodes with Metastasis: 0                           Macrometastasis: (>2.0 mm): 0                          Micrometastasis: (>0.2 mm and < 2.0 mm): 0                            Isolated Tumor Cells (<0.2 mm): 0                       Laterality of Lymph Node with Tumor: N/A                             Extracapsular Extension: N/A       Para-aortic Lymph Nodes Examined: N/A  Distant Metastasis:       Distant Site(s) Involved: N/A  Pathologic Stage Classification (pTNM, AJCC 8th Edition): pT1a, pN0  Ancillary Studies: MMR / MSI testing will be ordered  Representative Tumor Block: C6  Comment(s): See above     Interval History: Overall doing well since surgery.  Had fairly significant vertigo symptoms the evening after her surgery.  This resolved by the next day but has had some lingering vertigo issues, more with balance.  She had a couple of episodes of vertigo several years ago.  She feels that overall things are continuing to get better and she feels significantly better today.  She denies any abdominal or pelvic pain.  Denies any vaginal bleeding or discharge.  Reports good bowel and bladder function.  Tolerating p.o. intake without issues.  Past Medical/Surgical History: Past Medical History:  Diagnosis Date   Arthritis    both knees   CKD (chronic kidney disease), stage III ([place])    no nephrologist   Diabetes mellitus without complication (HCC)    DM2, injection once a week; cbgs normally 110-130   Essential hypertension    Gout    Mixed hyperlipidemia    Morbid obesity ([place])    Pure hypercholesterolemia    uterine ca 09/2021   Vitamin D deficiency    Wears glasses     Past Surgical History:  Procedure  Laterality Date   [place] N/A 09/30/2021   Procedure: DILATATION & CURETTAGE/HYSTEROSCOPY WITH MYOSURE;  Surgeon: Brien Few, MD;  Location: [facility];  Service: Gynecology;  Laterality: N/A;   EYE SURGERY     cataract surgery, lens implant on right eye  LASIK Bilateral 2002   LYMPH NODE DISSECTION N/A 10/10/2021   Procedure: LYMPH NODE DISSECTION;  Surgeon: [name], MD;  Location: [facility];  Service: Gynecology;  Laterality: N/A;   ROBOTIC ASSISTED TOTAL HYSTERECTOMY WITH BILATERAL SALPINGO OOPHERECTOMY Bilateral 10/10/2021   Procedure: XI ROBOTIC ASSISTED TOTAL HYSTERECTOMY WITH BILATERAL SALPINGO OOPHORECTOMY;CYSTOSCOPY;  Surgeon: [name], MD;  Location: [facility];  Service: Gynecology;  Laterality: Bilateral;   SENTINEL NODE BIOPSY N/A 10/10/2021   Procedure: SENTINEL NODE BIOPSY;  Surgeon: [name], MD;  Location: [facility];  Service: Gynecology;  Laterality: N/A;   TONSILLECTOMY  1955    Family History  Problem Relation Age of Onset   Breast cancer Paternal Aunt    Uterine cancer Paternal Aunt    Lung cancer Maternal Uncle    Colon cancer Neg Hx    Ovarian cancer Neg Hx    Endometrial cancer Neg Hx    Pancreatic cancer Neg Hx    Prostate cancer Neg Hx     Social History   Socioeconomic History   Marital status: Single    Spouse name: Not on file   Number of children: Not on file   Years of education: Not on file   Highest education level: Not on file  Occupational History   Not on file  Tobacco Use   Smoking status: Never   Smokeless tobacco: Never  Vaping Use   Vaping Use: Never used  Substance and Sexual Activity   Alcohol use: Never   Drug use: Never   Sexual activity: Not Currently  Other Topics Concern   Not on file  Social History Narrative   Not on file   Social Determinants of Health   Financial Resource Strain: Not on file  Food  Insecurity: Not on file  Transportation Needs: Not on file  Physical Activity: Not on file  Stress: Not on file  Social Connections: Not on file    Current Medications:  Current Outpatient Medications:    acetaminophen (TYLENOL) 325 MG tablet, Take 650 mg by mouth every 6 (six) hours as needed for mild pain., Disp: , Rfl:    amLODipine (NORVASC) 10 MG tablet, Take 10 mg by mouth at bedtime., Disp: , Rfl:    aspirin 81 MG chewable tablet, Chew 81 mg by mouth daily., Disp: , Rfl:    BINAXNOW COVID-19 AG HOME TEST KIT, TEST AS DIRECTED TODAY (Patient not taking: Reported on 10/03/2021), Disp: , Rfl:    Cholecalciferol (VITAMIN D3) 125 MCG (5000 UT) TABS, Take 5,000 Units by mouth daily., Disp: , Rfl:    diclofenac Sodium (VOLTAREN) 1 % GEL, Apply 2 g topically daily as needed (Arthritis). For knees, Disp: , Rfl:    hydrochlorothiazide (MICROZIDE) 12.5 MG capsule, Take 12.5 mg by mouth daily., Disp: , Rfl:    loratadine (CLARITIN) 10 MG tablet, Take 10 mg by mouth daily as needed for allergies or rhinitis., Disp: , Rfl:    Menthol, Topical Analgesic, (BIOFREEZE ROLL-ON EX), Apply 1 application topically daily as needed (Back pain)., Disp: , Rfl:    naphazoline-pheniramine (VISINE) 0.025-0.3 % ophthalmic solution, 1 drop daily as needed (Dry eye)., Disp: , Rfl:    potassium chloride (KLOR-CON M) 10 MEQ tablet, Take 1 tablet (10 mEq total) by mouth 2 (two) times daily for 3 days., Disp: 6 tablet, Rfl: 0   potassium chloride SA (KLOR-CON M) 20 MEQ tablet, Take 1 tablet (20 mEq total) by mouth 2 (two) times daily., Disp: 2 tablet,  Rfl: 0   rosuvastatin (CRESTOR) 5 MG tablet, Take 5 mg by mouth at bedtime., Disp: , Rfl:    Semaglutide, 1 MG/DOSE, (OZEMPIC, 1 MG/DOSE,) 2 MG/1.5ML SOPN, Inject 1 mg into the skin once a week. Injection on Sundays., Disp: , Rfl:    senna-docusate (SENOKOT-S) 8.6-50 MG tablet, Take 2 tablets by mouth at bedtime. For AFTER surgery, do not take if having diarrhea, Disp: 30  tablet, Rfl: 0   sodium chloride (OCEAN) 0.65 % SOLN nasal spray, Place 1 spray into both nostrils as needed for congestion., Disp: , Rfl:    telmisartan (MICARDIS) 40 MG tablet, Take 40 mg by mouth daily., Disp: , Rfl:    traMADol (ULTRAM) 50 MG tablet, Take 1 tablet (50 mg total) by mouth every 6 (six) hours as needed. (Patient taking differently: Take 50 mg by mouth every 6 (six) hours as needed for moderate pain.), Disp: 20 tablet, Rfl: 0   UNABLE TO FIND, Apply 1 application topically daily as needed (Leg cramps). [place] OTC for leg cramps, Disp: , Rfl:    zinc gluconate 50 MG tablet, Take 25 mg by mouth daily., Disp: , Rfl:   Review of Symptoms: Pertinent positives as per HPI.  Physical Exam: There were no vitals taken for this visit. Deferred given limitations of phone visit.  Laboratory & Radiologic Studies: 12/29: Endometrioid carcinoma, FIGO grade 2, largely involving endometrial polyp Myometrial invasion 10% No LVI Sentinel lymph nodes negative  Assessment & Plan: Becky Dudley is a 71 y.o. 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  Patient is overall meeting postoperative milestones.  Discussed continued expectations and restrictions.  Recommended that she try Epley maneuver.  I have asked her to call me if her vertigo symptoms do not continue to improve.  Discussed final pathology report with her in detail.  Given age, she meets high-intermediate risk criteria.  Given this, discussed recommendation for vaginal brachytherapy to decrease the risk of vaginal cuff recurrence.  Will refer her to Dr. Sondra Come for consultation.  I discussed the assessment and treatment plan with the patient. The patient was provided with an opportunity to ask questions and all were answered. The patient agreed with the plan and demonstrated an understanding of the instructions.   The patient was advised to  call back or see an in-person evaluation if the symptoms worsen or if the condition fails to improve as anticipated.   18 minutes of total time was spent for this patient encounter, including preparation, face-to-face counseling with the patient and coordination of care, and documentation of the encounter.   Jeral Pinch, MD  Division of Gynecologic Oncology  Department of Obstetrics and Gynecology  [facility]

## 2021-10-18 NOTE — Progress Notes (Signed)
Referral placed for radiation oncology - Dr. Sondra Come - for brachytherapy.

## 2021-10-18 NOTE — Telephone Encounter (Signed)
FMLA paperwork completed and successfully faxed to [place] and [id number] [facility].

## 2021-10-22 NOTE — Progress Notes (Signed)
GYN Location of Tumor / Histology: Endometrial  Neomia Glass Widmer presented with symptoms of:  postmenopausal bleeding  Biopsies revealed:  Clinical History: Postmenopausal bleeding ([facility])  FINAL MICROSCOPIC DIAGNOSIS:  A. ENDOMETRIAL CURETTINGS:  - Endometrial adenocarcinoma, endometrioid type, FIGO grade 2.   A. SENTINEL LYMPH NODE, RIGHT EXTERNAL ILIAC, BIOPSY:  -  No carcinoma identified in one lymph node (0/1)  -  See comment   B. SENTINEL LYMPH NODE, LEFT EXTERNAL ILIAC, BIOPSY:  -  No carcinoma identified in one lymph node (0/1)  -  See comment   C. UTERUS, CERVIX, BILATERAL TUBES AND OVARIES:  Uterus:  -  Endometrioid carcinoma, FIGO grade 2, largely involving an  endometrial polyp  -  Benign endometrial polyp  -  Leiomyomata (2.2 cm; largest)  -  Adenomyosis  -  See oncology table and comment below   Cervix:  -  No carcinoma identified   Bilateral Ovaries:  -  No carcinoma identified   Bilateral Fallopian tubes:  -  No carcinoma identified   Past/Anticipated interventions by Gyn/Onc surgery, if any:  Operation: Robotic-assisted laparoscopic total hysterectomy with bilateral salpingoophorectomy, SLN biopsy bilateral, cystoscopy, repair of vaginal laceration Surgeon: Jeral Pinch MD  Past/Anticipated interventions by medical oncology, if any: not at this time  Weight changes, if any: no  Bowel/Bladder complaints, if any: Yes.  ,  nocturia x1  Nausea/Vomiting, if any: no  Pain issues, if any:  no  SAFETY ISSUES: Prior radiation? no Pacemaker/ICD? no Possible current pregnancy? no, hysterectomy Is the patient on methotrexate? no  Current Complaints / other details:  none  Vitals:   10/28/21 1345  BP: 121/74  Pulse: 70  Resp: 20  Temp: 97.9 F (36.6 C)  SpO2: 100%  Weight: 215 lb 3.2 oz (97.6 kg)  Height: 5\' 4"  (1.626 m)

## 2021-10-24 LAB — SURGICAL PATHOLOGY

## 2021-10-27 NOTE — Progress Notes (Signed)
Radiation Oncology         ([phone]) [phone] ________________________________  Initial Outpatient Consultation  Name: Becky Dudley MRN: [id number]  Date: 10/28/2021  DOB: 01-Mar-1951  PO:[id number], Anastasia Pall, MD  [name], MD   REFERRING PHYSICIAN: [name], MD  DIAGNOSIS: The encounter diagnosis was Endometrial cancer [facility]).  FIGO grade 2 endometrioid carcinoma; s/p hysterectomy and BSO, Stage pT1a, pN0  HISTORY OF PRESENT ILLNESS::Becky Dudley is a 71 y.o. female who is accompanied by daughter-in-law. she is seen as a courtesy of Dr. Berline Lopes for an opinion concerning radiation therapy as part of management for her recently diagnosed endometrial cancer. The patient first presented with post-menopausal bleeding in May of 2022. She then began having light vaginal spotting which she described as light bleeding that would last up to a day, and then no bleeding for several weeks at a time.  This patient reported that this pattern has repeated since then, without any increase in quantity or frequency of her bleeding.   Subsequently, the patient underwent endometrial curetting on 09/1921 under Dr. Ronita Hipps, which revealed FIGO grade 2 endometrial adenocarcinoma, endometrioid type.  The patient was then referred to Dr. Berline Lopes on 10/04/21 for further evaluation and recommendation regarding treatment. During this visit, the patient reported a healthy appetite and denied any urinary symptoms (though stated that she felt like the amount that she was urinating is less in volume during the week of this visit). Following discussion of her treatment options, Dr. Berline Lopes ultimately recommended robotic assisted hysterectomy with BSO, sentinel lymph node evaluation, possible lymph node dissection, and possible laparotomy. Dr. Berline Lopes also ordered pre-op imaging to rule out metastatic disease.   CT of the abdomen and pelvis on 10/09/21 showed endometrial thickening measuring 10 mm,  consistent with known history of endometrial carcinoma, and a 2 cm subserosal fibroid in the anterior uterine fundus. Otherwise, no evidence of metastatic disease within the abdomen or pelvis was appreciated.   The patient opted to proceed with hysterectomy, BSO, and nodal biopsies on 10/10/21 under the care of Dr. Berline Lopes. Pathology from the procedure revealed:  --Uterus: FIGO grade 2 endometrioid carcinoma largely involving an endometrial polyp; benign endometrial polyp; leiomyomata (largest measuring 2.2 cm); adenomyosis --Cervix, bilateral fallopian tubes, and ovaries: negative for carcinoma  --Right and left external iliac sentinel lymph nodes: negative for carcinoma  The patient most recently followed up with Dr. Berline Lopes on 10/18/21. During which time, the patient reported fairly significant vertigo symptoms the evening after her surgery, which had since resolved. Otherwise, she denied abdominal or pelvic pain, vaginal bleeding, or discharge. Given her high-intermediate risk criteria due to her age, Dr. Berline Lopes accordingly recommended vaginal brachytherapy to decrease the risk of vaginal cuff recurrence.  Of note: bilateral screening mammogram on 08/02/21 showed findings suggestive of calcifications in the right breast. Diagnostic right breast mammogram on 08/17/21 revealed the calcifications in the right breast as likely benign punctate calcifications. Follow-up diagnostic mammogram is recommended in 6 months to reassess.    PREVIOUS RADIATION THERAPY: No  PAST MEDICAL HISTORY:  Past Medical History:  Diagnosis Date   Arthritis    both knees   CKD (chronic kidney disease), stage III ([place])    no nephrologist   Diabetes mellitus without complication (HCC)    DM2, injection once a week; cbgs normally 110-130   Essential hypertension    Gout    Mixed hyperlipidemia    Morbid obesity ([place])    Pure hypercholesterolemia    uterine ca  09/2021   Vitamin D deficiency    Wears glasses      PAST SURGICAL HISTORY: Past Surgical History:  Procedure Laterality Date   CHOLECYSTECTOMY  1989   DILATATION & CURETTAGE/HYSTEROSCOPY WITH MYOSURE N/A 09/30/2021   Procedure: DILATATION & CURETTAGE/HYSTEROSCOPY WITH MYOSURE;  Surgeon: Brien Few, MD;  Location: [place];  Service: Gynecology;  Laterality: N/A;   EYE SURGERY     cataract surgery, lens implant on right eye   LASIK Bilateral 2002   LYMPH NODE DISSECTION N/A 10/10/2021   Procedure: LYMPH NODE DISSECTION;  Surgeon: [name], MD;  Location: [facility];  Service: Gynecology;  Laterality: N/A;   ROBOTIC ASSISTED TOTAL HYSTERECTOMY WITH BILATERAL SALPINGO OOPHERECTOMY Bilateral 10/10/2021   Procedure: XI ROBOTIC ASSISTED TOTAL HYSTERECTOMY WITH BILATERAL SALPINGO OOPHORECTOMY;CYSTOSCOPY;  Surgeon: [name], MD;  Location: [facility];  Service: Gynecology;  Laterality: Bilateral;   SENTINEL NODE BIOPSY N/A 10/10/2021   Procedure: SENTINEL NODE BIOPSY;  Surgeon: [name], MD;  Location: [facility];  Service: Gynecology;  Laterality: N/A;   TONSILLECTOMY  1953    FAMILY HISTORY:  Family History  Problem Relation Age of Onset   Breast cancer Paternal Aunt    Uterine cancer Paternal Aunt    Lung cancer Maternal Uncle    Colon cancer Neg Hx    Ovarian cancer Neg Hx    Endometrial cancer Neg Hx    Pancreatic cancer Neg Hx    Prostate cancer Neg Hx     SOCIAL HISTORY:  Social History   Tobacco Use   Smoking status: Never   Smokeless tobacco: Never  Vaping Use   Vaping Use: Never used  Substance Use Topics   Alcohol use: Never   Drug use: Never    ALLERGIES:  Allergies  Allergen Reactions   Lisinopril Cough    MEDICATIONS:  Current Outpatient Medications  Medication Sig Dispense Refill   acetaminophen (TYLENOL) 325 MG tablet Take 650 mg by mouth every 6 (six) hours as needed for mild pain.     amLODipine (NORVASC) 10 MG tablet Take 10 mg by mouth at bedtime.      aspirin 81 MG chewable tablet Chew 81 mg by mouth daily.     calcium carbonate (OSCAL) 1500 (600 Ca) MG TABS tablet Take 1,200 mg by mouth daily.     Cholecalciferol (VITAMIN D3) 125 MCG (5000 UT) TABS Take 5,000 Units by mouth daily.     diclofenac Sodium (VOLTAREN) 1 % GEL Apply 2 g topically daily as needed (Arthritis). For knees     loratadine (CLARITIN) 10 MG tablet Take 10 mg by mouth daily as needed for allergies or rhinitis.     Menthol, Topical Analgesic, (BIOFREEZE ROLL-ON EX) Apply 1 application topically daily as needed (Back pain).     naphazoline-pheniramine (VISINE) 0.025-0.3 % ophthalmic solution 1 drop daily as needed (Dry eye).     rosuvastatin (CRESTOR) 5 MG tablet Take 5 mg by mouth at bedtime.     Semaglutide, 1 MG/DOSE, (OZEMPIC, 1 MG/DOSE,) 2 MG/1.5ML SOPN Inject 1 mg into the skin once a week. Injection on Sundays.     sodium chloride (OCEAN) 0.65 % SOLN nasal spray Place 1 spray into both nostrils as needed for congestion.     telmisartan (MICARDIS) 40 MG tablet Take 40 mg by mouth daily.     UNABLE TO FIND Apply 1 application topically daily as needed (Leg cramps). [facility] OTC for leg cramps     zinc  gluconate 50 MG tablet Take 25 mg by mouth daily.     BINAXNOW COVID-19 AG HOME TEST KIT TEST AS DIRECTED TODAY (Patient not taking: Reported on 10/03/2021)     hydrochlorothiazide (MICROZIDE) 12.5 MG capsule Take 12.5 mg by mouth daily. (Patient not taking: Reported on 10/28/2021)     potassium chloride (KLOR-CON M) 10 MEQ tablet Take 1 tablet (10 mEq total) by mouth 2 (two) times daily for 3 days. 6 tablet 0   potassium chloride SA (KLOR-CON M) 20 MEQ tablet Take 1 tablet (20 mEq total) by mouth 2 (two) times daily. (Patient not taking: Reported on 10/28/2021) 2 tablet 0   senna-docusate (SENOKOT-S) 8.6-50 MG tablet Take 2 tablets by mouth at bedtime. For AFTER surgery, do not take if having diarrhea (Patient not taking: Reported on 10/28/2021) 30 tablet 0   traMADol (ULTRAM)  50 MG tablet Take 1 tablet (50 mg total) by mouth every 6 (six) hours as needed. (Patient not taking: Reported on 10/28/2021) 20 tablet 0   No current facility-administered medications for this encounter.    REVIEW OF SYSTEMS:  A 10+ POINT REVIEW OF SYSTEMS WAS OBTAINED including neurology, dermatology, psychiatry, cardiac, respiratory, lymph, extremities, GI, GU, musculoskeletal, constitutional, reproductive, HEENT.  She denies any pelvic pain vaginal bleeding or discharge since her surgery   PHYSICAL EXAM:  height is 5' 4"  (1.626 m) and weight is 215 lb 3.2 oz (97.6 kg). Her temperature is 97.9 F (36.6 C). Her blood pressure is 121/74 and her pulse is 70. Her respiration is 20 and oxygen saturation is 100%.   General: Alert and oriented, in no acute distress HEENT: Head is normocephalic. Extraocular movements are intact.  Neck: Neck is supple, no palpable cervical or supraclavicular lymphadenopathy. Heart: Regular in rate and rhythm with no murmurs, rubs, or gallops. Chest: Clear to auscultation bilaterally, with no rhonchi, wheezes, or rales. Abdomen: Soft, nontender, nondistended, with no rigidity or guarding. Extremities: No cyanosis or edema. Lymphatics: see Neck Exam Skin: No concerning lesions. Musculoskeletal: symmetric strength and muscle tone throughout. Neurologic: Cranial nerves II through XII are grossly intact. No obvious focalities. Speech is fluent. Coordination is intact. Psychiatric: Judgment and insight are intact. Affect is appropriate. Pelvic examination deferred in light of recent surgery.  The patient will see Dr. Berline Lopes later this week for exam and to assess for healing since her hysterectomy.    ECOG = 1  0 - Asymptomatic (Fully active, able to carry on all predisease activities without restriction)  1 - Symptomatic but completely ambulatory (Restricted in physically strenuous activity but ambulatory and able to carry out work of a light or sedentary nature. For  example, light housework, office work)  2 - Symptomatic, <50% in bed during the day (Ambulatory and capable of all self care but unable to carry out any work activities. Up and about more than 50% of waking hours)  3 - Symptomatic, >50% in bed, but not bedbound (Capable of only limited self-care, confined to bed or chair 50% or more of waking hours)  4 - Bedbound (Completely disabled. Cannot carry on any self-care. Totally confined to bed or chair)  5 - Death   Eustace Pen MM, Creech RH, Tormey DC, et al. [phone]). "Toxicity and response criteria of the [facility]". [place] Oncol. 5 (6): 649-55  LABORATORY DATA:  Lab Results  Component Value Date   WBC 7.6 10/09/2021   HGB 13.3 10/09/2021   HCT 40.0 10/09/2021   MCV 91.3 10/09/2021  PLT 275 10/09/2021   Lab Results  Component Value Date   NA 144 10/16/2021   K 4.1 10/16/2021   CL 110 10/16/2021   CO2 29 10/16/2021   GLUCOSE 100 (H) 10/16/2021   CREATININE 1.20 (H) 10/16/2021   CALCIUM 9.4 10/16/2021      RADIOGRAPHY: CT Abdomen Pelvis W Contrast  Result Date: 10/09/2021 CLINICAL DATA:  Newly diagnosed endometrial carcinoma.  Staging. EXAM: CT ABDOMEN AND PELVIS WITH CONTRAST TECHNIQUE: Multidetector CT imaging of the abdomen and pelvis was performed using the standard protocol following bolus administration of intravenous contrast. CONTRAST:  15m OMNIPAQUE IOHEXOL 350 MG/ML SOLN COMPARISON:  None. FINDINGS: Lower Chest: No acute findings. Hepatobiliary: No hepatic masses identified. Mild diffuse hepatic steatosis is noted. Prior cholecystectomy. No evidence of biliary obstruction. Pancreas:  No mass or inflammatory changes. Spleen: Within normal limits in size and appearance. Adrenals/Urinary Tract: No masses identified. No evidence of ureteral calculi or hydronephrosis. Stomach/Bowel: No evidence of obstruction, inflammatory process or abnormal fluid collections. Diverticulosis is seen mainly involving the  sigmoid colon, however there is no evidence of diverticulitis. Vascular/Lymphatic: No pathologically enlarged lymph nodes. No acute vascular findings. Aortic atherosclerotic calcification noted. Reproductive: A subserosal fibroid is seen in the anterior uterine fundus which measures approximately 2 cm. Endometrial thickening is seen measuring to 10 mm. Adnexal regions are unremarkable. No evidence of peritoneal thickening, omental caking, or free fluid. Other:  None. Musculoskeletal: No suspicious bone lesions identified. Lumbar spondylosis is seen with degenerative grade 1 anterolisthesis at L4-5. IMPRESSION: Endometrial thickening measuring 10 mm, consistent with known history of endometrial carcinoma. 2 cm subserosal fibroid in anterior uterine fundus. No evidence of metastatic disease within the abdomen or pelvis. Colonic diverticulosis, without radiographic evidence of diverticulitis. Mild hepatic steatosis. Aortic Atherosclerosis (ICD10-I70.0). Electronically Signed   By: JMarlaine HindM.D.   On: 10/09/2021 15:18      IMPRESSION: FIGO grade 2 endometrioid carcinoma; s/p hysterectomy and BSO, Stage pT1a, pN0.  Based on the patient's age and pathologic findings the patient would be at high intermediate risk for recurrence.  Based on these issues I would recommend vaginal brachytherapy to reduce the chances of vaginal cuff recurrence.  Today, I talked to the patient and family about the findings and work-up thus far.  We discussed the natural history of endometrial cancer and general treatment, highlighting the role of radiotherapy (high-dose-rate brachytherapy with iridium 192) in the management.  We discussed the available radiation techniques, and focused on the details of logistics and delivery.  We reviewed the anticipated acute and late sequelae associated with radiation in this setting.  The patient was encouraged to ask questions that I answered to the best of my ability.  A patient consent form was  discussed and signed.  We retained a copy for our records.    PLAN: Patient will be set up for initiation of vaginal brachytherapy to begin approximately 6 weeks postop.  Anticipate 5 high-dose-rate treatments directed at the vaginal cuff.  Iridium 192 will be the high-dose-rate source.   60 minutes of total time was spent for this patient encounter, including preparation, face-to-face counseling with the patient and coordination of care, physical exam, and documentation of the encounter.   ------------------------------------------------  JBlair Promise PhD, MD  This document serves as a record of services personally performed by JGery Pray MD. It was created on his behalf by ERoney Mans a trained medical scribe. The creation of this record is based on the scribe's personal observations and  the provider's statements to them. This document has been checked and approved by the attending provider.

## 2021-10-28 ENCOUNTER — Other Ambulatory Visit: Payer: Self-pay

## 2021-10-28 ENCOUNTER — Ambulatory Visit
Admission: RE | Admit: 2021-10-28 | Discharge: 2021-10-28 | Disposition: A | Discharge status: Home / Self Care | Payer: 59 | Source: Ambulatory Visit | Attending: Radiation Oncology | Admitting: Radiation Oncology

## 2021-10-28 ENCOUNTER — Encounter: Payer: Self-pay | Admitting: Radiation Oncology

## 2021-10-28 VITALS — BP 121/74 | HR 70 | Temp 97.9°F | Resp 20 | Ht 64.0 in | Wt 215.2 lb

## 2021-10-28 DIAGNOSIS — K760 Fatty (change of) liver, not elsewhere classified: Secondary | ICD-10-CM | POA: Diagnosis not present

## 2021-10-28 DIAGNOSIS — Z90710 Acquired absence of both cervix and uterus: Secondary | ICD-10-CM | POA: Diagnosis not present

## 2021-10-28 DIAGNOSIS — Z79899 Other long term (current) drug therapy: Secondary | ICD-10-CM | POA: Insufficient documentation

## 2021-10-28 DIAGNOSIS — E1122 Type 2 diabetes mellitus with diabetic chronic kidney disease: Secondary | ICD-10-CM | POA: Insufficient documentation

## 2021-10-28 DIAGNOSIS — I129 Hypertensive chronic kidney disease with stage 1 through stage 4 chronic kidney disease, or unspecified chronic kidney disease: Secondary | ICD-10-CM | POA: Diagnosis not present

## 2021-10-28 DIAGNOSIS — M4316 Spondylolisthesis, lumbar region: Secondary | ICD-10-CM | POA: Diagnosis not present

## 2021-10-28 DIAGNOSIS — C541 Malignant neoplasm of endometrium: Secondary | ICD-10-CM | POA: Diagnosis present

## 2021-10-28 DIAGNOSIS — Z90722 Acquired absence of ovaries, bilateral: Secondary | ICD-10-CM | POA: Insufficient documentation

## 2021-10-28 DIAGNOSIS — E7800 Pure hypercholesterolemia, unspecified: Secondary | ICD-10-CM | POA: Diagnosis not present

## 2021-10-28 DIAGNOSIS — E782 Mixed hyperlipidemia: Secondary | ICD-10-CM | POA: Insufficient documentation

## 2021-10-28 DIAGNOSIS — N840 Polyp of corpus uteri: Secondary | ICD-10-CM | POA: Diagnosis not present

## 2021-10-28 DIAGNOSIS — M5136 Other intervertebral disc degeneration, lumbar region: Secondary | ICD-10-CM | POA: Diagnosis not present

## 2021-10-28 DIAGNOSIS — K5730 Diverticulosis of large intestine without perforation or abscess without bleeding: Secondary | ICD-10-CM | POA: Diagnosis not present

## 2021-10-28 DIAGNOSIS — N183 Chronic kidney disease, stage 3 unspecified: Secondary | ICD-10-CM | POA: Diagnosis not present

## 2021-10-28 DIAGNOSIS — Z803 Family history of malignant neoplasm of breast: Secondary | ICD-10-CM | POA: Insufficient documentation

## 2021-10-28 DIAGNOSIS — M47816 Spondylosis without myelopathy or radiculopathy, lumbar region: Secondary | ICD-10-CM | POA: Insufficient documentation

## 2021-10-28 DIAGNOSIS — Z7982 Long term (current) use of aspirin: Secondary | ICD-10-CM | POA: Diagnosis not present

## 2021-10-28 DIAGNOSIS — R9389 Abnormal findings on diagnostic imaging of other specified body structures: Secondary | ICD-10-CM | POA: Diagnosis not present

## 2021-10-28 DIAGNOSIS — Z801 Family history of malignant neoplasm of trachea, bronchus and lung: Secondary | ICD-10-CM | POA: Insufficient documentation

## 2021-10-28 NOTE — Progress Notes (Signed)
See MD note for nursing evaluation. °

## 2021-10-30 ENCOUNTER — Encounter: Payer: Self-pay | Admitting: Gynecologic Oncology

## 2021-11-01 ENCOUNTER — Encounter: Payer: Self-pay | Admitting: Gynecologic Oncology

## 2021-11-01 ENCOUNTER — Other Ambulatory Visit: Payer: Self-pay

## 2021-11-01 ENCOUNTER — Inpatient Hospital Stay (HOSPITAL_BASED_OUTPATIENT_CLINIC_OR_DEPARTMENT_OTHER): Payer: 59 | Admitting: Gynecologic Oncology

## 2021-11-01 VITALS — BP 142/70 | HR 87 | Temp 98.5°F | Resp 18 | Ht 64.0 in | Wt 216.0 lb

## 2021-11-01 DIAGNOSIS — C541 Malignant neoplasm of endometrium: Secondary | ICD-10-CM

## 2021-11-01 DIAGNOSIS — Z90722 Acquired absence of ovaries, bilateral: Secondary | ICD-10-CM

## 2021-11-01 DIAGNOSIS — Z7189 Other specified counseling: Secondary | ICD-10-CM

## 2021-11-01 DIAGNOSIS — Z90710 Acquired absence of both cervix and uterus: Secondary | ICD-10-CM

## 2021-11-01 NOTE — Progress Notes (Signed)
Gynecologic Oncology Return Clinic Visit  11/01/2021  Reason for Visit: Postoperative follow-up, treatment planning  Treatment History: Oncology History Overview Note  MSI pending  MMR IHC MLH1:          LOSS OF NUCLEAR EXPRESSION  MSH2:          Preserved nuclear expression  MSH6:          Preserved nuclear expression  PMS2:          LOSS OF NUCLEAR EXPRESSION   Endometrial cancer ([place])  09/30/2021 Initial Biopsy   Hysteroscopy D&C, and MyoSure resection of anterior wall endometrial polyp (noted to be incomplete).  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   10/04/2021 Initial Diagnosis   Endometrial cancer ([place])   10/09/2021 Imaging   CT A/P: IMPRESSION: Endometrial thickening measuring 10 mm, consistent with known history of endometrial carcinoma.   2 cm subserosal fibroid in anterior uterine fundus.   No evidence of metastatic disease within the abdomen or pelvis.   Colonic diverticulosis, without radiographic evidence of diverticulitis.   Mild hepatic steatosis.   Aortic Atherosclerosis   10/10/2021 Surgery   TRH/BSO, SLN biopsy, cysto, repair vaginal laceration  Findings: On EUA, small mobile uterus. On intra-abdminal entry, some adhesions of omentum to the anterior abdominal wall in the mid right abdomen, filmy.  Normal liver, diaphragm, stomach.  Normal small and large bowel.  Uterus approximately 8 cm in size with a 1-2 cm fundal left fibroid.  Ovaries unremarkable although larger than would expect in a postmenopausal patient.  Normal-appearing fallopian tubes although disrupted.  Clip within the pelvis noted on bladder peritoneum.  Mapping successful bilaterally to external iliac sentinel lymph nodes.  No obvious evidence of extrauterine disease.  Right ovary adherent to the posterior uterus and broad ligament, rectum somewhat adherent posteriorly within the cul-de-sac.  Area of questionable deserosalization of the rectum was  oversewn, negative bubble test.  Given significant adhesions of the bladder to the cervix, cystoscopy was performed, bladder and dome intact, good efflux noted from bilateral ureteral orifices.   10/10/2021 Pathology Results   A. SENTINEL LYMPH NODE, RIGHT EXTERNAL ILIAC, BIOPSY:  -  No carcinoma identified in one lymph node (0/1)  -  See comment   B. SENTINEL LYMPH NODE, LEFT EXTERNAL ILIAC, BIOPSY:  -  No carcinoma identified in one lymph node (0/1)  -  See comment   C. UTERUS, CERVIX, BILATERAL TUBES AND OVARIES:  Uterus:  -  Endometrioid carcinoma, FIGO grade 2, largely involving an  endometrial polyp  -  Benign endometrial polyp  -  Leiomyomata (2.2 cm; largest)  -  Adenomyosis  -  See oncology table and comment below   Cervix:  -  No carcinoma identified   Bilateral Ovaries:  -  No carcinoma identified   Bilateral Fallopian tubes:  -  No carcinoma identified   ONCOLOGY TABLE:   UTERUS, CARCINOMA OR CARCINOSARCOMA: Resection   Procedure: Total hysterectomy and bilateral salpingo-oophorectomy  Histologic Type: Endometrioid carcinoma, NOS  Histologic Grade: FIGO grade 2  Myometrial Invasion:       Depth of Myometrial Invasion (mm): 2       Myometrial Thickness (mm): 22       Percentage of Myometrial Invasion: Less than 50%  Uterine Serosa Involvement: Not identified  Cervical stromal Involvement: Not identified  Extent of involvement of other tissue/organs: Not identified  Peritoneal/Ascitic Fluid: Not submitted unknown  Lymphovascular Invasion: Not identified  Regional Lymph Nodes:  Pelvic Lymph Nodes Examined:  2 Sentinel                                0 non-sentinel                                   2 total       Pelvic Lymph Nodes with Metastasis: 0                           Macrometastasis: (>2.0 mm): 0                          Micrometastasis: (>0.2 mm and < 2.0 mm): 0                            Isolated Tumor Cells (<0.2 mm): 0                        Laterality of Lymph Node with Tumor: N/A                             Extracapsular Extension: N/A       Para-aortic Lymph Nodes Examined: N/A  Distant Metastasis:       Distant Site(s) Involved: N/A  Pathologic Stage Classification (pTNM, AJCC 8th Edition): pT1a, pN0  Ancillary Studies: MMR / MSI testing will be ordered  Representative Tumor Block: C6  Comment(s): See above     Interval History: Patient reports overall doing well.  Notes continued improvements daily from a postoperative standpoint.  Has no significant abdominal or pelvic pain.  Denies any vaginal bleeding or discharge.  Denies any urinary symptoms.  Is on Ozempic, so has changes to her bowel function due to this.  Bowel function is more or less back to baseline.  Past Medical/Surgical History: Past Medical History:  Diagnosis Date   Arthritis    both knees   CKD (chronic kidney disease), stage III ([place])    no nephrologist   Diabetes mellitus without complication (HCC)    DM2, injection once a week; cbgs normally 110-130   Essential hypertension    Gout    Mixed hyperlipidemia    Morbid obesity ([place])    Pure hypercholesterolemia    uterine ca 09/2021   Vitamin D deficiency    Wears glasses     Past Surgical History:  Procedure Laterality Date   [place] N/A 09/30/2021   Procedure: DILATATION & CURETTAGE/HYSTEROSCOPY WITH MYOSURE;  Surgeon: Brien Few, MD;  Location: [facility];  Service: Gynecology;  Laterality: N/A;   EYE SURGERY     cataract surgery, lens implant on right eye   LASIK Bilateral 2002   LYMPH NODE DISSECTION N/A 10/10/2021   Procedure: LYMPH NODE DISSECTION;  Surgeon: [name], MD;  Location: [facility];  Service: Gynecology;  Laterality: N/A;   ROBOTIC ASSISTED TOTAL HYSTERECTOMY WITH BILATERAL SALPINGO OOPHERECTOMY Bilateral 10/10/2021   Procedure: XI ROBOTIC ASSISTED TOTAL HYSTERECTOMY WITH  BILATERAL SALPINGO OOPHORECTOMY;CYSTOSCOPY;  Surgeon: [name], MD;  Location: [facility];  Service: Gynecology;  Laterality: Bilateral;   SENTINEL NODE BIOPSY N/A 10/10/2021  Procedure: SENTINEL NODE BIOPSY;  Surgeon: [name], MD;  Location: [facility];  Service: Gynecology;  Laterality: N/A;   TONSILLECTOMY  1953    Family History  Problem Relation Age of Onset   Breast cancer Paternal Aunt    Uterine cancer Paternal Aunt    Lung cancer Maternal Uncle    Colon cancer Neg Hx    Ovarian cancer Neg Hx    Endometrial cancer Neg Hx    Pancreatic cancer Neg Hx    Prostate cancer Neg Hx     Social History   Socioeconomic History   Marital status: Single    Spouse name: Not on file   Number of children: Not on file   Years of education: Not on file   Highest education level: Not on file  Occupational History   Not on file  Tobacco Use   Smoking status: Never   Smokeless tobacco: Never  Vaping Use   Vaping Use: Never used  Substance and Sexual Activity   Alcohol use: Never   Drug use: Never   Sexual activity: Not Currently  Other Topics Concern   Not on file  Social History Narrative   Not on file   Social Determinants of Health   Financial Resource Strain: Not on file  Food Insecurity: Not on file  Transportation Needs: Not on file  Physical Activity: Not on file  Stress: Not on file  Social Connections: Not on file    Current Medications:  Current Outpatient Medications:    acetaminophen (TYLENOL) 325 MG tablet, Take 650 mg by mouth every 6 (six) hours as needed for mild pain., Disp: , Rfl:    amLODipine (NORVASC) 10 MG tablet, Take 10 mg by mouth at bedtime., Disp: , Rfl:    aspirin 81 MG chewable tablet, Chew 81 mg by mouth daily., Disp: , Rfl:    calcium carbonate (OSCAL) 1500 (600 Ca) MG TABS tablet, Take 1,200 mg by mouth daily., Disp: , Rfl:    Cholecalciferol (VITAMIN D3) 125 MCG (5000 UT) TABS, Take 5,000 Units by mouth daily., Disp: ,  Rfl:    diclofenac Sodium (VOLTAREN) 1 % GEL, Apply 2 g topically daily as needed (Arthritis). For knees, Disp: , Rfl:    dimenhyDRINATE (DRAMAMINE) 50 MG tablet, Take 50 mg by mouth every 8 (eight) hours as needed. PRN due to vertigo, Disp: , Rfl:    loratadine (CLARITIN) 10 MG tablet, Take 10 mg by mouth daily as needed for allergies or rhinitis., Disp: , Rfl:    Menthol, Topical Analgesic, (BIOFREEZE ROLL-ON EX), Apply 1 application topically daily as needed (Back pain)., Disp: , Rfl:    naphazoline-pheniramine (VISINE) 0.025-0.3 % ophthalmic solution, 1 drop daily as needed (Dry eye)., Disp: , Rfl:    rosuvastatin (CRESTOR) 5 MG tablet, Take 5 mg by mouth at bedtime., Disp: , Rfl:    Semaglutide, 1 MG/DOSE, (OZEMPIC, 1 MG/DOSE,) 2 MG/1.5ML SOPN, Inject 1 mg into the skin once a week. Injection on Sundays., Disp: , Rfl:    sodium chloride (OCEAN) 0.65 % SOLN nasal spray, Place 1 spray into both nostrils as needed for congestion., Disp: , Rfl:    telmisartan (MICARDIS) 40 MG tablet, Take 40 mg by mouth daily., Disp: , Rfl:    UNABLE TO FIND, Apply 1 application topically daily as needed (Leg cramps). Hylands OTC for leg cramps, Disp: , Rfl:    zinc gluconate 50 MG tablet, Take 25 mg by mouth daily., Disp: , Rfl:  BINAXNOW COVID-19 AG HOME TEST KIT, , Disp: , Rfl:    hydrochlorothiazide (MICROZIDE) 12.5 MG capsule, Take 12.5 mg by mouth daily. (Patient not taking: Reported on 10/28/2021), Disp: , Rfl:    potassium chloride (KLOR-CON M) 10 MEQ tablet, Take 1 tablet (10 mEq total) by mouth 2 (two) times daily for 3 days., Disp: 6 tablet, Rfl: 0   potassium chloride SA (KLOR-CON M) 20 MEQ tablet, Take 1 tablet (20 mEq total) by mouth 2 (two) times daily. (Patient not taking: Reported on 10/28/2021), Disp: 2 tablet, Rfl: 0   senna-docusate (SENOKOT-S) 8.6-50 MG tablet, Take 2 tablets by mouth at bedtime. For AFTER surgery, do not take if having diarrhea (Patient not taking: Reported on 10/28/2021), Disp:  30 tablet, Rfl: 0   traMADol (ULTRAM) 50 MG tablet, Take 1 tablet (50 mg total) by mouth every 6 (six) hours as needed. (Patient not taking: Reported on 10/28/2021), Disp: 20 tablet, Rfl: 0  Review of Systems: Denies appetite changes, fevers, chills, fatigue, unexplained weight changes. Denies hearing loss, neck lumps or masses, mouth sores, ringing in ears or voice changes. Denies cough or wheezing.  Denies shortness of breath. Denies chest pain or palpitations. Denies leg swelling. Denies abdominal distention, pain, blood in stools, constipation, diarrhea, nausea, vomiting, or early satiety. Denies pain with intercourse, dysuria, frequency, hematuria or incontinence. Denies hot flashes, pelvic pain, vaginal bleeding or vaginal discharge.   Denies joint pain, back pain or muscle pain/cramps. Denies itching, rash, or wounds. Denies dizziness, headaches, numbness or seizures. Denies swollen lymph nodes or glands, denies easy bruising or bleeding. Denies anxiety, depression, confusion, or decreased concentration.  Physical Exam: BP (!) 142/70 (BP Location: Left Arm, Patient Position: Sitting)    Pulse 87    Temp 98.5 F (36.9 C) (Tympanic)    Resp 18    Ht 5' 4"  (1.626 m)    Wt 216 lb (98 kg)    SpO2 97%    BMI 37.08 kg/m  General: Alert, oriented, no acute distress. HEENT: Normocephalic, atraumatic, sclera anicteric. Chest: Unlabored breathing on room air. Abdomen: Obese, soft, nontender.  Normoactive bowel sounds.  No masses or hepatosplenomegaly appreciated.  Well-healed incisions. Extremities: Grossly normal range of motion.  Warm, well perfused.  No edema bilaterally. Skin: No rashes or lesions noted. Lymphatics: No cervical, supraclavicular, or inguinal adenopathy. GU: Normal appearing external genitalia without erythema, excoriation, or lesions.  Speculum exam reveals cuff intact, no bleeding or discharge, suture visible.  Bimanual exam reveals cuff intact, no fluctuance or tenderness  to palpation.    Laboratory & Radiologic Studies: None new  Assessment & Plan: REESHA DEBES is a 71 y.o. 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  Patient is overall doing well from a postoperative standpoint.  Discussed continued expectations as well as restrictions.  Reviewed MMR and MSI testing.  Hyper methylation testing is pending, I will notify the patient when these results are back.  We discussed her pathology report in detail again.  She was given a paper copy for her records.  Given that she meets high-intermediate risk criteria, we had previously discussed the use of adjuvant vaginal brachytherapy to decrease her risk of vaginal cuff recurrence.  She has met with Dr. Sondra Come.  I will have Santiago Glad work to coordinate getting her scheduled for treatment between 6 and 8 weeks after surgery.  We reviewed signs and symptoms that would be concerning for disease recurrence, and I stressed  the importance of calling if she develops any of these between visits.  I will tentatively schedule her to see me back in June.  28 minutes of total time was spent for this patient encounter, including preparation, face-to-face counseling with the patient and coordination of care, and documentation of the encounter.  Jeral Pinch, MD  Division of Gynecologic Oncology  Department of Obstetrics and Gynecology  [facility]

## 2021-11-01 NOTE — Patient Instructions (Signed)
It was good to see you today.  You are healing well from surgery.  Remember, no heavy lifting, pushing, or pulling until least 6 weeks after surgery.  Nothing in the vagina for at least 8 weeks.  We discussed signs and symptoms that would be concerning for cancer recurrence today.  Please call if you develop any of those between visits.  I have asked Santiago Glad to work on getting you scheduled for starting the vaginal radiation likely sometime between 6 and 8 weeks after surgery.  We will tentatively schedule follow-up with me about 3 months after you finish treatment in June.

## 2021-11-04 ENCOUNTER — Encounter (HOSPITAL_COMMUNITY): Payer: Self-pay | Admitting: Gynecologic Oncology

## 2021-11-08 ENCOUNTER — Telehealth: Payer: Self-pay | Admitting: Oncology

## 2021-11-08 NOTE — Telephone Encounter (Signed)
Called Becky Dudley and advised that the testing on her pathology (hypermethyalation present)  is most consistent with her cancer being a sporadic cancer.  She verbalized understanding and agreement.

## 2021-12-02 ENCOUNTER — Telehealth: Payer: Self-pay | Admitting: *Deleted

## 2021-12-02 NOTE — Progress Notes (Signed)
Radiation Oncology         [phone] ________________________________  Name: Becky Dudley MRN: [id number]  Date: 12/03/2021  DOB: August 15, 1951  Vaginal Brachytherapy Procedure Note  CC: [name], MD [name], MD    ICD-10-CM   1. Endometrial cancer (HCC)  C54.1       Diagnosis: FIGO grade 2 endometrioid carcinoma; s/p hysterectomy and BSO, Stage pT1a, pN0    Narrative: She returns today for vaginal cylinder fitting.  She has been doing well since her initial consultation.  She did have 1 episode of the very mild vaginal bleeding but none since.  She denies any vaginal discharge or urinary symptoms.  She denies any bowel issues.  Since her initial consultation date, the patient met with Dr. Berline Lopes on 11/01/21. During which time, the patient was noted to be doing well from a postoperative standpoint. Pelvic exam performed during this visit revealed normal findings overall. Otherwise, no significant interval history since the patient was seen for her initial consultation on 10/28/21.   ALLERGIES: is allergic to lisinopril.  Meds: Current Outpatient Medications  Medication Sig Dispense Refill   acetaminophen (TYLENOL) 325 MG tablet Take 650 mg by mouth every 6 (six) hours as needed for mild pain.     amLODipine (NORVASC) 10 MG tablet Take 10 mg by mouth at bedtime.     aspirin 81 MG chewable tablet Chew 81 mg by mouth daily.     calcium carbonate (OSCAL) 1500 (600 Ca) MG TABS tablet Take 1,200 mg by mouth daily.     Cholecalciferol (VITAMIN D3) 125 MCG (5000 UT) TABS Take 5,000 Units by mouth daily.     diclofenac Sodium (VOLTAREN) 1 % GEL Apply 2 g topically daily as needed (Arthritis). For knees     dimenhyDRINATE (DRAMAMINE) 50 MG tablet Take 50 mg by mouth every 8 (eight) hours as needed. PRN due to vertigo     loratadine (CLARITIN) 10 MG tablet Take 10 mg by mouth daily as needed for allergies or rhinitis.     Menthol, Topical Analgesic,  (BIOFREEZE ROLL-ON EX) Apply 1 application topically daily as needed (Back pain).     naphazoline-pheniramine (VISINE) 0.025-0.3 % ophthalmic solution 1 drop daily as needed (Dry eye).     rosuvastatin (CRESTOR) 5 MG tablet Take 5 mg by mouth at bedtime.     Semaglutide, 1 MG/DOSE, (OZEMPIC, 1 MG/DOSE,) 2 MG/1.5ML SOPN Inject 1 mg into the skin once a week. Injection on Sundays.     telmisartan (MICARDIS) 40 MG tablet Take 40 mg by mouth daily.     UNABLE TO FIND Apply 1 application topically daily as needed (Leg cramps). [facility]ands OTC for leg cramps     zinc gluconate 50 MG tablet Take 25 mg by mouth daily.     hydrochlorothiazide (MICROZIDE) 12.5 MG capsule Take 12.5 mg by mouth daily. (Patient not taking: Reported on 10/28/2021)     potassium chloride (KLOR-CON M) 10 MEQ tablet Take 1 tablet (10 mEq total) by mouth 2 (two) times daily for 3 days. 6 tablet 0   potassium chloride SA (KLOR-CON M) 20 MEQ tablet Take 1 tablet (20 mEq total) by mouth 2 (two) times daily. (Patient not taking: Reported on 10/28/2021) 2 tablet 0   senna-docusate (SENOKOT-S) 8.6-50 MG tablet Take 2 tablets by mouth at bedtime. For AFTER surgery, do not take if having diarrhea (Patient not taking: Reported on 10/28/2021) 30 tablet 0   sodium chloride (OCEAN) 0.65 % SOLN nasal  spray Place 1 spray into both nostrils as needed for congestion. (Patient not taking: Reported on 12/03/2021)     traMADol (ULTRAM) 50 MG tablet Take 1 tablet (50 mg total) by mouth every 6 (six) hours as needed. (Patient not taking: Reported on 10/28/2021) 20 tablet 0   No current facility-administered medications for this encounter.    Physical Findings: The patient is in no acute distress. Patient is alert and oriented.  height is 5' 4"  (1.626 m) and weight is 217 lb 6.4 oz (98.6 kg). Her temperature is 97.6 F (36.4 C). Her blood pressure is 104/83 and her pulse is 77. Her respiration is 20 and oxygen saturation is 98%.   No palpable cervical,  supraclavicular or axillary lymphoadenopathy. The heart has a regular rate and rhythm. The lungs are clear to auscultation. Abdomen soft and non-tender.  On pelvic examination the external genitalia were unremarkable. A speculum exam was performed.  A slight amount of blood-tinged fluid was present at the region of the vaginal cuff.  No further fluid noted after placement of [place]x swab. vaginal cuff intact, no mucosal erythema. On bimanual exam there were no pelvic masses appreciated.      Lab Findings: Lab Results  Component Value Date   WBC 7.6 10/09/2021   HGB 13.3 10/09/2021   HCT 40.0 10/09/2021   MCV 91.3 10/09/2021   PLT 275 10/09/2021    Radiographic Findings: No results found.  Impression: FIGO grade 2 endometrioid carcinoma; s/p hysterectomy and BSO, Stage pT1a, pN0  Patient was fitted for a vaginal cylinder. The patient will be treated with a 3.0 cm diameter cylinder with a treatment length of 3.5 cm. This distended the vaginal vault without undue discomfort. The patient tolerated the procedure well.  The patient was successfully fitted for a vaginal cylinder. The patient is appropriate to begin vaginal brachytherapy.   Plan: The patient will proceed with CT simulation and vaginal brachytherapy today.    _______________________________   Blair Promise, PhD, MD  This document serves as a record of services personally performed by Gery Pray, MD. It was created on his behalf by Roney Mans, a trained medical scribe. The creation of this record is based on the scribe's personal observations and the provider's statements to them. This document has been checked and approved by the attending provider.

## 2021-12-02 NOTE — Telephone Encounter (Signed)
CALLED PATIENT TO INFORM OF NEW HDR [facility] FOR 12-03-21, SPOKE WITH PATIENT AND SHE IS AWARE OF THESE APPTS.

## 2021-12-02 NOTE — Progress Notes (Signed)
°  Radiation Oncology         [phone] ________________________________  Name: ALENI ANDRUS MRN: [id number]  Date: 12/03/2021  DOB: [birth year]  CC: [name], MD  [name], MD  HDR BRACHYTHERAPY NOTE  DIAGNOSIS: FIGO grade 2 endometrioid carcinoma; s/p hysterectomy and BSO, Stage pT1a, pN0   Simple treatment device note: Patient had construction of her custom vaginal cylinder. She will be treated with a 3.0 cm diameter segmented cylinder. This conforms to her anatomy without undue discomfort.  Vaginal brachytherapy procedure node: The patient was brought to the [place] suite. Identity was confirmed. All relevant records and images related to the planned course of therapy were reviewed. The patient freely provided informed written consent to proceed with treatment after reviewing the details related to the planned course of therapy. The consent form was witnessed and verified by the simulation staff. Then, the patient was set-up in a stable reproducible supine position for radiation therapy. Pelvic exam revealed the vaginal cuff to be intact . The patient's custom vaginal cylinder was placed in the proximal vagina. This was affixed to the CT/MR stabilization plate to prevent slippage. Patient tolerated the placement well.  Verification simulation note:  A fiducial marker was placed within the vaginal cylinder. An AP and lateral film was then obtained through the pelvis area. This documented accurate position of the vaginal cylinder for treatment.  HDR BRACHYTHERAPY TREATMENT  The remote afterloading device was affixed to the vaginal cylinder by catheter. Patient then proceeded to undergo her first high-dose-rate treatment directed at the proximal vagina. The patient was prescribed a dose of 6.0 gray to be delivered to the mucosal surface. Treatment length was 3.5 cm. Patient was treated with 1 channel using 8 dwell positions. Treatment time was 254.2 seconds. Iridium  192 was the high-dose-rate source for treatment. The patient tolerated the treatment well. After completion of her therapy, a radiation survey was performed documenting return of the iridium source into the [employer] safe.   PLAN: The patient will return next week for her second high-dose-rate treatment.  ________________________________  -----------------------------------  Blair Promise, PhD, MD  This document serves as a record of services personally performed by Gery Pray, MD. It was created on his behalf by Roney Mans, a trained medical scribe. The creation of this record is based on the scribe's personal observations and the provider's statements to them. This document has been checked and approved by the attending provider.

## 2021-12-03 ENCOUNTER — Ambulatory Visit
Admission: RE | Admit: 2021-12-03 | Discharge: 2021-12-03 | Disposition: A | Discharge status: Home / Self Care | Payer: 59 | Source: Ambulatory Visit | Attending: Radiation Oncology | Admitting: Radiation Oncology

## 2021-12-03 ENCOUNTER — Other Ambulatory Visit: Payer: Self-pay

## 2021-12-03 ENCOUNTER — Encounter: Payer: Self-pay | Admitting: Radiation Oncology

## 2021-12-03 VITALS — BP 104/83 | HR 77 | Temp 97.6°F | Resp 20 | Ht 64.0 in | Wt 217.4 lb

## 2021-12-03 DIAGNOSIS — Z90710 Acquired absence of both cervix and uterus: Secondary | ICD-10-CM | POA: Insufficient documentation

## 2021-12-03 DIAGNOSIS — C541 Malignant neoplasm of endometrium: Secondary | ICD-10-CM | POA: Insufficient documentation

## 2021-12-03 DIAGNOSIS — Z90722 Acquired absence of ovaries, bilateral: Secondary | ICD-10-CM | POA: Insufficient documentation

## 2021-12-03 DIAGNOSIS — Z79899 Other long term (current) drug therapy: Secondary | ICD-10-CM | POA: Diagnosis not present

## 2021-12-03 DIAGNOSIS — Z7982 Long term (current) use of aspirin: Secondary | ICD-10-CM | POA: Diagnosis not present

## 2021-12-03 NOTE — Progress Notes (Addendum)
Becky Dudley is here today for HDR [facility] fitting and treatment.  They did not have EBRT.  Does the patient complain of any of the following:  Pain:denies Abdominal bloating: denies Diarrhea/Constipation: occasional loose stool, not liquid Nausea/Vomiting: denies Vaginal Discharge: denies Blood in Urine or Stool: denies Urinary Issues (dysuria/incomplete emptying/ incontinence/ increased frequency/urgency/nocturia): nocturia x 0-1 Post radiation skin changes: n/a   Additional comments if applicable: Appetite is fair. Occasional "twinge" in the abdomen but denies pain.  Vitals:   12/03/21 0832  BP: 104/83  Pulse: 77  Resp: 20  Temp: 97.6 F (36.4 C)  SpO2: 98%  Weight: 217 lb 6.4 oz (98.6 kg)  Height: 5\' 4"  (1.626 m)

## 2021-12-03 NOTE — Progress Notes (Signed)
See MD note for nursing evaluation. °

## 2021-12-10 ENCOUNTER — Telehealth: Payer: Self-pay | Admitting: *Deleted

## 2021-12-10 NOTE — Progress Notes (Signed)
?  Radiation Oncology         [phone] ?________________________________ ? ?Name: Becky Dudley MRN: [id number]  ?Date: 12/11/2021  DOB: 1951/05/22 ? ?CC: [name], MD  [name], MD ? ?HDR BRACHYTHERAPY NOTE ? ?DIAGNOSIS: FIGO grade 2 endometrioid carcinoma; s/p hysterectomy and BSO, Stage pT1a, pN0 ?  ?Simple treatment device note: ?Patient had construction of her custom vaginal cylinder. She will be treated with a 3.0 cm diameter segmented cylinder. This conforms to her anatomy without undue discomfort. ? ?Vaginal brachytherapy procedure node: ?The patient was brought to the [place] suite. Identity was confirmed. All relevant records and images related to the planned course of therapy were reviewed. The patient freely provided informed written consent to proceed with treatment after reviewing the details related to the planned course of therapy. The consent form was witnessed and verified by the simulation staff. Then, the patient was set-up in a stable reproducible supine position for radiation therapy. Pelvic exam revealed the vaginal cuff to be intact . The patient's custom vaginal cylinder was placed in the proximal vagina. This was affixed to the CT/MR stabilization plate to prevent slippage. Patient tolerated the placement well. ? ?Verification simulation note:  ?A fiducial marker was placed within the vaginal cylinder. An AP and lateral film was then obtained through the pelvis area. This documented accurate position of the vaginal cylinder for treatment. ? ?HDR BRACHYTHERAPY TREATMENT  ?The remote afterloading device was affixed to the vaginal cylinder by catheter. Patient then proceeded to undergo her second high-dose-rate treatment directed at the proximal vagina. The patient was prescribed a dose of 6.0 gray to be delivered to the mucosal surface. Treatment length was 3.5 cm. Patient was treated with 1 channel using 8 dwell positions. Treatment time was 274.1 seconds. Iridium  192 was the high-dose-rate source for treatment. The patient tolerated the treatment well. After completion of her therapy, a radiation survey was performed documenting return of the iridium source into the GammaMed safe. ?  ?PLAN: The patient will return next week for her third high-dose-rate treatment.  ?________________________________  ?----------------------------------- ? ?Blair Promise, PhD, MD ? ?This document serves as a record of services personally performed by Gery Pray, MD. It was created on his behalf by Roney Mans, a trained medical scribe. The creation of this record is based on the scribe's personal observations and the provider's statements to them. This document has been checked and approved by the attending provider. ?  ? ?

## 2021-12-10 NOTE — Telephone Encounter (Signed)
CALLED PATIENT TO REMIND OF HDR [place] 12-11-21 @ 2 PM, SPOKE WITH PATIENT AND SHE IS AWARE OF THIS [place]

## 2021-12-11 ENCOUNTER — Ambulatory Visit
Admission: RE | Admit: 2021-12-11 | Discharge: 2021-12-11 | Disposition: A | Discharge status: Home / Self Care | Payer: 59 | Source: Ambulatory Visit | Attending: Radiation Oncology | Admitting: Radiation Oncology

## 2021-12-11 ENCOUNTER — Other Ambulatory Visit: Payer: Self-pay

## 2021-12-11 DIAGNOSIS — C541 Malignant neoplasm of endometrium: Secondary | ICD-10-CM | POA: Insufficient documentation

## 2021-12-17 ENCOUNTER — Telehealth: Payer: Self-pay | Admitting: *Deleted

## 2021-12-17 NOTE — Progress Notes (Signed)
?  Radiation Oncology         [phone]) [phone] ?________________________________ ? ?Name: JAMEKA IVIE MRN: [id number]  ?Date: 12/18/2021  DOB: [birth year] ? ?CC: [name], MD  [name], MD ? ?HDR BRACHYTHERAPY NOTE ? ?DIAGNOSIS: FIGO grade 2 endometrioid carcinoma; s/p hysterectomy and BSO, Stage pT1a, pN0 ?  ?Simple treatment device note: ?Patient had construction of her custom vaginal cylinder. She will be treated with a 3.0 cm diameter segmented cylinder. This conforms to her anatomy without undue discomfort. ? ?Vaginal brachytherapy procedure node: ?The patient was brought to the [place] suite. Identity was confirmed. All relevant records and images related to the planned course of therapy were reviewed. The patient freely provided informed written consent to proceed with treatment after reviewing the details related to the planned course of therapy. The consent form was witnessed and verified by the simulation staff. Then, the patient was set-up in a stable reproducible supine position for radiation therapy. Pelvic exam revealed the vaginal cuff to be intact . The patient's custom vaginal cylinder was placed in the proximal vagina. This was affixed to the CT/MR stabilization plate to prevent slippage. Patient tolerated the placement well. ? ?Verification simulation note:  ?A fiducial marker was placed within the vaginal cylinder. An AP and lateral film was then obtained through the pelvis area. This documented accurate position of the vaginal cylinder for treatment. ? ?HDR BRACHYTHERAPY TREATMENT  ?The remote afterloading device was affixed to the vaginal cylinder by catheter. Patient then proceeded to undergo her third high-dose-rate treatment directed at the proximal vagina. The patient was prescribed a dose of 6.0 gray to be delivered to the mucosal surface. Treatment length was 3.5 cm. Patient was treated with 1 channel using 8 dwell positions. Treatment time was 292.7 seconds. Iridium  192 was the high-dose-rate source for treatment. The patient tolerated the treatment well. After completion of her therapy, a radiation survey was performed documenting return of the iridium source into the GammaMed safe. ?  ?PLAN: The patient will return next week for her fourth high-dose-rate treatment.  ?________________________________  ?----------------------------------- ? ?Blair Promise, PhD, MD ? ?This document serves as a record of services personally performed by Gery Pray, MD. It was created on his behalf by Roney Mans, a trained medical scribe. The creation of this record is based on the scribe's personal observations and the provider's statements to them. This document has been checked and approved by the attending provider. ?  ? ?

## 2021-12-17 NOTE — Telephone Encounter (Signed)
CALLED PATIENT TO REMIND OF HDR [place] 12-18-21 @ 9 AM, SPOKE WITH PATIENT AND SHE IS AWARE OF THIS [place]. ?

## 2021-12-18 ENCOUNTER — Other Ambulatory Visit: Payer: Self-pay

## 2021-12-18 ENCOUNTER — Ambulatory Visit
Admission: RE | Admit: 2021-12-18 | Discharge: 2021-12-18 | Disposition: A | Discharge status: Home / Self Care | Payer: 59 | Source: Ambulatory Visit | Attending: Radiation Oncology | Admitting: Radiation Oncology

## 2021-12-18 DIAGNOSIS — C541 Malignant neoplasm of endometrium: Secondary | ICD-10-CM | POA: Diagnosis not present

## 2021-12-24 ENCOUNTER — Telehealth: Payer: Self-pay | Admitting: *Deleted

## 2021-12-24 NOTE — Telephone Encounter (Signed)
Called patient to remind of HDR Tx. for 12-25-21 @ 2 pm, spoke with patient and she is aware of this treatment  ?

## 2021-12-24 NOTE — Progress Notes (Signed)
?  Radiation Oncology         [phone] ?________________________________ ? ?Name: Becky Dudley MRN: [id number]  ?Date: 12/25/2021  DOB: Mar 17, 1951 ? ?CC: [name], MD  [name], MD ? ?HDR BRACHYTHERAPY NOTE ? ?DIAGNOSIS: FIGO grade 2 endometrioid carcinoma; s/p hysterectomy and BSO, Stage pT1a, pN0 ?  ?Simple treatment device note: ?Patient had construction of her custom vaginal cylinder. She will be treated with a 3.0 cm diameter segmented cylinder. This conforms to her anatomy without undue discomfort. ? ?Vaginal brachytherapy procedure node: ?The patient was brought to the [place] suite. Identity was confirmed. All relevant records and images related to the planned course of therapy were reviewed. The patient freely provided informed written consent to proceed with treatment after reviewing the details related to the planned course of therapy. The consent form was witnessed and verified by the simulation staff. Then, the patient was set-up in a stable reproducible supine position for radiation therapy. Pelvic exam revealed the vaginal cuff to be intact . The patient's custom vaginal cylinder was placed in the proximal vagina. This was affixed to the CT/MR stabilization plate to prevent slippage. Patient tolerated the placement well. ? ?Verification simulation note:  ?A fiducial marker was placed within the vaginal cylinder. An AP and lateral film was then obtained through the pelvis area. This documented accurate position of the vaginal cylinder for treatment. ? ?HDR BRACHYTHERAPY TREATMENT  ?The remote afterloading device was affixed to the vaginal cylinder by catheter. Patient then proceeded to undergo her fourth high-dose-rate treatment directed at the proximal vagina. The patient was prescribed a dose of 6.0 gray to be delivered to the mucosal surface. Treatment length was 3.5 cm. Patient was treated with 1 channel using 8 dwell positions. Treatment time was 312.6 seconds. Iridium  192 was the high-dose-rate source for treatment. The patient tolerated the treatment well. After completion of her therapy, a radiation survey was performed documenting return of the iridium source into the GammaMed safe. ?  ?PLAN: The patient will return next week for her fifth high-dose-rate treatment.  ?________________________________  ?----------------------------------- ? ?Blair Promise, PhD, MD ? ?This document serves as a record of services personally performed by Gery Pray, MD. It was created on his behalf by Roney Mans, a trained medical scribe. The creation of this record is based on the scribe's personal observations and the provider's statements to them. This document has been checked and approved by the attending provider. ?  ? ?

## 2021-12-25 ENCOUNTER — Ambulatory Visit
Admission: RE | Admit: 2021-12-25 | Discharge: 2021-12-25 | Disposition: A | Discharge status: Home / Self Care | Payer: 59 | Source: Ambulatory Visit | Attending: Radiation Oncology | Admitting: Radiation Oncology

## 2021-12-25 ENCOUNTER — Other Ambulatory Visit: Payer: Self-pay

## 2021-12-25 DIAGNOSIS — C541 Malignant neoplasm of endometrium: Secondary | ICD-10-CM

## 2022-01-01 ENCOUNTER — Telehealth: Payer: Self-pay | Admitting: *Deleted

## 2022-01-01 NOTE — Progress Notes (Signed)
?  Radiation Oncology         [phone] ?________________________________ ? ?Name: Becky Dudley MRN: [id number]  ?Date: 01/02/2022  DOB: 1951/02/24 ? ?CC: [name], MD  [name], MD ? ?HDR BRACHYTHERAPY NOTE ? ?DIAGNOSIS: FIGO grade 2 endometrioid carcinoma; s/p hysterectomy and BSO, Stage pT1a, pN0 ?  ?Simple treatment device note: ?Patient had construction of her custom vaginal cylinder. She will be treated with a 3.0 cm diameter segmented cylinder. This conforms to her anatomy without undue discomfort. ? ?Vaginal brachytherapy procedure node: ?The patient was brought to the [place] suite. Identity was confirmed. All relevant records and images related to the planned course of therapy were reviewed. The patient freely provided informed written consent to proceed with treatment after reviewing the details related to the planned course of therapy. The consent form was witnessed and verified by the simulation staff. Then, the patient was set-up in a stable reproducible supine position for radiation therapy. Pelvic exam revealed the vaginal cuff to be intact . The patient's custom vaginal cylinder was placed in the proximal vagina. This was affixed to the CT/MR stabilization plate to prevent slippage. Patient tolerated the placement well. ? ?Verification simulation note:  ?A fiducial marker was placed within the vaginal cylinder. An AP and lateral film was then obtained through the pelvis area. This documented accurate position of the vaginal cylinder for treatment. ? ?HDR BRACHYTHERAPY TREATMENT  ?The remote afterloading device was affixed to the vaginal cylinder by catheter. Patient then proceeded to undergo her fifth high-dose-rate treatment directed at the proximal vagina. The patient was prescribed a dose of 6.0 gray to be delivered to the mucosal surface. Treatment length was 3.5 cm. Patient was treated with 1 channel using 8 dwell positions. Treatment time was 336.9 seconds. Iridium  192 was the high-dose-rate source for treatment. The patient tolerated the treatment well. After completion of her therapy, a radiation survey was performed documenting return of the iridium source into the GammaMed safe. ?  ?PLAN: The patient has completed her high-dose-rate treatments. She will return in one month for routine follow-up.  Overall she tolerated this treatment well without any significant side effects. ? ? ?________________________________  ?----------------------------------- ? ?Blair Promise, PhD, MD ? ?This document serves as a record of services personally performed by Gery Pray, MD. It was created on his behalf by Roney Mans, a trained medical scribe. The creation of this record is based on the scribe's personal observations and the provider's statements to them. This document has been checked and approved by the attending provider. ?  ? ?

## 2022-01-01 NOTE — Telephone Encounter (Signed)
CALLED PATIENT TO REMIND OF HDR [place] 01-02-22 @ 2 PM, SPOKE WITH PATIENT AND SHE IS AWARE OF THIS [place] ?

## 2022-01-02 ENCOUNTER — Ambulatory Visit
Admission: RE | Admit: 2022-01-02 | Discharge: 2022-01-02 | Disposition: A | Discharge status: Home / Self Care | Payer: 59 | Source: Ambulatory Visit | Attending: Radiation Oncology | Admitting: Radiation Oncology

## 2022-01-02 ENCOUNTER — Encounter: Payer: Self-pay | Admitting: Radiation Oncology

## 2022-01-02 ENCOUNTER — Other Ambulatory Visit: Payer: Self-pay

## 2022-01-02 DIAGNOSIS — C541 Malignant neoplasm of endometrium: Secondary | ICD-10-CM

## 2022-01-17 ENCOUNTER — Telehealth: Payer: Self-pay | Admitting: *Deleted

## 2022-01-17 NOTE — Telephone Encounter (Signed)
Request of records faxed ?

## 2022-01-24 ENCOUNTER — Telehealth: Payer: Self-pay | Admitting: *Deleted

## 2022-01-24 NOTE — Telephone Encounter (Signed)
CALLED PATIENT TO ASK ABOUT RESCHEDULING FU ON 02-03-22 DUE TO DR. KINARD BEING ON VACATION, SPOKE WITH PATIENT AND SHE AGREED TO COME ON 02-17-22 @ 11 AM ?

## 2022-01-29 ENCOUNTER — Telehealth: Payer: Self-pay | Admitting: *Deleted

## 2022-01-29 NOTE — Telephone Encounter (Signed)
Returned patient's phone call, spoke with patient 

## 2022-02-03 ENCOUNTER — Ambulatory Visit: Payer: Self-pay | Admitting: Radiation Oncology

## 2022-02-05 ENCOUNTER — Other Ambulatory Visit: Payer: Self-pay | Admitting: Family Medicine

## 2022-02-05 ENCOUNTER — Ambulatory Visit
Admission: RE | Admit: 2022-02-05 | Discharge: 2022-02-05 | Disposition: A | Discharge status: Home / Self Care | Payer: 59 | Source: Ambulatory Visit | Attending: Family Medicine | Admitting: Family Medicine

## 2022-02-05 DIAGNOSIS — R921 Mammographic calcification found on diagnostic imaging of breast: Secondary | ICD-10-CM

## 2022-02-10 ENCOUNTER — Other Ambulatory Visit: Payer: Self-pay | Admitting: Family Medicine

## 2022-02-11 ENCOUNTER — Ambulatory Visit
Admission: RE | Admit: 2022-02-11 | Discharge: 2022-02-11 | Disposition: A | Discharge status: Home / Self Care | Payer: 59 | Source: Ambulatory Visit | Attending: Family Medicine | Admitting: Family Medicine

## 2022-02-11 ENCOUNTER — Other Ambulatory Visit: Payer: Self-pay | Admitting: Family Medicine

## 2022-02-11 DIAGNOSIS — R0789 Other chest pain: Secondary | ICD-10-CM

## 2022-02-17 ENCOUNTER — Ambulatory Visit: Payer: 59 | Admitting: Radiation Oncology

## 2022-02-17 ENCOUNTER — Encounter: Payer: Self-pay | Admitting: Radiation Oncology

## 2022-02-23 NOTE — Progress Notes (Signed)
?Radiation Oncology         [phone] ?________________________________ ? ?Name: Becky Dudley MRN: [id number]  ?Date: 02/24/2022  DOB: 11-May-1951 ? ?Follow-Up Visit Note ? ?CC: [name], MD  [name], MD ? ?  ICD-10-CM   ?1. Endometrial cancer ([place])  C54.1   ?  ? ? ?Diagnosis: FIGO grade 2 endometrioid carcinoma; s/p hysterectomy and BSO, Stage pT1a, pN0 ? ?Interval Since Last Radiation: 1 month and 22 days  ? ?Intent: Curative ? ?Radiation Treatment Dates: 12/03/2021 through 01/02/2022 ?Site Technique Total Dose (Gy) Dose per Fx (Gy) Completed Fx Beam Energies  ?Vagina: Pelvis HDR-brachy 30/30 6 5/5 Ir-192  ? ? ?Narrative:  The patient returns today for routine follow-up. Overall, she tolerated brachytherapy well without any significant side effects.  ? ?Since her initial consultation date, the patient met with Dr. Berline Lopes on 11/01/21. During which time, the patient was noted to be doing well from a postoperative standpoint. Pelvic exam performed during this visit revealed normal findings overall.  ? ?The patient recently has a follow-up right diagnostic mammogram (of previously seen right breast calcifications) on 02/05/22 which showed stable calcifications in the medial right breast. No other suspicious findings were appreciated in the right breast. The patient also had an x-ray of the left ribs and chest performed on 02/11/22 due to chest discomfort which showed mild bibasilar subsegmental atelectasis, and s-shaped thoracic spine scoliosis. No acute bony abnormalities were identified.           ? ?Otherwise, no significant interval history since the patient was last seen.  ? ?She tolerated her vaginal brachytherapy very well.  At this time she denies any vaginal bleeding or discharge.  She occasionally will have some loose stools   And feeling of incomplete bladder emptying at times.  She occasionally will have some abdominal bloating related to certain foods. ? ?Allergies:  is  allergic to lisinopril. ? ?Meds: ?Current Outpatient Medications  ?Medication Sig Dispense Refill  ? acetaminophen (TYLENOL) 325 MG tablet Take 650 mg by mouth every 6 (six) hours as needed for mild pain.    ? amLODipine (NORVASC) 10 MG tablet Take 10 mg by mouth at bedtime.    ? aspirin 81 MG chewable tablet Chew 81 mg by mouth daily.    ? calcium carbonate (OSCAL) 1500 (600 Ca) MG TABS tablet Take 1,200 mg by mouth daily.    ? Cholecalciferol (VITAMIN D3) 125 MCG (5000 UT) TABS Take 5,000 Units by mouth daily.    ? diclofenac Sodium (VOLTAREN) 1 % GEL Apply 2 g topically daily as needed (Arthritis). For knees    ? loratadine (CLARITIN) 10 MG tablet Take 10 mg by mouth daily as needed for allergies or rhinitis.    ? Menthol, Topical Analgesic, (BIOFREEZE ROLL-ON EX) Apply 1 application topically daily as needed (Back pain).    ? naphazoline-pheniramine (VISINE) 0.025-0.3 % ophthalmic solution 1 drop daily as needed (Dry eye).    ? rosuvastatin (CRESTOR) 5 MG tablet Take 5 mg by mouth at bedtime.    ? Semaglutide, 1 MG/DOSE, (OZEMPIC, 1 MG/DOSE,) 2 MG/1.5ML SOPN Inject 1 mg into the skin once a week. Injection on Sundays.    ? sodium chloride (OCEAN) 0.65 % SOLN nasal spray Place 1 spray into both nostrils as needed for congestion.    ? telmisartan (MICARDIS) 40 MG tablet Take 40 mg by mouth daily.    ? UNABLE TO FIND Apply 1 application topically daily as needed (Leg cramps).  [place] OTC for leg cramps    ? zinc gluconate 50 MG tablet Take 25 mg by mouth daily.    ? dimenhyDRINATE (DRAMAMINE) 50 MG tablet Take 50 mg by mouth every 8 (eight) hours as needed. PRN due to vertigo (Patient not taking: Reported on 02/24/2022)    ? hydrochlorothiazide (MICROZIDE) 12.5 MG capsule Take 12.5 mg by mouth daily. (Patient not taking: Reported on 10/28/2021)    ? potassium chloride (KLOR-CON M) 10 MEQ tablet Take 1 tablet (10 mEq total) by mouth 2 (two) times daily for 3 days. 6 tablet 0  ? potassium chloride SA (KLOR-CON M) 20  MEQ tablet Take 1 tablet (20 mEq total) by mouth 2 (two) times daily. (Patient not taking: Reported on 02/24/2022) 2 tablet 0  ? senna-docusate (SENOKOT-S) 8.6-50 MG tablet Take 2 tablets by mouth at bedtime. For AFTER surgery, do not take if having diarrhea (Patient not taking: Reported on 10/28/2021) 30 tablet 0  ? traMADol (ULTRAM) 50 MG tablet Take 1 tablet (50 mg total) by mouth every 6 (six) hours as needed. (Patient not taking: Reported on 10/28/2021) 20 tablet 0  ? ?No current facility-administered medications for this encounter.  ? ? ?Physical Findings: ?The patient is in no acute distress. Patient is alert and oriented. ? height is _0  (1.626 m) and weight is 218 lb 12.8 oz (99.2 kg). Her temperature is 97.6 ?F (36.4 ?C). Her blood pressure is 124/66 and her pulse is 78. Her respiration is 16 and oxygen saturation is 97%. .  No significant changes. Lungs are clear to auscultation bilaterally. Heart has regular rate and rhythm. No palpable cervical, supraclavicular, or axillary adenopathy. Abdomen soft, non-tender, normal bowel sounds.  Pelvic exam deferred in light of recent completion of treatment. ? ? ?Lab Findings: ?Lab Results  ?Component Value Date  ? WBC 7.6 10/09/2021  ? HGB 13.3 10/09/2021  ? HCT 40.0 10/09/2021  ? MCV 91.3 10/09/2021  ? PLT 275 10/09/2021  ? ? ?Radiographic Findings: ?DG Ribs Unilateral W/Chest Left ? ?Result Date: 02/12/2022 ?CLINICAL DATA:  Chest discomfort. EXAM: LEFT RIBS AND CHEST - 3+ VIEW COMPARISON:  Chest x-ray 04/19/2021. FINDINGS: Mediastinum and hilar structures normal. Heart size normal. Mild bibasilar subsegmental atelectasis. No pleural effusion or pneumothorax. S-shaped thoracic spine scoliosis. No acute or focal bony abnormalities. Ribs appear to be intact. IMPRESSION: 1.  Mild bibasilar subsegmental atelectasis. 2. S-shaped thoracic spine scoliosis. No acute bony abnormality is identified. Ribs appear intact. Electronically Signed   By: Marcello Moores  Register M.D.   On:  02/12/2022 10:38  ? ?MM DIAG BREAST TOMO UNI RIGHT ? ?Result Date: 02/05/2022 ?CLINICAL DATA:  Six-month follow-up of right breast calcifications. EXAM: DIGITAL DIAGNOSTIC UNILATERAL RIGHT MAMMOGRAM WITH TOMOSYNTHESIS AND CAD TECHNIQUE: Right digital diagnostic mammography and breast tomosynthesis was performed. The images were evaluated with computer-aided detection. COMPARISON:  Previous exam(s). ACR Breast Density Category c: The breast tissue is heterogeneously dense, which may obscure small masses. FINDINGS: The calcifications identified in the medial right breast are stable. No new or suspicious findings identified in the right breast. IMPRESSION: Stable calcifications in the medial right breast. No other suspicious findings on the right. RECOMMENDATION: Recommend six-month follow-up of the right breast calcifications. The patient will be due for bilateral mammography at that time. I have discussed the findings and recommendations with the patient. If applicable, a reminder letter will be sent to the patient regarding the next appointment. BI-RADS CATEGORY  3: Probably benign. Electronically Signed   By: Shanon Brow  Jimmye Norman III M.D.   On: 02/05/2022 17:59  ? ?Impression: FIGO grade 2 endometrioid carcinoma; s/p hysterectomy and BSO, Stage pT1a, pN0 ? ?She tolerated her vaginal brachytherapy quite well without any significant aftereffects at this time. ? ?Today the patient was given a vaginal dilator and instructions on its use. ? ?Plan: She will follow-up with Dr. Berline Lopes in late June.  Routine follow-up in radiation oncology in late September. ? ? ?15 minutes of total time was spent for this patient encounter, including preparation, face-to-face counseling with the patient and coordination of care, physical exam, and documentation of the encounter. ?____________________________________ ? ?Blair Promise, PhD, MD ? ?This document serves as a record of services personally performed by Gery Pray, MD. It was  created on his behalf by Roney Mans, a trained medical scribe. The creation of this record is based on the scribe's personal observations and the provider's statements to them. This document has been checked a

## 2022-02-23 NOTE — Progress Notes (Incomplete)
?  Radiation Oncology         [phone] ?________________________________ ? ?Patient Name: Becky Dudley ?MRN: [id number] ?DOB: 1951-01-22 ?Referring Physician: Jeral Pinch ?Date of Service: 01/02/2022 ?[facility], [place] ? ?                                                      End Of Treatment Note ? ?Diagnoses: C54.1-Malignant neoplasm of endometrium ? ?Cancer Staging: FIGO grade 2 endometrioid carcinoma; s/p hysterectomy and BSO, Stage pT1a, pN0 ? ?Intent: Curative ? ?Radiation Treatment Dates: 12/03/2021 through 01/02/2022 ?Site Technique Total Dose (Gy) Dose per Fx (Gy) Completed Fx Beam Energies  ?Vagina: Pelvis HDR-brachy 30/30 6 5/5 Ir-192  ? ?Narrative: Overall she tolerated this treatment well without any significant side effects. ? ?Plan: The patient will follow-up with radiation oncology in one month . ? ?________________________________________________ ?----------------------------------- ? ?Blair Promise, PhD, MD ? ?This document serves as a record of services personally performed by Gery Pray, MD. It was created on his behalf by Roney Mans, a trained medical scribe. The creation of this record is based on the scribe's personal observations and the provider's statements to them. This document has been checked and approved by the attending provider. ? ?

## 2022-02-24 ENCOUNTER — Ambulatory Visit
Admission: RE | Admit: 2022-02-24 | Discharge: 2022-02-24 | Disposition: A | Discharge status: Home / Self Care | Payer: 59 | Source: Ambulatory Visit | Attending: Radiation Oncology | Admitting: Radiation Oncology

## 2022-02-24 ENCOUNTER — Encounter: Payer: Self-pay | Admitting: Radiation Oncology

## 2022-02-24 DIAGNOSIS — C541 Malignant neoplasm of endometrium: Secondary | ICD-10-CM | POA: Diagnosis not present

## 2022-02-24 DIAGNOSIS — Z90722 Acquired absence of ovaries, bilateral: Secondary | ICD-10-CM | POA: Diagnosis not present

## 2022-02-24 DIAGNOSIS — Z923 Personal history of irradiation: Secondary | ICD-10-CM | POA: Diagnosis not present

## 2022-02-24 DIAGNOSIS — Z90710 Acquired absence of both cervix and uterus: Secondary | ICD-10-CM | POA: Diagnosis not present

## 2022-02-24 DIAGNOSIS — Z7982 Long term (current) use of aspirin: Secondary | ICD-10-CM | POA: Insufficient documentation

## 2022-02-24 DIAGNOSIS — Z79899 Other long term (current) drug therapy: Secondary | ICD-10-CM | POA: Insufficient documentation

## 2022-02-24 HISTORY — DX: Personal history of irradiation: Z92.3

## 2022-02-24 NOTE — Progress Notes (Signed)
Becky Dudley is here today for follow up post radiation to the pelvic. ? ?They completed their radiation on: 01/02/22  ? ?Does the patient complain of any of the following: ? ?Pain:No  ?Abdominal bloating: Yes, mild ?Diarrhea/Constipation:Loose stools ?Nausea/Vomiting: No ?Vaginal Discharge: NO ?Blood in Urine or Stool: No ?Urinary Issues (dysuria/incomplete emptying/ incontinence/ increased frequency/urgency): Yes incomplete emptying at times.  ?Does patient report using vaginal dilator 2-3 times a week and/or sexually active 2-3 weeks:  Patient given S and S+ vaginal dilators with instructions and importance of use. Patient voiced understanding.  ?Post radiation skin changes: No ? ? ?Additional comments if applicable: ? Patient reports having a tingling sensation to chest area. Patient reports increased fluid retention. Patient has not started back taking diuretic. ? ?BP 124/66   Pulse 78   Temp 97.6 ?F (36.4 ?C)   Resp 16   Ht '5\' 4"'$  (1.626 m)   Wt 218 lb 12.8 oz (99.2 kg)   SpO2 97%   BMI 37.56 kg/m?   ?

## 2022-02-24 NOTE — Patient Instructions (Signed)

## 2022-02-25 ENCOUNTER — Telehealth: Payer: Self-pay | Admitting: *Deleted

## 2022-02-25 ENCOUNTER — Ambulatory Visit
Admission: RE | Admit: 2022-02-25 | Discharge: 2022-02-25 | Disposition: A | Discharge status: Home / Self Care | Payer: 59 | Source: Ambulatory Visit | Attending: Sports Medicine | Admitting: Sports Medicine

## 2022-02-25 ENCOUNTER — Other Ambulatory Visit: Payer: Self-pay | Admitting: Sports Medicine

## 2022-02-25 DIAGNOSIS — M79605 Pain in left leg: Secondary | ICD-10-CM

## 2022-02-25 NOTE — Telephone Encounter (Signed)
CALLED PATIENT TO INFORM OF FU APPT,. WITH DR. KINARD ON 9-25-'23 2 3 '$ PM, LVM FOR A RETURN CALL ?

## 2022-04-01 ENCOUNTER — Encounter: Payer: Self-pay | Admitting: Gynecologic Oncology

## 2022-04-03 ENCOUNTER — Inpatient Hospital Stay: Payer: 59 | Attending: Gynecologic Oncology | Admitting: Gynecologic Oncology

## 2022-04-03 ENCOUNTER — Encounter: Payer: Self-pay | Admitting: Gynecologic Oncology

## 2022-04-03 ENCOUNTER — Other Ambulatory Visit: Payer: Self-pay

## 2022-04-03 VITALS — BP 136/78 | HR 69 | Temp 98.0°F | Resp 16 | Ht 64.0 in | Wt 212.4 lb

## 2022-04-03 DIAGNOSIS — Z08 Encounter for follow-up examination after completed treatment for malignant neoplasm: Secondary | ICD-10-CM | POA: Diagnosis present

## 2022-04-03 DIAGNOSIS — Z90722 Acquired absence of ovaries, bilateral: Secondary | ICD-10-CM | POA: Insufficient documentation

## 2022-04-03 DIAGNOSIS — Z923 Personal history of irradiation: Secondary | ICD-10-CM | POA: Diagnosis not present

## 2022-04-03 DIAGNOSIS — Z90710 Acquired absence of both cervix and uterus: Secondary | ICD-10-CM | POA: Diagnosis not present

## 2022-04-03 DIAGNOSIS — Z8542 Personal history of malignant neoplasm of other parts of uterus: Secondary | ICD-10-CM | POA: Diagnosis present

## 2022-04-03 DIAGNOSIS — C541 Malignant neoplasm of endometrium: Secondary | ICD-10-CM

## 2022-04-03 NOTE — Progress Notes (Signed)
Gynecologic Oncology Return Clinic Visit  04/03/22  Reason for Visit: Surveillance visit after completion of adjuvant treatment in the setting of endometrial cancer  Treatment History: Oncology History Overview Note  MSI- H MLH1 promoter hypermethylation is present  MMR IHC MLH1:          LOSS OF NUCLEAR EXPRESSION  MSH2:          Preserved nuclear expression  MSH6:          Preserved nuclear expression  PMS2:          LOSS OF NUCLEAR EXPRESSION   Endometrial cancer (HCC)  09/30/2021 Initial Biopsy   Hysteroscopy D&C, and MyoSure resection of anterior wall endometrial polyp (noted to be incomplete).  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   10/04/2021 Initial Diagnosis   Endometrial cancer (HCC)   10/09/2021 Imaging   CT A/P: IMPRESSION: Endometrial thickening measuring 10 mm, consistent with known history of endometrial carcinoma.   2 cm subserosal fibroid in anterior uterine fundus.   No evidence of metastatic disease within the abdomen or pelvis.   Colonic diverticulosis, without radiographic evidence of diverticulitis.   Mild hepatic steatosis.   Aortic Atherosclerosis   10/10/2021 Surgery   TRH/BSO, SLN biopsy, cysto, repair vaginal laceration  Findings: On EUA, small mobile uterus. On intra-abdminal entry, some adhesions of omentum to the anterior abdominal wall in the mid right abdomen, filmy.  Normal liver, diaphragm, stomach.  Normal small and large bowel.  Uterus approximately 8 cm in size with a 1-2 cm fundal left fibroid.  Ovaries unremarkable although larger than would expect in a postmenopausal patient.  Normal-appearing fallopian tubes although disrupted.  Clip within the pelvis noted on bladder peritoneum.  Mapping successful bilaterally to external iliac sentinel lymph nodes.  No obvious evidence of extrauterine disease.  Right ovary adherent to the posterior uterus and broad ligament, rectum somewhat adherent  posteriorly within the cul-de-sac.  Area of questionable deserosalization of the rectum was oversewn, negative bubble test.  Given significant adhesions of the bladder to the cervix, cystoscopy was performed, bladder and dome intact, good efflux noted from bilateral ureteral orifices.   10/10/2021 Pathology Results   A. SENTINEL LYMPH NODE, RIGHT EXTERNAL ILIAC, BIOPSY:  -  No carcinoma identified in one lymph node (0/1)  -  See comment   B. SENTINEL LYMPH NODE, LEFT EXTERNAL ILIAC, BIOPSY:  -  No carcinoma identified in one lymph node (0/1)  -  See comment   C. UTERUS, CERVIX, BILATERAL TUBES AND OVARIES:  Uterus:  -  Endometrioid carcinoma, FIGO grade 2, largely involving an  endometrial polyp  -  Benign endometrial polyp  -  Leiomyomata (2.2 cm; largest)  -  Adenomyosis  -  See oncology table and comment below   Cervix:  -  No carcinoma identified   Bilateral Ovaries:  -  No carcinoma identified   Bilateral Fallopian tubes:  -  No carcinoma identified   ONCOLOGY TABLE:   UTERUS, CARCINOMA OR CARCINOSARCOMA: Resection   Procedure: Total hysterectomy and bilateral salpingo-oophorectomy  Histologic Type: Endometrioid carcinoma, NOS  Histologic Grade: FIGO grade 2  Myometrial Invasion:       Depth of Myometrial Invasion (mm): 2       Myometrial Thickness (mm): 22       Percentage of Myometrial Invasion: Less than 50%  Uterine Serosa Involvement: Not identified  Cervical stromal Involvement: Not identified  Extent of involvement of other tissue/organs: Not identified  Peritoneal/Ascitic Fluid: Not  submitted unknown  Lymphovascular Invasion: Not identified  Regional Lymph Nodes:       Pelvic Lymph Nodes Examined:  2 Sentinel                                0 non-sentinel                                   2 total       Pelvic Lymph Nodes with Metastasis: 0                           Macrometastasis: (>2.0 mm): 0                          Micrometastasis: (>0.2 mm and <  2.0 mm): 0                            Isolated Tumor Cells (<0.2 mm): 0                       Laterality of Lymph Node with Tumor: N/A                             Extracapsular Extension: N/A       Para-aortic Lymph Nodes Examined: N/A  Distant Metastasis:       Distant Site(s) Involved: N/A  Pathologic Stage Classification (pTNM, AJCC 8th Edition): pT1a, pN0  Ancillary Studies: MMR / MSI testing will be ordered  Representative Tumor Block: C6  Comment(s): See above   12/03/2021 - 01/02/2022 Radiation Therapy   12/03/2021 through 01/02/2022 Site Technique Total Dose (Gy) Dose per Fx (Gy) Completed Fx Beam Energies  Vagina: Pelvis HDR-brachy 30/30 6 5/5 Ir-192        Interval History: She saw Dr. Roselind Dudley last on 5/15.  Patient reports he is doing well.  Denies any vaginal bleeding or discharge.  Reports regular bowel and bladder function.  Has occasional pain, thinks may be related to her bowels or Ozempic use.  We will have occasional "hot" pain that runs down her leg, goes away very quickly.  Past Medical/Surgical History: Past Medical History:  Diagnosis Date   Arthritis    both knees   CKD (chronic kidney disease), stage III (HCC)    no nephrologist   Diabetes mellitus without complication (HCC)    DM2, injection once a week; cbgs normally 110-130   Essential hypertension    Gout    History of radiation therapy    Endometrium- 12/03/21-01/02/22- Dr. Antony Dudley   Mixed hyperlipidemia    Morbid obesity (HCC)    Pure hypercholesterolemia    uterine ca 09/2021   Vitamin D deficiency    Wears glasses     Past Surgical History:  Procedure Laterality Date   CHOLECYSTECTOMY  1989   DILATATION & CURETTAGE/HYSTEROSCOPY WITH MYOSURE N/A 09/30/2021   Procedure: DILATATION & CURETTAGE/HYSTEROSCOPY WITH MYOSURE;  Surgeon: Becky Mackie, MD;  Location: [facility];  Service: Gynecology;  Laterality: N/A;   EYE SURGERY     cataract surgery, lens implant on right  eye   LASIK Bilateral 2002   LYMPH NODE DISSECTION N/A 10/10/2021   Procedure:  LYMPH NODE DISSECTION;  Surgeon: Becky Fila, MD;  Location: [facility];  Service: Gynecology;  Laterality: N/A;   ROBOTIC ASSISTED TOTAL HYSTERECTOMY WITH BILATERAL SALPINGO OOPHERECTOMY Bilateral 10/10/2021   Procedure: XI ROBOTIC ASSISTED TOTAL HYSTERECTOMY WITH BILATERAL SALPINGO OOPHORECTOMY;CYSTOSCOPY;  Surgeon: Becky Fila, MD;  Location: [facility];  Service: Gynecology;  Laterality: Bilateral;   SENTINEL NODE BIOPSY N/A 10/10/2021   Procedure: SENTINEL NODE BIOPSY;  Surgeon: Becky Fila, MD;  Location: [facility];  Service: Gynecology;  Laterality: N/A;   TONSILLECTOMY  1953    Family History  Problem Relation Age of Onset   Breast cancer Paternal Aunt    Uterine cancer Paternal Aunt    Lung cancer Maternal Uncle    Colon cancer Neg Hx    Ovarian cancer Neg Hx    Endometrial cancer Neg Hx    Pancreatic cancer Neg Hx    Prostate cancer Neg Hx     Social History   Socioeconomic History   Marital status: Single    Spouse name: Not on file   Number of children: Not on file   Years of education: Not on file   Highest education level: Not on file  Occupational History   Not on file  Tobacco Use   Smoking status: Never   Smokeless tobacco: Never  Vaping Use   Vaping Use: Never used  Substance and Sexual Activity   Alcohol use: Never   Drug use: Never   Sexual activity: Not Currently  Other Topics Concern   Not on file  Social History Narrative   Not on file   Social Determinants of Health   Financial Resource Strain: Not on file  Food Insecurity: Not on file  Transportation Needs: Not on file  Physical Activity: Not on file  Stress: Not on file  Social Connections: Not on file    Current Medications:  Current Outpatient Medications:    acetaminophen (TYLENOL) 325 MG tablet, Take 650 mg by mouth every 6 (six) hours as needed for mild pain., Disp: , Rfl:     amLODipine (NORVASC) 10 MG tablet, Take 10 mg by mouth at bedtime., Disp: , Rfl:    aspirin 81 MG chewable tablet, Chew 81 mg by mouth daily., Disp: , Rfl:    calcium carbonate (OSCAL) 1500 (600 Ca) MG TABS tablet, Take 1,200 mg by mouth daily., Disp: , Rfl:    Cholecalciferol (VITAMIN D3) 125 MCG (5000 UT) TABS, Take 5,000 Units by mouth daily., Disp: , Rfl:    diclofenac Sodium (VOLTAREN) 1 % GEL, Apply 2 g topically daily as needed (Arthritis). For knees, Disp: , Rfl:    loratadine (CLARITIN) 10 MG tablet, Take 10 mg by mouth daily as needed for allergies or rhinitis., Disp: , Rfl:    Menthol, Topical Analgesic, (BIOFREEZE ROLL-ON EX), Apply 1 application topically daily as needed (Back pain)., Disp: , Rfl:    naphazoline-pheniramine (VISINE) 0.025-0.3 % ophthalmic solution, 1 drop daily as needed (Dry eye)., Disp: , Rfl:    rosuvastatin (CRESTOR) 5 MG tablet, Take 5 mg by mouth at bedtime., Disp: , Rfl:    Semaglutide, 1 MG/DOSE, (OZEMPIC, 1 MG/DOSE,) 2 MG/1.5ML SOPN, Inject 1 mg into the skin once a week. Injection on Sundays., Disp: , Rfl:    sodium chloride (OCEAN) 0.65 % SOLN nasal spray, Place 1 spray into both nostrils as needed for congestion., Disp: , Rfl:    telmisartan (MICARDIS) 40 MG tablet, Take 40 mg by mouth daily., Disp: , Rfl:  UNABLE TO FIND, Apply 1 application topically daily as needed (Leg cramps). [employer] OTC for leg cramps, Disp: , Rfl:    zinc gluconate 50 MG tablet, Take 25 mg by mouth daily., Disp: , Rfl:   Review of Systems: + fatigue Denies appetite changes, fevers, chills, unexplained weight changes. Denies hearing loss, neck lumps or masses, mouth sores, ringing in ears or voice changes. Denies cough or wheezing.  Denies shortness of breath. Denies chest pain or palpitations. Denies leg swelling. Denies abdominal distention, pain, blood in stools, constipation, diarrhea, nausea, vomiting, or early satiety. Denies pain with intercourse, dysuria, frequency,  hematuria or incontinence. Denies hot flashes, pelvic pain, vaginal bleeding or vaginal discharge.   Denies joint pain, back pain or muscle pain/cramps. Denies itching, rash, or wounds. Denies dizziness, headaches, numbness or seizures. Denies swollen lymph nodes or glands, denies easy bruising or bleeding. Denies anxiety, depression, confusion, or decreased concentration.  Physical Exam: BP 136/78 (BP Location: Left Arm, Patient Position: Sitting)   Pulse 69   Temp 98 F (36.7 C) (Oral)   Resp 16   Ht 5\' 4"  (1.626 m)   Wt 212 lb 6.4 oz (96.3 kg)   SpO2 98%   BMI 36.46 kg/m  General: Alert, oriented, no acute distress. HEENT: Normocephalic, atraumatic, sclera anicteric. Chest: Clear to auscultation bilaterally.  No wheezes or rhonchi. Cardiovascular: Regular rate and rhythm, no murmurs. Abdomen: Obese, soft, nontender.  Normoactive bowel sounds.  No masses or hepatosplenomegaly appreciated.  Well-healed incisions. Extremities: Grossly normal range of motion.  Warm, well perfused.  No edema bilaterally. Skin: No rashes or lesions noted. Lymphatics: No cervical, supraclavicular, or inguinal adenopathy. GU: Normal appearing external genitalia without erythema, excoriation, or lesions.  Speculum exam reveals mildly atrophic vaginal mucosa as well as radiation changes present.  Cuff intact without lesions.  Bimanual exam reveals cuff intact, no nodularity or masses.  Rectovaginal exam  confirms findings.  Laboratory & Radiologic Studies: None new  Assessment & Plan: Becky Dudley is a 71 y.o. 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 MSI-H. MLH1 hypermethylation present.    Patient is doing very well, tolerated vaginal brachytherapy without significant side effects.  Is using her vaginal dilator regularly.  She is NED on exam today.    We reviewed signs and  symptoms that would be concerning for disease recurrence, and I stressed the importance of calling if she develops any of these between visits.    Per NCCN surveillance recommendations, we will plan on surveillance visits every 3 months alternating between my office and radiation oncology.  She sees Dr. Roselind Dudley in September and will see me again in December.  22 minutes of total time was spent for this patient encounter, including preparation, face-to-face counseling with the patient and coordination of care, and documentation of the encounter.  Eugene Garnet, MD  Division of Gynecologic Oncology  Department of Obstetrics and Gynecology  [facility]

## 2022-04-03 NOTE — Patient Instructions (Signed)
It was good to see you today.  I do not see or feel any evidence of cancer recurrence on your exam.  You see Dr. Sondra Come in September.  I will see you back for follow-up in December.  My schedule is not out that far in advance.  Please call back sometime after you see Dr. Sondra Come schedule a visit with me in late December.  If you develop any symptoms concerning for cancer recurrence, such as vaginal bleeding, pelvic pain, or change to bowel function, please call to see me sooner.

## 2022-05-13 ENCOUNTER — Telehealth: Payer: Self-pay | Admitting: *Deleted

## 2022-05-13 NOTE — Telephone Encounter (Signed)
Returned patient's phone call, spoke with patient 

## 2022-07-06 NOTE — Progress Notes (Signed)
Radiation Oncology         ([phone]) [phone] ________________________________  Name: Becky Dudley MRN: [id number]  Date: 07/07/2022  DOB: 12-May-1951  Follow-Up Visit Note  CC: [name], MD  [name], MD    ICD-10-CM   1. Endometrial cancer ([place])  C54.1       Diagnosis:  FIGO grade 2 endometrioid carcinoma; s/p hysterectomy and BSO, Stage pT1a, pN0  Interval Since Last Radiation: 6 months and 2 days   Intent: Curative  Radiation Treatment Dates: 12/03/2021 through 01/02/2022 Site Technique Total Dose (Gy) Dose per Fx (Gy) Completed Fx Beam Energies  Vagina: Pelvis HDR-brachy 30/30 6 5/5 Ir-192    Narrative:  The patient returns today for routine follow-up, she was last seen here for follow-up on 02/24/22. Since her last visit, the patient followed up with Dr. Berline Lopes on 04/03/22. During which time, the patient denied any symptoms concerning for disease recurrence and was noted as NED on examination. The patient also reported using her vaginal dilator regularly.   Otherwise, no significant interval history since the patient was last seen here for follow-up.   She denies any pelvic pain issues with abdominal bloating.  She occasionally has some urinary urgency but no symptoms suggest urinary tract infection.  She continues to use her vaginal dilator.  She denies any bleeding after use.  She denies any hematuria or rectal bleeding.                               Allergies:  is allergic to lisinopril.  Meds: Current Outpatient Medications  Medication Sig Dispense Refill   acetaminophen (TYLENOL) 325 MG tablet Take 650 mg by mouth every 6 (six) hours as needed for mild pain.     amLODipine (NORVASC) 10 MG tablet Take 10 mg by mouth at bedtime.     aspirin 81 MG chewable tablet Chew 81 mg by mouth daily.     Cholecalciferol (VITAMIN D3) 125 MCG (5000 UT) TABS Take 5,000 Units by mouth daily.     diclofenac Sodium (VOLTAREN) 1 % GEL Apply 2 g topically daily as  needed (Arthritis). For knees     loratadine (CLARITIN) 10 MG tablet Take 10 mg by mouth daily as needed for allergies or rhinitis.     Menthol, Topical Analgesic, (BIOFREEZE ROLL-ON EX) Apply 1 application topically daily as needed (Back pain).     naphazoline-pheniramine (VISINE) 0.025-0.3 % ophthalmic solution 1 drop daily as needed (Dry eye).     rosuvastatin (CRESTOR) 5 MG tablet Take 5 mg by mouth at bedtime.     Semaglutide, 1 MG/DOSE, (OZEMPIC, 1 MG/DOSE,) 2 MG/1.5ML SOPN Inject 1 mg into the skin once a week. Injection on Sundays.     sodium chloride (OCEAN) 0.65 % SOLN nasal spray Place 1 spray into both nostrils as needed for congestion.     telmisartan (MICARDIS) 40 MG tablet Take 40 mg by mouth daily.     UNABLE TO FIND Apply 1 application topically daily as needed (Leg cramps). [employer] OTC for leg cramps     zinc gluconate 50 MG tablet Take 25 mg by mouth daily.     calcium carbonate (OSCAL) 1500 (600 Ca) MG TABS tablet Take 1,200 mg by mouth daily. (Patient not taking: Reported on 07/07/2022)     No current facility-administered medications for this encounter.    Physical Findings: The patient is in no acute distress. Patient is alert  and oriented.  height is '5\' 4"'$  (1.626 m) and weight is 212 lb 3.2 oz (96.3 kg). Her temperature is 97.6 F (36.4 C). Her blood pressure is 113/71 and her pulse is 86. Her respiration is 20 and oxygen saturation is 96%. .  No significant changes. Lungs are clear to auscultation bilaterally. Heart has regular rate and rhythm. No palpable cervical, supraclavicular, or axillary adenopathy. Abdomen soft, non-tender, normal bowel sounds.  On pelvic examination the external genitalia were unremarkable. A speculum exam was performed. There are no mucosal lesions noted in the vaginal vault.  A good view of the vaginal cuff was obtained.  Some radiation changes noted at the cuff.  On bimanual and rectovaginal examination no pelvic masses appreciated.  Vaginal  cuff intact.    Lab Findings: Lab Results  Component Value Date   WBC 7.6 10/09/2021   HGB 13.3 10/09/2021   HCT 40.0 10/09/2021   MCV 91.3 10/09/2021   PLT 275 10/09/2021    Radiographic Findings: No results found.  Impression:   FIGO grade 2 endometrioid carcinoma; s/p hysterectomy and BSO, Stage pT1a, pN0  No evidence of recurrence on clinical exam today.  She does not appear to be exhibiting any long-term effects from her surgery and vaginal brachytherapy.  Plan: She will follow-up with Dr. Berline Lopes in December.  Routine follow-up in radiation oncology in March 2024.   20 minutes of total time was spent for this patient encounter, including preparation, face-to-face counseling with the patient and coordination of care, physical exam, and documentation of the encounter. ____________________________________  Blair Promise, PhD, MD  This document serves as a record of services personally performed by Gery Pray, MD. It was created on his behalf by Roney Mans, a trained medical scribe. The creation of this record is based on the scribe's personal observations and the provider's statements to them. This document has been checked and approved by the attending provider.

## 2022-07-07 ENCOUNTER — Other Ambulatory Visit: Payer: Self-pay

## 2022-07-07 ENCOUNTER — Encounter: Payer: Self-pay | Admitting: Radiation Oncology

## 2022-07-07 ENCOUNTER — Ambulatory Visit
Admission: RE | Admit: 2022-07-07 | Discharge: 2022-07-07 | Disposition: A | Discharge status: Home / Self Care | Payer: 59 | Source: Ambulatory Visit | Attending: Radiation Oncology | Admitting: Radiation Oncology

## 2022-07-07 DIAGNOSIS — Z923 Personal history of irradiation: Secondary | ICD-10-CM | POA: Insufficient documentation

## 2022-07-07 DIAGNOSIS — Z8542 Personal history of malignant neoplasm of other parts of uterus: Secondary | ICD-10-CM | POA: Insufficient documentation

## 2022-07-07 DIAGNOSIS — Z79899 Other long term (current) drug therapy: Secondary | ICD-10-CM | POA: Diagnosis not present

## 2022-07-07 DIAGNOSIS — Z7982 Long term (current) use of aspirin: Secondary | ICD-10-CM | POA: Diagnosis not present

## 2022-07-07 DIAGNOSIS — C541 Malignant neoplasm of endometrium: Secondary | ICD-10-CM

## 2022-07-07 NOTE — Progress Notes (Signed)
Becky Dudley is here today for follow up post radiation to the pelvic.  They completed their radiation on: 01/02/22  Does the patient complain of any of the following:  Pain:No Abdominal bloating: Occasionally.  Diarrhea/Constipation: Loose stools.  Nausea/Vomiting: No Vaginal Discharge: No Blood in Urine or Stool: No Urinary Issues (dysuria/incomplete emptying/ incontinence/ increased frequency/urgency): Reports urinary urgency. Patient states there is only a small amount of urine each time.  Does patient report using vaginal dilator 2-3 times a week and/or sexually active 2-3 weeks: Yes Post radiation skin changes: No   Additional comments if applicable:   BP 847/84 (BP Location: Right Arm, Patient Position: Sitting, Cuff Size: Large)   Pulse 86   Temp 97.6 F (36.4 C)   Resp 20   Ht '5\' 4"'$  (1.626 m)   Wt 212 lb 3.2 oz (96.3 kg)   SpO2 96%   BMI 36.42 kg/m

## 2022-08-13 ENCOUNTER — Ambulatory Visit
Admission: RE | Admit: 2022-08-13 | Discharge: 2022-08-13 | Disposition: A | Discharge status: Home / Self Care | Payer: 59 | Source: Ambulatory Visit | Attending: Family Medicine | Admitting: Family Medicine

## 2022-08-13 DIAGNOSIS — R921 Mammographic calcification found on diagnostic imaging of breast: Secondary | ICD-10-CM

## 2022-09-29 ENCOUNTER — Encounter: Payer: Self-pay | Admitting: Gynecologic Oncology

## 2022-09-30 ENCOUNTER — Encounter: Payer: Self-pay | Admitting: Gynecologic Oncology

## 2022-09-30 ENCOUNTER — Other Ambulatory Visit: Payer: Self-pay

## 2022-09-30 ENCOUNTER — Inpatient Hospital Stay: Payer: 59 | Attending: Gynecologic Oncology | Admitting: Gynecologic Oncology

## 2022-09-30 VITALS — BP 159/83 | HR 63 | Temp 97.5°F | Resp 16 | Ht 63.39 in | Wt 212.4 lb

## 2022-09-30 DIAGNOSIS — Z923 Personal history of irradiation: Secondary | ICD-10-CM | POA: Diagnosis not present

## 2022-09-30 DIAGNOSIS — Z90722 Acquired absence of ovaries, bilateral: Secondary | ICD-10-CM | POA: Diagnosis not present

## 2022-09-30 DIAGNOSIS — Z8542 Personal history of malignant neoplasm of other parts of uterus: Secondary | ICD-10-CM | POA: Insufficient documentation

## 2022-09-30 DIAGNOSIS — Z90710 Acquired absence of both cervix and uterus: Secondary | ICD-10-CM | POA: Insufficient documentation

## 2022-09-30 DIAGNOSIS — C541 Malignant neoplasm of endometrium: Secondary | ICD-10-CM

## 2022-09-30 NOTE — Progress Notes (Signed)
Gynecologic Oncology Return Clinic Visit  09/30/22  Reason for Visit: Surveillance visit in the setting of endometrial cancer   Treatment History: Oncology History Overview Note  MSI- H MLH1 promoter hypermethylation is present  MMR IHC MLH1:          LOSS OF NUCLEAR EXPRESSION  MSH2:          Preserved nuclear expression  MSH6:          Preserved nuclear expression  PMS2:          LOSS OF NUCLEAR EXPRESSION   Endometrial cancer ([place])  09/30/2021 Initial Biopsy   Hysteroscopy D&C, and MyoSure resection of anterior wall endometrial polyp (noted to be incomplete).  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   10/04/2021 Initial Diagnosis   Endometrial cancer ([place])   10/09/2021 Imaging   CT A/P: IMPRESSION: Endometrial thickening measuring 10 mm, consistent with known history of endometrial carcinoma.   2 cm subserosal fibroid in anterior uterine fundus.   No evidence of metastatic disease within the abdomen or pelvis.   Colonic diverticulosis, without radiographic evidence of diverticulitis.   Mild hepatic steatosis.   Aortic Atherosclerosis   10/10/2021 Surgery   TRH/BSO, SLN biopsy, cysto, repair vaginal laceration  Findings: On EUA, small mobile uterus. On intra-abdminal entry, some adhesions of omentum to the anterior abdominal wall in the mid right abdomen, filmy.  Normal liver, diaphragm, stomach.  Normal small and large bowel.  Uterus approximately 8 cm in size with a 1-2 cm fundal left fibroid.  Ovaries unremarkable although larger than would expect in a postmenopausal patient.  Normal-appearing fallopian tubes although disrupted.  Clip within the pelvis noted on bladder peritoneum.  Mapping successful bilaterally to external iliac sentinel lymph nodes.  No obvious evidence of extrauterine disease.  Right ovary adherent to the posterior uterus and broad ligament, rectum somewhat adherent posteriorly within the cul-de-sac.  Area of  questionable deserosalization of the rectum was oversewn, negative bubble test.  Given significant adhesions of the bladder to the cervix, cystoscopy was performed, bladder and dome intact, good efflux noted from bilateral ureteral orifices.   10/10/2021 Pathology Results   A. SENTINEL LYMPH NODE, RIGHT EXTERNAL ILIAC, BIOPSY:  -  No carcinoma identified in one lymph node (0/1)  -  See comment   B. SENTINEL LYMPH NODE, LEFT EXTERNAL ILIAC, BIOPSY:  -  No carcinoma identified in one lymph node (0/1)  -  See comment   C. UTERUS, CERVIX, BILATERAL TUBES AND OVARIES:  Uterus:  -  Endometrioid carcinoma, FIGO grade 2, largely involving an  endometrial polyp  -  Benign endometrial polyp  -  Leiomyomata (2.2 cm; largest)  -  Adenomyosis  -  See oncology table and comment below   Cervix:  -  No carcinoma identified   Bilateral Ovaries:  -  No carcinoma identified   Bilateral Fallopian tubes:  -  No carcinoma identified   ONCOLOGY TABLE:   UTERUS, CARCINOMA OR CARCINOSARCOMA: Resection   Procedure: Total hysterectomy and bilateral salpingo-oophorectomy  Histologic Type: Endometrioid carcinoma, NOS  Histologic Grade: FIGO grade 2  Myometrial Invasion:       Depth of Myometrial Invasion (mm): 2       Myometrial Thickness (mm): 22       Percentage of Myometrial Invasion: Less than 50%  Uterine Serosa Involvement: Not identified  Cervical stromal Involvement: Not identified  Extent of involvement of other tissue/organs: Not identified  Peritoneal/Ascitic Fluid: Not submitted unknown  Lymphovascular  Invasion: Not identified  Regional Lymph Nodes:       Pelvic Lymph Nodes Examined:  2 Sentinel                                0 non-sentinel                                   2 total       Pelvic Lymph Nodes with Metastasis: 0                           Macrometastasis: (>2.0 mm): 0                          Micrometastasis: (>0.2 mm and < 2.0 mm): 0                             Isolated Tumor Cells (<0.2 mm): 0                       Laterality of Lymph Node with Tumor: N/A                             Extracapsular Extension: N/A       Para-aortic Lymph Nodes Examined: N/A  Distant Metastasis:       Distant Site(s) Involved: N/A  Pathologic Stage Classification (pTNM, AJCC 8th Edition): pT1a, pN0  Ancillary Studies: MMR / MSI testing will be ordered  Representative Tumor Block: C6  Comment(s): See above   12/03/2021 - 01/02/2022 Radiation Therapy   12/03/2021 through 01/02/2022 Site Technique Total Dose (Gy) Dose per Fx (Gy) Completed Fx Beam Energies  Vagina: Pelvis HDR-brachy 30/30 6 5/5 Ir-192        Interval History: She saw Dr. Sondra Come last in 06/2022.   Doing well.  Denies any vaginal bleeding or discharge.  Is still using her vaginal dilator, but having some trouble remembering to use it as frequently.  Denies any change to bowel or bladder function.  Denies any abdominal or pelvic pain.  Past Medical/Surgical History: Past Medical History:  Diagnosis Date   Arthritis    both knees   CKD (chronic kidney disease), stage III ([place])    no nephrologist   Diabetes mellitus without complication (HCC)    DM2, injection once a week; cbgs normally 110-130   Essential hypertension    Gout    History of radiation therapy    Endometrium- 12/03/21-01/02/22- Dr. Gery Pray   Mixed hyperlipidemia    Morbid obesity ([place])    Pure hypercholesterolemia    uterine ca 09/2021   Vitamin D deficiency    Wears glasses     Past Surgical History:  Procedure Laterality Date   [place] N/A 09/30/2021   Procedure: DILATATION & CURETTAGE/HYSTEROSCOPY WITH MYOSURE;  Surgeon: Brien Few, MD;  Location: [facility];  Service: Gynecology;  Laterality: N/A;   EYE SURGERY     cataract surgery, lens implant on right eye 2008 Left eye 2023   LASIK Bilateral 2002   LYMPH NODE DISSECTION N/A  10/10/2021   Procedure: LYMPH NODE DISSECTION;  Surgeon: Jeral Pinch  R, MD;  Location: [facility];  Service: Gynecology;  Laterality: N/A;   ROBOTIC ASSISTED TOTAL HYSTERECTOMY WITH BILATERAL SALPINGO OOPHERECTOMY Bilateral 10/10/2021   Procedure: XI ROBOTIC ASSISTED TOTAL HYSTERECTOMY WITH BILATERAL SALPINGO OOPHORECTOMY;CYSTOSCOPY;  Surgeon: [name], MD;  Location: [facility];  Service: Gynecology;  Laterality: Bilateral;   SENTINEL NODE BIOPSY N/A 10/10/2021   Procedure: SENTINEL NODE BIOPSY;  Surgeon: [name], MD;  Location: [facility];  Service: Gynecology;  Laterality: N/A;   TONSILLECTOMY  1953    Family History  Problem Relation Age of Onset   Breast cancer Paternal Aunt    Uterine cancer Paternal Aunt    Lung cancer Maternal Uncle    Colon cancer Neg Hx    Ovarian cancer Neg Hx    Endometrial cancer Neg Hx    Pancreatic cancer Neg Hx    Prostate cancer Neg Hx     Social History   Socioeconomic History   Marital status: Single    Spouse name: Not on file   Number of children: Not on file   Years of education: Not on file   Highest education level: Not on file  Occupational History   Not on file  Tobacco Use   Smoking status: Never   Smokeless tobacco: Never  Vaping Use   Vaping Use: Never used  Substance and Sexual Activity   Alcohol use: Never   Drug use: Never   Sexual activity: Not Currently  Other Topics Concern   Not on file  Social History Narrative   Not on file   Social Determinants of Health   Financial Resource Strain: Not on file  Food Insecurity: Not on file  Transportation Needs: Not on file  Physical Activity: Not on file  Stress: Not on file  Social Connections: Not on file    Current Medications:  Current Outpatient Medications:    acetaminophen (TYLENOL) 325 MG tablet, Take 650 mg by mouth every 6 (six) hours as needed for mild pain., Disp: , Rfl:    amLODipine (NORVASC) 10 MG tablet, Take 10 mg by mouth at  bedtime., Disp: , Rfl:    aspirin 81 MG chewable tablet, Chew 81 mg by mouth daily., Disp: , Rfl:    calcium carbonate (OSCAL) 1500 (600 Ca) MG TABS tablet, Take 1,200 mg by mouth daily., Disp: , Rfl:    Cholecalciferol (VITAMIN D3) 125 MCG (5000 UT) TABS, Take 5,000 Units by mouth daily., Disp: , Rfl:    diclofenac Sodium (VOLTAREN) 1 % GEL, Apply 2 g topically daily as needed (Arthritis). For knees, Disp: , Rfl:    loratadine (CLARITIN) 10 MG tablet, Take 10 mg by mouth daily as needed for allergies or rhinitis., Disp: , Rfl:    Menthol, Topical Analgesic, (BIOFREEZE ROLL-ON EX), Apply 1 application topically daily as needed (Back pain)., Disp: , Rfl:    naphazoline-pheniramine (VISINE) 0.025-0.3 % ophthalmic solution, 1 drop daily as needed (Dry eye)., Disp: , Rfl:    rosuvastatin (CRESTOR) 5 MG tablet, Take 5 mg by mouth at bedtime., Disp: , Rfl:    Semaglutide, 1 MG/DOSE, (OZEMPIC, 1 MG/DOSE,) 2 MG/1.5ML SOPN, Inject 1 mg into the skin once a week. Injection on Sundays., Disp: , Rfl:    sodium chloride (OCEAN) 0.65 % SOLN nasal spray, Place 1 spray into both nostrils as needed for congestion., Disp: , Rfl:    telmisartan (MICARDIS) 40 MG tablet, Take 40 mg by mouth daily., Disp: , Rfl:    UNABLE TO FIND, Apply  1 application topically daily as needed (Leg cramps). [facility] OTC for leg cramps, Disp: , Rfl:    zinc gluconate 50 MG tablet, Take 25 mg by mouth daily., Disp: , Rfl:   Review of Systems: Denies appetite changes, fevers, chills, fatigue, unexplained weight changes. Denies hearing loss, neck lumps or masses, mouth sores, ringing in ears or voice changes. Denies cough or wheezing.  Denies shortness of breath. Denies chest pain or palpitations. Denies leg swelling. Denies abdominal distention, pain, blood in stools, constipation, diarrhea, nausea, vomiting, or early satiety. Denies pain with intercourse, dysuria, frequency, hematuria or incontinence. Denies hot flashes, pelvic pain,  vaginal bleeding or vaginal discharge.   Denies joint pain, back pain or muscle pain/cramps. Denies itching, rash, or wounds. Denies dizziness, headaches, numbness or seizures. Denies swollen lymph nodes or glands, denies easy bruising or bleeding. Denies anxiety, depression, confusion, or decreased concentration.  Physical Exam: BP (!) 159/83 (BP Location: Left Arm, Patient Position: Sitting)   Pulse 63   Temp (!) 97.5 F (36.4 C) (Oral)   Resp 16   Ht 5' 3.39" (1.61 m)   Wt 212 lb 6.4 oz (96.3 kg)   SpO2 100%   BMI 37.17 kg/m  General: Alert, oriented, no acute distress. HEENT: Normocephalic, atraumatic, sclera anicteric. Chest: Clear to auscultation bilaterally.  No wheezes or rhonchi. Cardiovascular: Regular rate and rhythm, no murmurs. Abdomen: Obese, soft, nontender.  Normoactive bowel sounds.  No masses or hepatosplenomegaly appreciated.  Well-healed incisions. Extremities: Grossly normal range of motion.  Warm, well perfused.  No edema bilaterally. Skin: No rashes or lesions noted. Lymphatics: No cervical, supraclavicular, or inguinal adenopathy. GU: Normal appearing external genitalia without erythema, excoriation, or lesions.  Speculum exam reveals mildly atrophic vaginal mucosa as well as radiation changes present at the cuff.  Bimanual exam reveals cuff intact, no nodularity or masses.  Minimal narrowing of the vagina at the apex.  Rectovaginal exam confirms findings.  Laboratory & Radiologic Studies: None new  Assessment & Plan: Becky Dudley is a 71 y.o. 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 MSI-H. MLH1 hypermethylation present.    Patient is doing very well.  She is NED on exam today.  There is some minimal narrowing at the apex.  I encouraged the patient to continue using her vaginal dilator regularly.   We reviewed signs and symptoms that  would be concerning for disease recurrence, and I stressed the importance of calling if she develops any of these between visits.  Per NCCN surveillance recommendations, we will plan on surveillance visits every 3 months alternating between my office and radiation oncology.  She sees Dr. Sondra Come in March and will see me again in June.  20 minutes of total time was spent for this patient encounter, including preparation, face-to-face counseling with the patient and coordination of care, and documentation of the encounter.  Jeral Pinch, MD  Division of Gynecologic Oncology  Department of Obstetrics and Gynecology  [facility]

## 2022-09-30 NOTE — Patient Instructions (Signed)
It was good to see you today.  I do not see or feel any evidence of cancer recurrence on your exam.  I will see you for follow-up in 6 months.  As always, if you develop any new and concerning symptoms before your next visit, please call to see me sooner.   

## 2022-12-22 IMAGING — DX DG CHEST 1V PORT
1 series · 1 of 1 positions shown · non-contrast
Comparison: None.

CLINICAL DATA: Shortness of breath for 1 day

EXAM:
PORTABLE CHEST 1 VIEW

[chest]
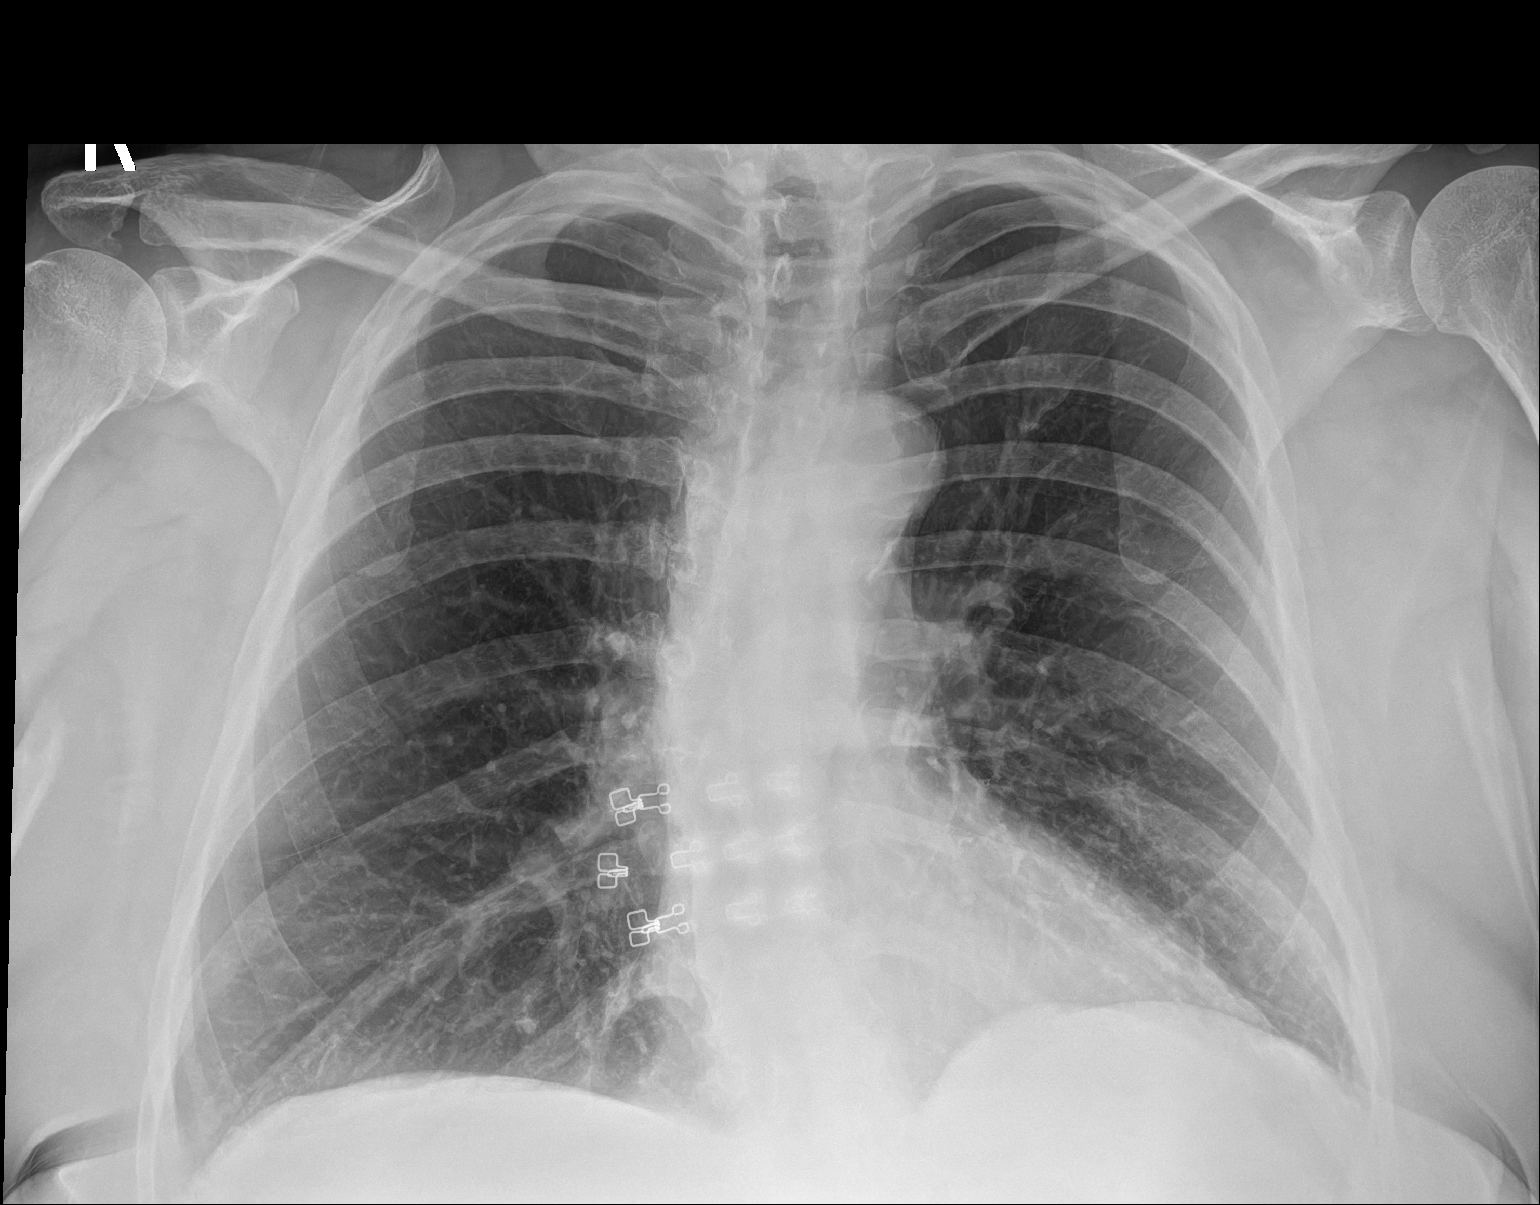

[1 of 1 positions shown; findings below may reference images not displayed]

FINDINGS: Heart size and pulmonary vascularity are normal. Probable
emphysematous changes in the lungs. No airspace disease or
consolidation. No pleural effusions. No pneumothorax. Mediastinal
contours appear intact. Calcified and tortuous aorta.
IMPRESSION: Probable emphysematous changes in the lungs. No evidence of active
pulmonary disease.

## 2023-01-02 NOTE — Progress Notes (Signed)
Radiation Oncology         ([phone]) [phone] ________________________________  Name: Becky Dudley MRN: [id number]  Date: 01/05/2023  DOB: 30-Sep-1951  Follow-Up Visit Note  CC: [name], MD  [name], MD  No diagnosis found.  Diagnosis:  FIGO grade 2 endometrioid carcinoma; s/p hysterectomy and BSO, Stage pT1a, pN0   Interval Since Last Radiation: 1 year and 2 days   Intent: Curative  Radiation Treatment Dates: 12/03/2021 through 01/02/2022 Site Technique Total Dose (Gy) Dose per Fx (Gy) Completed Fx Beam Energies  Vagina: Pelvis HDR-brachy 30/30 6 5/5 Ir-192   Narrative:  The patient returns today for routine follow-up. She was last seen here for follow-up on 07/07/22. Since her last visit ***                              Allergies:  is allergic to lisinopril.  Meds: Current Outpatient Medications  Medication Sig Dispense Refill   acetaminophen (TYLENOL) 325 MG tablet Take 650 mg by mouth every 6 (six) hours as needed for mild pain.     amLODipine (NORVASC) 10 MG tablet Take 10 mg by mouth at bedtime.     aspirin 81 MG chewable tablet Chew 81 mg by mouth daily.     calcium carbonate (OSCAL) 1500 (600 Ca) MG TABS tablet Take 1,200 mg by mouth daily.     Cholecalciferol (VITAMIN D3) 125 MCG (5000 UT) TABS Take 5,000 Units by mouth daily.     diclofenac Sodium (VOLTAREN) 1 % GEL Apply 2 g topically daily as needed (Arthritis). For knees     loratadine (CLARITIN) 10 MG tablet Take 10 mg by mouth daily as needed for allergies or rhinitis.     Menthol, Topical Analgesic, (BIOFREEZE ROLL-ON EX) Apply 1 application topically daily as needed (Back pain).     naphazoline-pheniramine (VISINE) 0.025-0.3 % ophthalmic solution 1 drop daily as needed (Dry eye).     rosuvastatin (CRESTOR) 5 MG tablet Take 5 mg by mouth at bedtime.     Semaglutide, 1 MG/DOSE, (OZEMPIC, 1 MG/DOSE,) 2 MG/1.5ML SOPN Inject 1 mg into the skin once a week. Injection on Sundays.     sodium  chloride (OCEAN) 0.65 % SOLN nasal spray Place 1 spray into both nostrils as needed for congestion.     telmisartan (MICARDIS) 40 MG tablet Take 40 mg by mouth daily.     UNABLE TO FIND Apply 1 application topically daily as needed (Leg cramps). [facility] OTC for leg cramps     zinc gluconate 50 MG tablet Take 25 mg by mouth daily.     No current facility-administered medications for this encounter.    Physical Findings: The patient is in no acute distress. Patient is alert and oriented.  vitals were not taken for this visit. .  No significant changes. Lungs are clear to auscultation bilaterally. Heart has regular rate and rhythm. No palpable cervical, supraclavicular, or axillary adenopathy. Abdomen soft, non-tender, normal bowel sounds.  On pelvic examination the external genitalia were unremarkable. A speculum exam was performed. There are no mucosal lesions noted in the vaginal vault. A Pap smear was obtained of the proximal vagina. On bimanual and rectovaginal examination there were no pelvic masses appreciated.   Lab Findings: Lab Results  Component Value Date   WBC 7.6 10/09/2021   HGB 13.3 10/09/2021   HCT 40.0 10/09/2021   MCV 91.3 10/09/2021   PLT 275 10/09/2021  Radiographic Findings: No results found.  Impression:  FIGO grade 2 endometrioid carcinoma; s/p hysterectomy and BSO, Stage pT1a, pN0   The patient is recovering from the effects of radiation.  ***  Plan:  ***   *** minutes of total time was spent for this patient encounter, including preparation, face-to-face counseling with the patient and coordination of care, physical exam, and documentation of the encounter. ____________________________________  Blair Promise, PhD, MD  This document serves as a record of services personally performed by Gery Pray, MD. It was created on his behalf by Roney Mans, a trained medical scribe. The creation of this record is based on the scribe's personal observations  and the provider's statements to them. This document has been checked and approved by the attending provider.

## 2023-01-05 ENCOUNTER — Ambulatory Visit
Admission: RE | Admit: 2023-01-05 | Discharge: 2023-01-05 | Disposition: A | Discharge status: Home / Self Care | Payer: No Typology Code available for payment source | Source: Ambulatory Visit | Attending: Radiation Oncology | Admitting: Radiation Oncology

## 2023-01-05 ENCOUNTER — Encounter: Payer: Self-pay | Admitting: Radiation Oncology

## 2023-01-05 ENCOUNTER — Other Ambulatory Visit: Payer: Self-pay

## 2023-01-05 VITALS — BP 153/85 | HR 73 | Temp 98.3°F | Resp 16 | Ht 64.0 in | Wt 207.0 lb

## 2023-01-05 DIAGNOSIS — Z7982 Long term (current) use of aspirin: Secondary | ICD-10-CM | POA: Insufficient documentation

## 2023-01-05 DIAGNOSIS — Z923 Personal history of irradiation: Secondary | ICD-10-CM | POA: Diagnosis not present

## 2023-01-05 DIAGNOSIS — Z8542 Personal history of malignant neoplasm of other parts of uterus: Secondary | ICD-10-CM | POA: Diagnosis not present

## 2023-01-05 DIAGNOSIS — C541 Malignant neoplasm of endometrium: Secondary | ICD-10-CM

## 2023-01-05 DIAGNOSIS — Z79899 Other long term (current) drug therapy: Secondary | ICD-10-CM | POA: Diagnosis not present

## 2023-01-05 NOTE — Progress Notes (Signed)
Becky Dudley is here today for follow up post radiation to the pelvic.  They completed their radiation on: 01/02/22   Does the patient complain of any of the following:  Pain:No Abdominal bloating: No Diarrhea/Constipation: No Nausea/Vomiting: no Vaginal Discharge: no Blood in Urine or Stool: no Urinary Issues (dysuria/incomplete emptying/ incontinence/ increased frequency/urgency): No Does patient report using vaginal dilator 2-3 times a week and/or sexually active 2-3 weeks: Yes, using dilator as instructed. Post radiation skin changes: no   Additional comments if applicable:    BP (!) [id number]   Pulse 73   Temp 98.3 F (36.8 C)   Resp 16   Ht 5\' 4"  (1.626 m)   Wt 207 lb (93.9 kg)   SpO2 97%   BMI 35.53 kg/m

## 2023-03-08 ENCOUNTER — Ambulatory Visit (HOSPITAL_COMMUNITY)
Admission: EM | Admit: 2023-03-08 | Discharge: 2023-03-08 | Disposition: A | Discharge status: Home / Self Care | Payer: No Typology Code available for payment source | Attending: Emergency Medicine | Admitting: Emergency Medicine

## 2023-03-08 ENCOUNTER — Encounter (HOSPITAL_COMMUNITY): Payer: Self-pay

## 2023-03-08 DIAGNOSIS — L03113 Cellulitis of right upper limb: Secondary | ICD-10-CM | POA: Diagnosis not present

## 2023-03-08 MED ORDER — CEPHALEXIN 500 MG PO CAPS: 500.0000 mg | ORAL_CAPSULE | Freq: Three times a day (TID) | ORAL | 0 refills | Status: AC

## 2023-03-08 NOTE — ED Provider Notes (Signed)
[facility]    CSN: [id number] Arrival date & time: 03/08/23  1620      History   Chief Complaint Chief Complaint  Patient presents with   Insect Bite    HPI Becky Dudley is a 72 y.o. female.   Patient presents for evaluation of erythema, warmth to the skin and swelling beginning today after what she believes to be a insect bite .  Initially began off as 2 small Ashten Sarnowski whelps but have progressively worsened throughout the day.  Symptoms started after leaning over her deck where she did not feel what she believes to be a insect bite, type of insect unknown.  Mild pruritus around the border.  Denies fever or drainage.  Has attempted to clean with alcohol and applied after bite.   Past Medical History:  Diagnosis Date   Arthritis    both knees   CKD (chronic kidney disease), stage III (HCC)    no nephrologist   Diabetes mellitus without complication (HCC)    DM2, injection once a week; cbgs normally 110-130   Essential hypertension    Gout    History of radiation therapy    Endometrium- 12/03/21-01/02/22- Dr. Antony Blackbird   Mixed hyperlipidemia    Morbid obesity (HCC)    Pure hypercholesterolemia    uterine ca 09/2021   Vitamin D deficiency    Wears glasses     Patient Active Problem List   Diagnosis Date Noted   Endometrial cancer (HCC) 10/04/2021   Adult BMI 36.0-36.9 kg/sq m 10/04/2021   CKD (chronic kidney disease) stage 3, GFR 30-59 ml/min (HCC) 10/04/2021   HTN (hypertension) 10/04/2021   HLD (hyperlipidemia) 10/04/2021    Past Surgical History:  Procedure Laterality Date   CHOLECYSTECTOMY  1989   DILATATION & CURETTAGE/HYSTEROSCOPY WITH MYOSURE N/A 09/30/2021   Procedure: DILATATION & CURETTAGE/HYSTEROSCOPY WITH MYOSURE;  Surgeon: Olivia Mackie, MD;  Location: [facility];  Service: Gynecology;  Laterality: N/A;   EYE SURGERY     cataract surgery, lens implant on right eye 2008 Left eye 2023   LASIK Bilateral 2002   LYMPH  NODE DISSECTION N/A 10/10/2021   Procedure: LYMPH NODE DISSECTION;  Surgeon: Carver Fila, MD;  Location: [facility];  Service: Gynecology;  Laterality: N/A;   ROBOTIC ASSISTED TOTAL HYSTERECTOMY WITH BILATERAL SALPINGO OOPHERECTOMY Bilateral 10/10/2021   Procedure: XI ROBOTIC ASSISTED TOTAL HYSTERECTOMY WITH BILATERAL SALPINGO OOPHORECTOMY;CYSTOSCOPY;  Surgeon: Carver Fila, MD;  Location: [facility];  Service: Gynecology;  Laterality: Bilateral;   SENTINEL NODE BIOPSY N/A 10/10/2021   Procedure: SENTINEL NODE BIOPSY;  Surgeon: Carver Fila, MD;  Location: [facility];  Service: Gynecology;  Laterality: N/A;   TONSILLECTOMY  1953    OB History     Gravida  2   Para  2   Term      Preterm      AB      Living         SAB      IAB      Ectopic      Multiple      Live Births               Home Medications    Prior to Admission medications   Medication Sig Start Date End Date Taking? Authorizing Provider  acetaminophen (TYLENOL) 325 MG tablet Take 650 mg by mouth every 6 (six) hours as needed for mild pain.   Yes [provider]  amLODipine (NORVASC)  10 MG tablet Take 10 mg by mouth at bedtime.   Yes [provider]  aspirin 81 MG chewable tablet Chew 81 mg by mouth daily.   Yes [provider]  BIOTIN MAXIMUM PO Take by mouth.   Yes [provider]  calcium carbonate (OSCAL) 1500 (600 Ca) MG TABS tablet Take 1,200 mg by mouth daily.   Yes [provider]  Cholecalciferol (VITAMIN D3) 125 MCG (5000 UT) TABS Take 5,000 Units by mouth daily.   Yes [provider]  diclofenac Sodium (VOLTAREN) 1 % GEL Apply 2 g topically daily as needed (Arthritis). For knees   Yes [provider]  loratadine (CLARITIN) 10 MG tablet Take 10 mg by mouth daily as needed for allergies or rhinitis.   Yes [provider]  Menthol, Topical Analgesic, (BIOFREEZE ROLL-ON EX) Apply 1 application topically daily as  needed (Back pain).   Yes [provider]  naphazoline-pheniramine (VISINE) 0.025-0.3 % ophthalmic solution 1 drop daily as needed (Dry eye).   Yes [provider]  rosuvastatin (CRESTOR) 5 MG tablet Take 5 mg by mouth at bedtime.   Yes [provider]  Semaglutide, 1 MG/DOSE, (OZEMPIC, 1 MG/DOSE,) 2 MG/1.5ML SOPN Inject 1 mg into the skin once a week. Injection on Sundays.   Yes [provider]  sodium chloride (OCEAN) 0.65 % SOLN nasal spray Place 1 spray into both nostrils as needed for congestion.   Yes [provider]  telmisartan (MICARDIS) 40 MG tablet Take 40 mg by mouth daily.   Yes [provider]  UNABLE TO FIND Apply 1 application topically daily as needed (Leg cramps). Hylands OTC for leg cramps   Yes [provider]  zinc gluconate 50 MG tablet Take 25 mg by mouth daily.   Yes [provider]    Family History Family History  Problem Relation Age of Onset   Breast cancer Paternal Aunt    Uterine cancer Paternal Aunt    Lung cancer Maternal Uncle    Colon cancer Neg Hx    Ovarian cancer Neg Hx    Endometrial cancer Neg Hx    Pancreatic cancer Neg Hx    Prostate cancer Neg Hx     Social History Social History   Tobacco Use   Smoking status: Never   Smokeless tobacco: Never  Vaping Use   Vaping Use: Never used  Substance Use Topics   Alcohol use: Never   Drug use: Never     Allergies   Lisinopril   Review of Systems Review of Systems   Physical Exam Triage Vital Signs ED Triage Vitals  Enc Vitals Group     BP 03/08/23 1638 (!) 141/80     Pulse Rate 03/08/23 1638 74     Resp 03/08/23 1638 18     Temp 03/08/23 1638 98 F (36.7 C)     Temp Source 03/08/23 1638 Oral     SpO2 03/08/23 1638 95 %     Weight --      Height --      Head Circumference --      Peak Flow --      Pain Score 03/08/23 1635 0     Pain Loc --      Pain Edu? --      Excl. in GC? --    No data  found.  Updated Vital Signs BP (!) 141/80 (BP Location: Left Arm)   Pulse 74   Temp 98 F (  36.7 C) (Oral)   Resp 18   SpO2 95%   Visual Acuity Right Eye Distance:   Left Eye Distance:   Bilateral Distance:    Right Eye Near:   Left Eye Near:    Bilateral Near:     Physical Exam Constitutional:      Appearance: Normal appearance.  Eyes:     Extraocular Movements: Extraocular movements intact.  Pulmonary:     Effort: Pulmonary effort is normal.  Skin:    Comments: Erythema, mild to moderate swelling and heat to the skin present to the anterior of the right forearm presented in a circular lesion proximately the size of an orange  Neurological:     Mental Status: She is alert and oriented to person, place, and time. Mental status is at baseline.      UC Treatments / Results  Labs (all labs ordered are listed, but only abnormal results are displayed) Labs Reviewed - No data to display  EKG   Radiology No results found.  Procedures Procedures (including critical care time)  Medications Ordered in UC Medications - No data to display  Initial Impression / Assessment and Plan / UC Course  I have reviewed the triage vital signs and the nursing notes.  Pertinent labs & imaging results that were available during my care of the patient were reviewed by me and considered in my medical decision making (see chart for details).  Cellulitis of the right arm  Presentation is consistent with infection, discussed with patient, prescribed cephalexin and recommended additional supportive measures for management of pruritus and general comfort, given strict precautions that if any concerns regarding healing to follow-up for reevaluation Final Clinical Impressions(s) / UC Diagnoses   Final diagnoses:  Cellulitis of right arm   Discharge Instructions   None    ED Prescriptions   None    PDMP not reviewed this encounter.   Valinda Hoar, NP 03/08/23 1705

## 2023-03-08 NOTE — Discharge Instructions (Signed)
Based on the presentation of neuroma do believe it is infected most likely to the break in the skin with insect bites  Begin cephalexin every 8 hours for the next 7 days to clear out any return  You may use ice or heat over the affected area in 10 to 15-minute intervals whichever provides the most comfort  You may elevate arm on 2 pillows when sitting and lying which helps to reduce swelling  You may apply topical hydrocortisone cream twice daily over the affected area for management of itching as needed  For any concerns regarding healing please return for reevaluation

## 2023-03-08 NOTE — ED Triage Notes (Addendum)
Patient was bit around 0800 today but did not see what bit here. Was leaned on her gate and felt the bite on her right forearm. Onset with 1-2 white welts and is growing. \  Has used after-bite and ice with little relief.

## 2023-03-26 ENCOUNTER — Encounter: Payer: Self-pay | Admitting: Gynecologic Oncology

## 2023-03-27 ENCOUNTER — Encounter: Payer: Self-pay | Admitting: Gynecologic Oncology

## 2023-03-27 ENCOUNTER — Inpatient Hospital Stay: Payer: No Typology Code available for payment source | Attending: Gynecologic Oncology | Admitting: Gynecologic Oncology

## 2023-03-27 ENCOUNTER — Other Ambulatory Visit: Payer: Self-pay

## 2023-03-27 VITALS — BP 134/68 | HR 72 | Temp 98.4°F | Ht 64.96 in | Wt 211.4 lb

## 2023-03-27 DIAGNOSIS — Z90710 Acquired absence of both cervix and uterus: Secondary | ICD-10-CM | POA: Diagnosis not present

## 2023-03-27 DIAGNOSIS — Z90722 Acquired absence of ovaries, bilateral: Secondary | ICD-10-CM | POA: Insufficient documentation

## 2023-03-27 DIAGNOSIS — Z923 Personal history of irradiation: Secondary | ICD-10-CM | POA: Insufficient documentation

## 2023-03-27 DIAGNOSIS — C541 Malignant neoplasm of endometrium: Secondary | ICD-10-CM

## 2023-03-27 DIAGNOSIS — Z8542 Personal history of malignant neoplasm of other parts of uterus: Secondary | ICD-10-CM | POA: Insufficient documentation

## 2023-03-27 NOTE — Patient Instructions (Signed)
It was good to see you today.  I do not see or feel any evidence of cancer recurrence on your exam.  I will see you for follow-up in 6 months.  As always, if you develop any new and concerning symptoms before your next visit, please call to see me sooner.   

## 2023-03-27 NOTE — Progress Notes (Signed)
Gynecologic Oncology Return Clinic Visit  03/27/23  Reason for Visit: Surveillance visit in the setting of endometrial cancer   Treatment History: Oncology History Overview Note  MSI- H MLH1 promoter hypermethylation is present  MMR IHC MLH1:          LOSS OF NUCLEAR EXPRESSION  MSH2:          Preserved nuclear expression  MSH6:          Preserved nuclear expression  PMS2:          LOSS OF NUCLEAR EXPRESSION   Endometrial cancer (HCC)  09/30/2021 Initial Biopsy   Hysteroscopy D&C, and MyoSure resection of anterior wall endometrial polyp (noted to be incomplete).  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   10/04/2021 Initial Diagnosis   Endometrial cancer (HCC)   10/09/2021 Imaging   CT A/P: IMPRESSION: Endometrial thickening measuring 10 mm, consistent with known history of endometrial carcinoma.   2 cm subserosal fibroid in anterior uterine fundus.   No evidence of metastatic disease within the abdomen or pelvis.   Colonic diverticulosis, without radiographic evidence of diverticulitis.   Mild hepatic steatosis.   Aortic Atherosclerosis   10/10/2021 Surgery   TRH/BSO, SLN biopsy, cysto, repair vaginal laceration  Findings: On EUA, small mobile uterus. On intra-abdminal entry, some adhesions of omentum to the anterior abdominal wall in the mid right abdomen, filmy.  Normal liver, diaphragm, stomach.  Normal small and large bowel.  Uterus approximately 8 cm in size with a 1-2 cm fundal left fibroid.  Ovaries unremarkable although larger than would expect in a postmenopausal patient.  Normal-appearing fallopian tubes although disrupted.  Clip within the pelvis noted on bladder peritoneum.  Mapping successful bilaterally to external iliac sentinel lymph nodes.  No obvious evidence of extrauterine disease.  Right ovary adherent to the posterior uterus and broad ligament, rectum somewhat adherent posteriorly within the cul-de-sac.  Area of  questionable deserosalization of the rectum was oversewn, negative bubble test.  Given significant adhesions of the bladder to the cervix, cystoscopy was performed, bladder and dome intact, good efflux noted from bilateral ureteral orifices.   10/10/2021 Pathology Results   A. SENTINEL LYMPH NODE, RIGHT EXTERNAL ILIAC, BIOPSY:  -  No carcinoma identified in one lymph node (0/1)  -  See comment   B. SENTINEL LYMPH NODE, LEFT EXTERNAL ILIAC, BIOPSY:  -  No carcinoma identified in one lymph node (0/1)  -  See comment   C. UTERUS, CERVIX, BILATERAL TUBES AND OVARIES:  Uterus:  -  Endometrioid carcinoma, FIGO grade 2, largely involving an  endometrial polyp  -  Benign endometrial polyp  -  Leiomyomata (2.2 cm; largest)  -  Adenomyosis  -  See oncology table and comment below   Cervix:  -  No carcinoma identified   Bilateral Ovaries:  -  No carcinoma identified   Bilateral Fallopian tubes:  -  No carcinoma identified   ONCOLOGY TABLE:   UTERUS, CARCINOMA OR CARCINOSARCOMA: Resection   Procedure: Total hysterectomy and bilateral salpingo-oophorectomy  Histologic Type: Endometrioid carcinoma, NOS  Histologic Grade: FIGO grade 2  Myometrial Invasion:       Depth of Myometrial Invasion (mm): 2       Myometrial Thickness (mm): 22       Percentage of Myometrial Invasion: Less than 50%  Uterine Serosa Involvement: Not identified  Cervical stromal Involvement: Not identified  Extent of involvement of other tissue/organs: Not identified  Peritoneal/Ascitic Fluid: Not submitted unknown  Lymphovascular  Invasion: Not identified  Regional Lymph Nodes:       Pelvic Lymph Nodes Examined:  2 Sentinel                                0 non-sentinel                                   2 total       Pelvic Lymph Nodes with Metastasis: 0                           Macrometastasis: (>2.0 mm): 0                          Micrometastasis: (>0.2 mm and < 2.0 mm): 0                             Isolated Tumor Cells (<0.2 mm): 0                       Laterality of Lymph Node with Tumor: N/A                             Extracapsular Extension: N/A       Para-aortic Lymph Nodes Examined: N/A  Distant Metastasis:       Distant Site(s) Involved: N/A  Pathologic Stage Classification (pTNM, AJCC 8th Edition): pT1a, pN0  Ancillary Studies: MMR / MSI testing will be ordered  Representative Tumor Block: C6  Comment(s): See above   12/03/2021 - 01/02/2022 Radiation Therapy   12/03/2021 through 01/02/2022 Site Technique Total Dose (Gy) Dose per Fx (Gy) Completed Fx Beam Energies  Vagina: Pelvis HDR-brachy 30/30 6 5/5 [id number]        Interval History: Patient reports overall doing well.  She denies any vaginal bleeding or discharge.  She reports normal bowel and bladder function.  She denies any abdominal or pelvic pain.  Past Medical/Surgical History: Past Medical History:  Diagnosis Date   Arthritis    both knees   CKD (chronic kidney disease), stage III (HCC)    no nephrologist   Diabetes mellitus without complication (HCC)    DM2, injection once a week; cbgs normally 110-130   Essential hypertension    Gout    History of radiation therapy    Endometrium- 12/03/21-01/02/22- Dr. Antony Blackbird   Mixed hyperlipidemia    Morbid obesity (HCC)    Pure hypercholesterolemia    uterine ca 09/2021   Vitamin D deficiency    Wears glasses     Past Surgical History:  Procedure Laterality Date   CHOLECYSTECTOMY  1989   DILATATION & CURETTAGE/HYSTEROSCOPY WITH MYOSURE N/A 09/30/2021   Procedure: DILATATION & CURETTAGE/HYSTEROSCOPY WITH MYOSURE;  Surgeon: Olivia Mackie, MD;  Location: [facility];  Service: Gynecology;  Laterality: N/A;   EYE SURGERY     cataract surgery, lens implant on right eye 2008 Left eye 2023   LASIK Bilateral 2002   LYMPH NODE DISSECTION N/A 10/10/2021   Procedure: LYMPH NODE DISSECTION;  Surgeon: Carver Fila, MD;  Location: [facility];   Service: Gynecology;  Laterality: N/A;   ROBOTIC ASSISTED TOTAL HYSTERECTOMY WITH BILATERAL SALPINGO OOPHERECTOMY  Bilateral 10/10/2021   Procedure: XI ROBOTIC ASSISTED TOTAL HYSTERECTOMY WITH BILATERAL SALPINGO OOPHORECTOMY;CYSTOSCOPY;  Surgeon: Carver Fila, MD;  Location: [facility];  Service: Gynecology;  Laterality: Bilateral;   SENTINEL NODE BIOPSY N/A 10/10/2021   Procedure: SENTINEL NODE BIOPSY;  Surgeon: Carver Fila, MD;  Location: [facility];  Service: Gynecology;  Laterality: N/A;   TONSILLECTOMY  1953    Family History  Problem Relation Age of Onset   Breast cancer Paternal Aunt    Uterine cancer Paternal Aunt    Lung cancer Maternal Uncle    Colon cancer Neg Hx    Ovarian cancer Neg Hx    Endometrial cancer Neg Hx    Pancreatic cancer Neg Hx    Prostate cancer Neg Hx     Social History   Socioeconomic History   Marital status: Single    Spouse name: Not on file   Number of children: Not on file   Years of education: Not on file   Highest education level: Not on file  Occupational History   Not on file  Tobacco Use   Smoking status: Never   Smokeless tobacco: Never  Vaping Use   Vaping Use: Never used  Substance and Sexual Activity   Alcohol use: Never   Drug use: Never   Sexual activity: Not Currently  Other Topics Concern   Not on file  Social History Narrative   Not on file   Social Determinants of Health   Financial Resource Strain: Not on file  Food Insecurity: Not on file  Transportation Needs: Not on file  Physical Activity: Not on file  Stress: Not on file  Social Connections: Not on file    Current Medications:  Current Outpatient Medications:    acetaminophen (TYLENOL) 325 MG tablet, Take 650 mg by mouth every 6 (six) hours as needed for mild pain., Disp: , Rfl:    amLODipine (NORVASC) 10 MG tablet, Take 10 mg by mouth at bedtime., Disp: , Rfl:    aspirin 81 MG chewable tablet, Chew 81 mg by mouth daily., Disp: , Rfl:    BIOTIN  MAXIMUM PO, Take by mouth., Disp: , Rfl:    calcium carbonate (OSCAL) 1500 (600 Ca) MG TABS tablet, Take 1,200 mg by mouth daily., Disp: , Rfl:    Cholecalciferol (VITAMIN D3) 125 MCG (5000 UT) TABS, Take 5,000 Units by mouth daily., Disp: , Rfl:    diclofenac Sodium (VOLTAREN) 1 % GEL, Apply 2 g topically daily as needed (Arthritis). For knees, Disp: , Rfl:    loratadine (CLARITIN) 10 MG tablet, Take 10 mg by mouth daily as needed for allergies or rhinitis., Disp: , Rfl:    Menthol, Topical Analgesic, (BIOFREEZE ROLL-ON EX), Apply 1 application topically daily as needed (Back pain)., Disp: , Rfl:    naphazoline-pheniramine (VISINE) 0.025-0.3 % ophthalmic solution, 1 drop daily as needed (Dry eye)., Disp: , Rfl:    rosuvastatin (CRESTOR) 5 MG tablet, Take 5 mg by mouth at bedtime., Disp: , Rfl:    Semaglutide, 1 MG/DOSE, (OZEMPIC, 1 MG/DOSE,) 2 MG/1.5ML SOPN, Inject 1 mg into the skin once a week. Injection on Sundays., Disp: , Rfl:    sodium chloride (OCEAN) 0.65 % SOLN nasal spray, Place 1 spray into both nostrils as needed for congestion., Disp: , Rfl:    telmisartan (MICARDIS) 40 MG tablet, Take 40 mg by mouth daily., Disp: , Rfl:    UNABLE TO FIND, Apply 1 application topically daily as needed (Leg cramps). Hylands OTC  for leg cramps, Disp: , Rfl:    zinc gluconate 50 MG tablet, Take 25 mg by mouth daily., Disp: , Rfl:   Review of Systems: Denies appetite changes, fevers, chills, fatigue, unexplained weight changes. Denies hearing loss, neck lumps or masses, mouth sores, ringing in ears or voice changes. Denies cough or wheezing.  Denies shortness of breath. Denies chest pain or palpitations. Denies leg swelling. Denies abdominal distention, pain, blood in stools, constipation, diarrhea, nausea, vomiting, or early satiety. Denies pain with intercourse, dysuria, frequency, hematuria or incontinence. Denies hot flashes, pelvic pain, vaginal bleeding or vaginal discharge.   Denies joint pain,  back pain or muscle pain/cramps. Denies itching, rash, or wounds. Denies dizziness, headaches, numbness or seizures. Denies swollen lymph nodes or glands, denies easy bruising or bleeding. Denies anxiety, depression, confusion, or decreased concentration.  Physical Exam: BP 134/68 (BP Location: Right Arm, Patient Position: Sitting)   Pulse 72   Temp 98.4 F (36.9 C) (Oral)   Ht 5' 4.96" (1.65 m)   Wt 211 lb 6.4 oz (95.9 kg)   SpO2 97%   BMI 35.22 kg/m  General: Alert, oriented, no acute distress. HEENT: Normocephalic, atraumatic, sclera anicteric. Chest: Clear to auscultation bilaterally.  No wheezes or rhonchi. Cardiovascular: Regular rate and rhythm, no murmurs. Abdomen: Obese, soft, nontender.  Normoactive bowel sounds.  No masses or hepatosplenomegaly appreciated.  Well-healed incisions. Extremities: Grossly normal range of motion.  Warm, well perfused.  No edema bilaterally. Skin: No rashes or lesions noted. Lymphatics: No cervical, supraclavicular, or inguinal adenopathy. GU: Normal appearing external genitalia without erythema, excoriation, or lesions.  Speculum exam reveals mildly atrophic vaginal mucosa as well as radiation changes present at the cuff.  Bimanual exam reveals cuff intact, no nodularity or masses.  Minimal narrowing of the vagina at the apex.  Rectovaginal exam confirms findings.  Laboratory & Radiologic Studies: None new  Assessment & Plan: Becky Dudley is a 72 y.o. 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 MSI-H. MLH1 hypermethylation present.    Patient is doing very well.  She is NED on exam today.  The patient is using her vaginal dilator once or twice a week.   We reviewed signs and symptoms that would be concerning for disease recurrence, and I stressed the importance of calling if she develops any of these between visits.  Per NCCN  surveillance recommendations, we will plan on surveillance visits every 3 months alternating between my office and radiation oncology.  She sees Dr. Roselind Messier in September and will see me again in December.  20 minutes of total time was spent for this patient encounter, including preparation, face-to-face counseling with the patient and coordination of care, and documentation of the encounter.  Eugene Garnet, MD  Division of Gynecologic Oncology  Department of Obstetrics and Gynecology  [facility]

## 2023-06-28 NOTE — Progress Notes (Signed)
Radiation Oncology         [phone] ________________________________  Name: Becky Dudley MRN: [id number]  Date: 06/29/2023  DOB: 1950-11-23  Follow-Up Visit Note  CC: Shon Hale, MD  Carver Fila, MD  No diagnosis found.  Diagnosis: FIGO grade 2 endometrioid carcinoma; s/p hysterectomy and BSO, Stage pT1a, pN0    Interval Since Last Radiation: 1 year, 5 months, and 24 days   Intent: Curative  Radiation Treatment Dates: 12/03/2021 through 01/02/2022 Site Technique Total Dose (Gy) Dose per Fx (Gy) Completed Fx Beam Energies  Vagina: Pelvis HDR-brachy 30/30 6 5/5 Ir-192   Narrative:  The patient returns today for routine follow-up. She was last seen here for follow-up on 01/05/23.     Since her last visit, the patient followed up with Dr. Pricilla Holm on 03/27/23. During which time, the patient denied any symptoms concerning for disease recurrence and she was noted as NED on examination.        Of note: the patient presented to the ED on 03/08/23 with erythema, warmth, and swelling of the right arm. Her symptoms were ultimately attributed to cellulitis and she was prescribed cephalexin.   No other significant interval history since the patient was last seen for follow-up.   ***                      Allergies:  is allergic to lisinopril.  Meds: Current Outpatient Medications  Medication Sig Dispense Refill   acetaminophen (TYLENOL) 325 MG tablet Take 650 mg by mouth every 6 (six) hours as needed for mild pain.     amLODipine (NORVASC) 10 MG tablet Take 10 mg by mouth at bedtime.     aspirin 81 MG chewable tablet Chew 81 mg by mouth daily.     BIOTIN MAXIMUM PO Take by mouth.     calcium carbonate (OSCAL) 1500 (600 Ca) MG TABS tablet Take 1,200 mg by mouth daily.     Cholecalciferol (VITAMIN D3) 125 MCG (5000 UT) TABS Take 5,000 Units by mouth daily.     diclofenac Sodium (VOLTAREN) 1 % GEL Apply 2 g topically daily as needed (Arthritis). For knees      loratadine (CLARITIN) 10 MG tablet Take 10 mg by mouth daily as needed for allergies or rhinitis.     Menthol, Topical Analgesic, (BIOFREEZE ROLL-ON EX) Apply 1 application topically daily as needed (Back pain).     naphazoline-pheniramine (VISINE) 0.025-0.3 % ophthalmic solution 1 drop daily as needed (Dry eye).     rosuvastatin (CRESTOR) 5 MG tablet Take 5 mg by mouth at bedtime.     Semaglutide, 1 MG/DOSE, (OZEMPIC, 1 MG/DOSE,) 2 MG/1.5ML SOPN Inject 1 mg into the skin once a week. Injection on Sundays.     sodium chloride (OCEAN) 0.65 % SOLN nasal spray Place 1 spray into both nostrils as needed for congestion.     telmisartan (MICARDIS) 40 MG tablet Take 40 mg by mouth daily.     UNABLE TO FIND Apply 1 application topically daily as needed (Leg cramps). [facility] OTC for leg cramps     zinc gluconate 50 MG tablet Take 25 mg by mouth daily.     No current facility-administered medications for this encounter.    Physical Findings: The patient is in no acute distress. Patient is alert and oriented.  vitals were not taken for this visit. .  No significant changes. Lungs are clear to auscultation bilaterally. Heart has regular rate and rhythm. No  palpable cervical, supraclavicular, or axillary adenopathy. Abdomen soft, non-tender, normal bowel sounds.  On pelvic examination the external genitalia were unremarkable. A speculum exam was performed. There are no mucosal lesions noted in the vaginal vault. A Pap smear was obtained of the proximal vagina. On bimanual and rectovaginal examination there were no pelvic masses appreciated. ***   Lab Findings: Lab Results  Component Value Date   WBC 7.6 10/09/2021   HGB 13.3 10/09/2021   HCT 40.0 10/09/2021   MCV 91.3 10/09/2021   PLT 275 10/09/2021    Radiographic Findings: No results found.  Impression: FIGO grade 2 endometrioid carcinoma; s/p hysterectomy and BSO, Stage pT1a, pN0     The patient is recovering from the effects of radiation.   ***  Plan:  ***   *** minutes of total time was spent for this patient encounter, including preparation, face-to-face counseling with the patient and coordination of care, physical exam, and documentation of the encounter. ____________________________________  Billie Lade, PhD, MD  This document serves as a record of services personally performed by Antony Blackbird, MD. It was created on his behalf by Neena Rhymes, a trained medical scribe. The creation of this record is based on the scribe's personal observations and the provider's statements to them. This document has been checked and approved by the attending provider.

## 2023-06-29 ENCOUNTER — Encounter: Payer: Self-pay | Admitting: Radiation Oncology

## 2023-06-29 ENCOUNTER — Ambulatory Visit
Admission: RE | Admit: 2023-06-29 | Discharge: 2023-06-29 | Disposition: A | Discharge status: Home / Self Care | Payer: No Typology Code available for payment source | Source: Ambulatory Visit | Attending: Radiation Oncology | Admitting: Radiation Oncology

## 2023-06-29 VITALS — BP 119/61 | HR 76 | Temp 98.4°F | Resp 20 | Ht 64.96 in | Wt 215.8 lb

## 2023-06-29 DIAGNOSIS — Z90710 Acquired absence of both cervix and uterus: Secondary | ICD-10-CM | POA: Diagnosis not present

## 2023-06-29 DIAGNOSIS — Z79899 Other long term (current) drug therapy: Secondary | ICD-10-CM | POA: Diagnosis not present

## 2023-06-29 DIAGNOSIS — Z90722 Acquired absence of ovaries, bilateral: Secondary | ICD-10-CM | POA: Insufficient documentation

## 2023-06-29 DIAGNOSIS — Z8542 Personal history of malignant neoplasm of other parts of uterus: Secondary | ICD-10-CM | POA: Insufficient documentation

## 2023-06-29 DIAGNOSIS — Z923 Personal history of irradiation: Secondary | ICD-10-CM | POA: Diagnosis not present

## 2023-06-29 DIAGNOSIS — C541 Malignant neoplasm of endometrium: Secondary | ICD-10-CM

## 2023-06-29 NOTE — Progress Notes (Signed)
Becky Dudley is here today for follow up post radiation to the pelvic.  They completed their radiation on: 01/02/22   Does the patient complain of any of the following:  Pain: No Abdominal bloating: Occasionally  Diarrhea/Constipation: Soft stools due to ozempic.  Nausea/Vomiting:Reports post nasal drip that makes patient cough then vomit. Vaginal Discharge: No Blood in Urine or Stool: No Urinary Issues (dysuria/incomplete emptying/ incontinence/ increased frequency/urgency): No Does patient report using vaginal dilator 2-3 times a week and/or sexually active 2-3 weeks: Patient using dilators 1-2 times per week.  Post radiation skin changes: No   Additional comments if applicable:  BP 119/61 (BP Location: Right Arm, Patient Position: Sitting, Cuff Size: Large)   Pulse 76   Temp 98.4 F (36.9 C)   Resp 20   Ht 5' 4.96" (1.65 m)   Wt 215 lb 12.8 oz (97.9 kg)   SpO2 98%   BMI 35.96 kg/m

## 2023-07-06 ENCOUNTER — Other Ambulatory Visit: Payer: Self-pay | Admitting: Family Medicine

## 2023-07-06 DIAGNOSIS — R921 Mammographic calcification found on diagnostic imaging of breast: Secondary | ICD-10-CM

## 2023-08-19 ENCOUNTER — Ambulatory Visit
Admission: RE | Admit: 2023-08-19 | Discharge: 2023-08-19 | Disposition: A | Discharge status: Home / Self Care | Payer: No Typology Code available for payment source | Source: Ambulatory Visit | Attending: Family Medicine

## 2023-08-19 ENCOUNTER — Other Ambulatory Visit: Payer: Self-pay | Admitting: Family Medicine

## 2023-08-19 ENCOUNTER — Ambulatory Visit
Admission: RE | Admit: 2023-08-19 | Discharge: 2023-08-19 | Disposition: A | Discharge status: Home / Self Care | Payer: No Typology Code available for payment source | Source: Ambulatory Visit | Attending: Family Medicine | Admitting: Family Medicine

## 2023-08-19 DIAGNOSIS — N6489 Other specified disorders of breast: Secondary | ICD-10-CM

## 2023-08-19 DIAGNOSIS — R921 Mammographic calcification found on diagnostic imaging of breast: Secondary | ICD-10-CM

## 2023-08-21 ENCOUNTER — Ambulatory Visit
Admission: RE | Admit: 2023-08-21 | Discharge: 2023-08-21 | Disposition: A | Discharge status: Home / Self Care | Payer: No Typology Code available for payment source | Source: Ambulatory Visit | Attending: Family Medicine | Admitting: Family Medicine

## 2023-08-21 DIAGNOSIS — N6489 Other specified disorders of breast: Secondary | ICD-10-CM

## 2023-08-21 HISTORY — PX: BREAST BIOPSY: SHX20

## 2023-08-24 LAB — SURGICAL PATHOLOGY

## 2023-09-01 ENCOUNTER — Encounter: Payer: Self-pay | Admitting: Family Medicine

## 2023-09-01 ENCOUNTER — Other Ambulatory Visit: Payer: Self-pay | Admitting: Family Medicine

## 2023-09-01 DIAGNOSIS — N6489 Other specified disorders of breast: Secondary | ICD-10-CM

## 2023-09-24 ENCOUNTER — Encounter: Payer: Self-pay | Admitting: Gynecologic Oncology

## 2023-09-24 ENCOUNTER — Inpatient Hospital Stay: Payer: No Typology Code available for payment source | Attending: Gynecologic Oncology | Admitting: Gynecologic Oncology

## 2023-09-24 VITALS — BP 126/70 | HR 82 | Temp 97.6°F | Resp 16 | Ht 64.0 in | Wt 210.0 lb

## 2023-09-24 DIAGNOSIS — Z90710 Acquired absence of both cervix and uterus: Secondary | ICD-10-CM | POA: Diagnosis not present

## 2023-09-24 DIAGNOSIS — Z8541 Personal history of malignant neoplasm of cervix uteri: Secondary | ICD-10-CM

## 2023-09-24 DIAGNOSIS — Z90722 Acquired absence of ovaries, bilateral: Secondary | ICD-10-CM | POA: Diagnosis not present

## 2023-09-24 DIAGNOSIS — Z923 Personal history of irradiation: Secondary | ICD-10-CM | POA: Diagnosis not present

## 2023-09-24 DIAGNOSIS — C541 Malignant neoplasm of endometrium: Secondary | ICD-10-CM

## 2023-09-24 DIAGNOSIS — Z9221 Personal history of antineoplastic chemotherapy: Secondary | ICD-10-CM | POA: Insufficient documentation

## 2023-09-24 NOTE — Patient Instructions (Signed)
It was good to see you today.  I do not see or feel any evidence of cancer recurrence on your exam.  I will see you for follow-up in 9 months.  As always, if you develop any new and concerning symptoms before your next visit, please call to see me sooner.

## 2023-09-24 NOTE — Progress Notes (Signed)
Gynecologic Oncology Return Clinic Visit  09/24/23  Reason for Visit: Surveillance visit in the setting of endometrial cancer   Treatment History: Oncology History Overview Note  MSI- H MLH1 promoter hypermethylation is present  MMR IHC MLH1:          LOSS OF NUCLEAR EXPRESSION  MSH2:          Preserved nuclear expression  MSH6:          Preserved nuclear expression  PMS2:          LOSS OF NUCLEAR EXPRESSION   Endometrial cancer (HCC)  09/30/2021 Initial Biopsy   Hysteroscopy D&C, and MyoSure resection of anterior wall endometrial polyp (noted to be incomplete).  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   10/04/2021 Initial Diagnosis   Endometrial cancer (HCC)   10/09/2021 Imaging   CT A/P: IMPRESSION: Endometrial thickening measuring 10 mm, consistent with known history of endometrial carcinoma.   2 cm subserosal fibroid in anterior uterine fundus.   No evidence of metastatic disease within the abdomen or pelvis.   Colonic diverticulosis, without radiographic evidence of diverticulitis.   Mild hepatic steatosis.   Aortic Atherosclerosis   10/10/2021 Surgery   TRH/BSO, SLN biopsy, cysto, repair vaginal laceration  Findings: On EUA, small mobile uterus. On intra-abdminal entry, some adhesions of omentum to the anterior abdominal wall in the mid right abdomen, filmy.  Normal liver, diaphragm, stomach.  Normal small and large bowel.  Uterus approximately 8 cm in size with a 1-2 cm fundal left fibroid.  Ovaries unremarkable although larger than would expect in a postmenopausal patient.  Normal-appearing fallopian tubes although disrupted.  Clip within the pelvis noted on bladder peritoneum.  Mapping successful bilaterally to external iliac sentinel lymph nodes.  No obvious evidence of extrauterine disease.  Right ovary adherent to the posterior uterus and broad ligament, rectum somewhat adherent posteriorly within the cul-de-sac.  Area of  questionable deserosalization of the rectum was oversewn, negative bubble test.  Given significant adhesions of the bladder to the cervix, cystoscopy was performed, bladder and dome intact, good efflux noted from bilateral ureteral orifices.   10/10/2021 Pathology Results   A. SENTINEL LYMPH NODE, RIGHT EXTERNAL ILIAC, BIOPSY:  -  No carcinoma identified in one lymph node (0/1)  -  See comment   B. SENTINEL LYMPH NODE, LEFT EXTERNAL ILIAC, BIOPSY:  -  No carcinoma identified in one lymph node (0/1)  -  See comment   C. UTERUS, CERVIX, BILATERAL TUBES AND OVARIES:  Uterus:  -  Endometrioid carcinoma, FIGO grade 2, largely involving an  endometrial polyp  -  Benign endometrial polyp  -  Leiomyomata (2.2 cm; largest)  -  Adenomyosis  -  See oncology table and comment below   Cervix:  -  No carcinoma identified   Bilateral Ovaries:  -  No carcinoma identified   Bilateral Fallopian tubes:  -  No carcinoma identified   ONCOLOGY TABLE:   UTERUS, CARCINOMA OR CARCINOSARCOMA: Resection   Procedure: Total hysterectomy and bilateral salpingo-oophorectomy  Histologic Type: Endometrioid carcinoma, NOS  Histologic Grade: FIGO grade 2  Myometrial Invasion:       Depth of Myometrial Invasion (mm): 2       Myometrial Thickness (mm): 22       Percentage of Myometrial Invasion: Less than 50%  Uterine Serosa Involvement: Not identified  Cervical stromal Involvement: Not identified  Extent of involvement of other tissue/organs: Not identified  Peritoneal/Ascitic Fluid: Not submitted unknown  Lymphovascular  Invasion: Not identified  Regional Lymph Nodes:       Pelvic Lymph Nodes Examined:  2 Sentinel                                0 non-sentinel                                   2 total       Pelvic Lymph Nodes with Metastasis: 0                           Macrometastasis: (>2.0 mm): 0                          Micrometastasis: (>0.2 mm and < 2.0 mm): 0                             Isolated Tumor Cells (<0.2 mm): 0                       Laterality of Lymph Node with Tumor: N/A                             Extracapsular Extension: N/A       Para-aortic Lymph Nodes Examined: N/A  Distant Metastasis:       Distant Site(s) Involved: N/A  Pathologic Stage Classification (pTNM, AJCC 8th Edition): pT1a, pN0  Ancillary Studies: MMR / MSI testing will be ordered  Representative Tumor Block: C6  Comment(s): See above   12/03/2021 - 01/02/2022 Radiation Therapy   12/03/2021 through 01/02/2022 Site Technique Total Dose (Gy) Dose per Fx (Gy) Completed Fx Beam Energies  Vagina: Pelvis HDR-brachy 30/30 6 5/5 Ir-192        Interval History: Doing well.  Using her vaginal dilator once a week.  Denies any vaginal eating or discharge.  Denies abdominal or pelvic pain.  Reports baseline bowel bladder function.  Past Medical/Surgical History: Past Medical History:  Diagnosis Date   Arthritis    both knees   CKD (chronic kidney disease), stage III (HCC)    no nephrologist   Diabetes mellitus without complication (HCC)    DM2, injection once a week; cbgs normally 110-130   Essential hypertension    Gout    History of radiation therapy    Endometrium- 12/03/21-01/02/22- Dr. Antony Blackbird   Mixed hyperlipidemia    Morbid obesity (HCC)    Pure hypercholesterolemia    uterine ca 09/2021   Vitamin D deficiency    Wears glasses     Past Surgical History:  Procedure Laterality Date   BREAST BIOPSY Right 08/21/2023   MM RT BREAST BX W LOC DEV 1ST LESION IMAGE BX SPEC STEREO GUIDE 08/21/2023 [facility]   CHOLECYSTECTOMY  1989   DILATATION & CURETTAGE/HYSTEROSCOPY WITH MYOSURE N/A 09/30/2021   Procedure: DILATATION & CURETTAGE/HYSTEROSCOPY WITH MYOSURE;  Surgeon: Olivia Mackie, MD;  Location: [facility];  Service: Gynecology;  Laterality: N/A;   EYE SURGERY     cataract surgery, lens implant on right eye 2008 Left eye 2023   LASIK Bilateral 2002   LYMPH  NODE DISSECTION N/A 10/10/2021   Procedure: LYMPH NODE DISSECTION;  Surgeon: Carver Fila, MD;  Location: [facility];  Service: Gynecology;  Laterality: N/A;   ROBOTIC ASSISTED TOTAL HYSTERECTOMY WITH BILATERAL SALPINGO OOPHERECTOMY Bilateral 10/10/2021   Procedure: XI ROBOTIC ASSISTED TOTAL HYSTERECTOMY WITH BILATERAL SALPINGO OOPHORECTOMY;CYSTOSCOPY;  Surgeon: Carver Fila, MD;  Location: [facility];  Service: Gynecology;  Laterality: Bilateral;   SENTINEL NODE BIOPSY N/A 10/10/2021   Procedure: SENTINEL NODE BIOPSY;  Surgeon: Carver Fila, MD;  Location: [facility];  Service: Gynecology;  Laterality: N/A;   TONSILLECTOMY  1953    Family History  Problem Relation Age of Onset   Breast cancer Paternal Aunt    Uterine cancer Paternal Aunt    Lung cancer Maternal Uncle    Colon cancer Neg Hx    Ovarian cancer Neg Hx    Endometrial cancer Neg Hx    Pancreatic cancer Neg Hx    Prostate cancer Neg Hx     Social History   Socioeconomic History   Marital status: Single    Spouse name: Not on file   Number of children: Not on file   Years of education: Not on file   Highest education level: Not on file  Occupational History   Not on file  Tobacco Use   Smoking status: Never   Smokeless tobacco: Never  Vaping Use   Vaping status: Never Used  Substance and Sexual Activity   Alcohol use: Never   Drug use: Never   Sexual activity: Not Currently  Other Topics Concern   Not on file  Social History Narrative   Not on file   Social Drivers of Health   Financial Resource Strain: Not on file  Food Insecurity: Not on file  Transportation Needs: Not on file  Physical Activity: Not on file  Stress: Not on file  Social Connections: Not on file    Current Medications:  Current Outpatient Medications:    acetaminophen (TYLENOL) 325 MG tablet, Take 650 mg by mouth every 6 (six) hours as needed for mild pain., Disp: , Rfl:    amLODipine (NORVASC) 5 MG tablet, TAKE 1 TABLET  BY MOUTH ONCE  DAILY for 90, Disp: , Rfl:    aspirin 81 MG chewable tablet, Chew 81 mg by mouth daily., Disp: , Rfl:    BIOTIN MAXIMUM PO, Take by mouth., Disp: , Rfl:    calcium carbonate (OSCAL) 1500 (600 Ca) MG TABS tablet, Take 1,200 mg by mouth daily., Disp: , Rfl:    Cholecalciferol (VITAMIN D3) 125 MCG (5000 UT) TABS, Take 5,000 Units by mouth daily., Disp: , Rfl:    diclofenac Sodium (VOLTAREN) 1 % GEL, Apply 2 g topically daily as needed (Arthritis). For knees, Disp: , Rfl:    loratadine (CLARITIN) 10 MG tablet, Take 10 mg by mouth daily as needed for allergies or rhinitis., Disp: , Rfl:    Menthol, Topical Analgesic, (BIOFREEZE ROLL-ON EX), Apply 1 application topically daily as needed (Back pain)., Disp: , Rfl:    naphazoline-pheniramine (VISINE) 0.025-0.3 % ophthalmic solution, 1 drop daily as needed (Dry eye)., Disp: , Rfl:    rosuvastatin (CRESTOR) 5 MG tablet, Take 5 mg by mouth at bedtime., Disp: , Rfl:    Semaglutide, 1 MG/DOSE, (OZEMPIC, 1 MG/DOSE,) 2 MG/1.5ML SOPN, Inject 1 mg into the skin once a week. Injection on Sundays., Disp: , Rfl:    sodium chloride (OCEAN) 0.65 % SOLN nasal spray, Place 1 spray into both nostrils as needed for congestion., Disp: , Rfl:    telmisartan (MICARDIS) 40  MG tablet, Take 40 mg by mouth daily., Disp: , Rfl:    UNABLE TO FIND, Apply 1 application topically daily as needed (Leg cramps). [facility] OTC for leg cramps, Disp: , Rfl:    zinc gluconate 50 MG tablet, Take 25 mg by mouth daily., Disp: , Rfl:   Review of Systems: Denies appetite changes, fevers, chills, fatigue, unexplained weight changes. Denies hearing loss, neck lumps or masses, mouth sores, ringing in ears or voice changes. Denies cough or wheezing.  Denies shortness of breath. Denies chest pain or palpitations. Denies leg swelling. Denies abdominal distention, pain, blood in stools, constipation, diarrhea, nausea, vomiting, or early satiety. Denies pain with intercourse, dysuria,  frequency, hematuria or incontinence. Denies hot flashes, pelvic pain, vaginal bleeding or vaginal discharge.   Denies joint pain, back pain or muscle pain/cramps. Denies itching, rash, or wounds. Denies dizziness, headaches, numbness or seizures. Denies swollen lymph nodes or glands, denies easy bruising or bleeding. Denies anxiety, depression, confusion, or decreased concentration.  Physical Exam: BP (!) 155/99 (BP Location: Right Arm, Patient Position: Sitting) Comment: 1st 177/100 recheck  Pulse 82   Temp 97.6 F (36.4 C) (Oral)   Resp 16   Ht 5\' 4"  (1.626 m)   Wt 210 lb (95.3 kg)   BMI 36.05 kg/m  General: Alert, oriented, no acute distress. HEENT: Normocephalic, atraumatic, sclera anicteric. Chest: Clear to auscultation bilaterally.  No wheezes or rhonchi. Cardiovascular: Regular rate and rhythm, no murmurs. Abdomen: Obese, soft, nontender.  Normoactive bowel sounds.  No masses or hepatosplenomegaly appreciated.  Well-healed incisions. Extremities: Grossly normal range of motion.  Warm, well perfused.  No edema bilaterally. Skin: No rashes or lesions noted. Lymphatics: No cervical, supraclavicular, or inguinal adenopathy. GU: Normal appearing external genitalia without erythema, excoriation, or lesions.  Speculum exam reveals mildly atrophic vaginal mucosa as well as radiation changes present at the cuff.  Bimanual exam reveals cuff intact, no nodularity or masses.  Minimal narrowing of the vagina at the apex, unchanged.  Rectovaginal exam confirms findings.  Laboratory & Radiologic Studies: None new  Assessment & Plan: Becky Dudley is a 72 y.o. 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 MSI-H. MLH1 hypermethylation present.    Patient is doing very well.  She is NED on exam today.  The patient is using her vaginal dilator once a week.   We reviewed  signs and symptoms that would be concerning for disease recurrence, and I stressed the importance of calling if she develops any of these between visits.  Per NCCN surveillance recommendations, we will transition to surveillance visits every 6 months after her next visit with Dr. Ocie Cornfield in March 2025. We will continue alternating visits between my office and radiation oncology.  She will see me in September 2025.  21 minutes of total time was spent for this patient encounter, including preparation, face-to-face counseling with the patient and coordination of care, and documentation of the encounter.  Eugene Garnet, MD  Division of Gynecologic Oncology  Department of Obstetrics and Gynecology  [facility]

## 2023-10-30 IMAGING — DX DG FEMUR 2+V*L*
4 series · 4 of 4 positions shown · non-contrast
Comparison: None Available.

CLINICAL DATA: Left leg pain. Patient reports burning pain with
certain movements. No known injury.

EXAM:
LEFT FEMUR 2 VIEWS

[dg femur min 2 views left (1 of 4)]
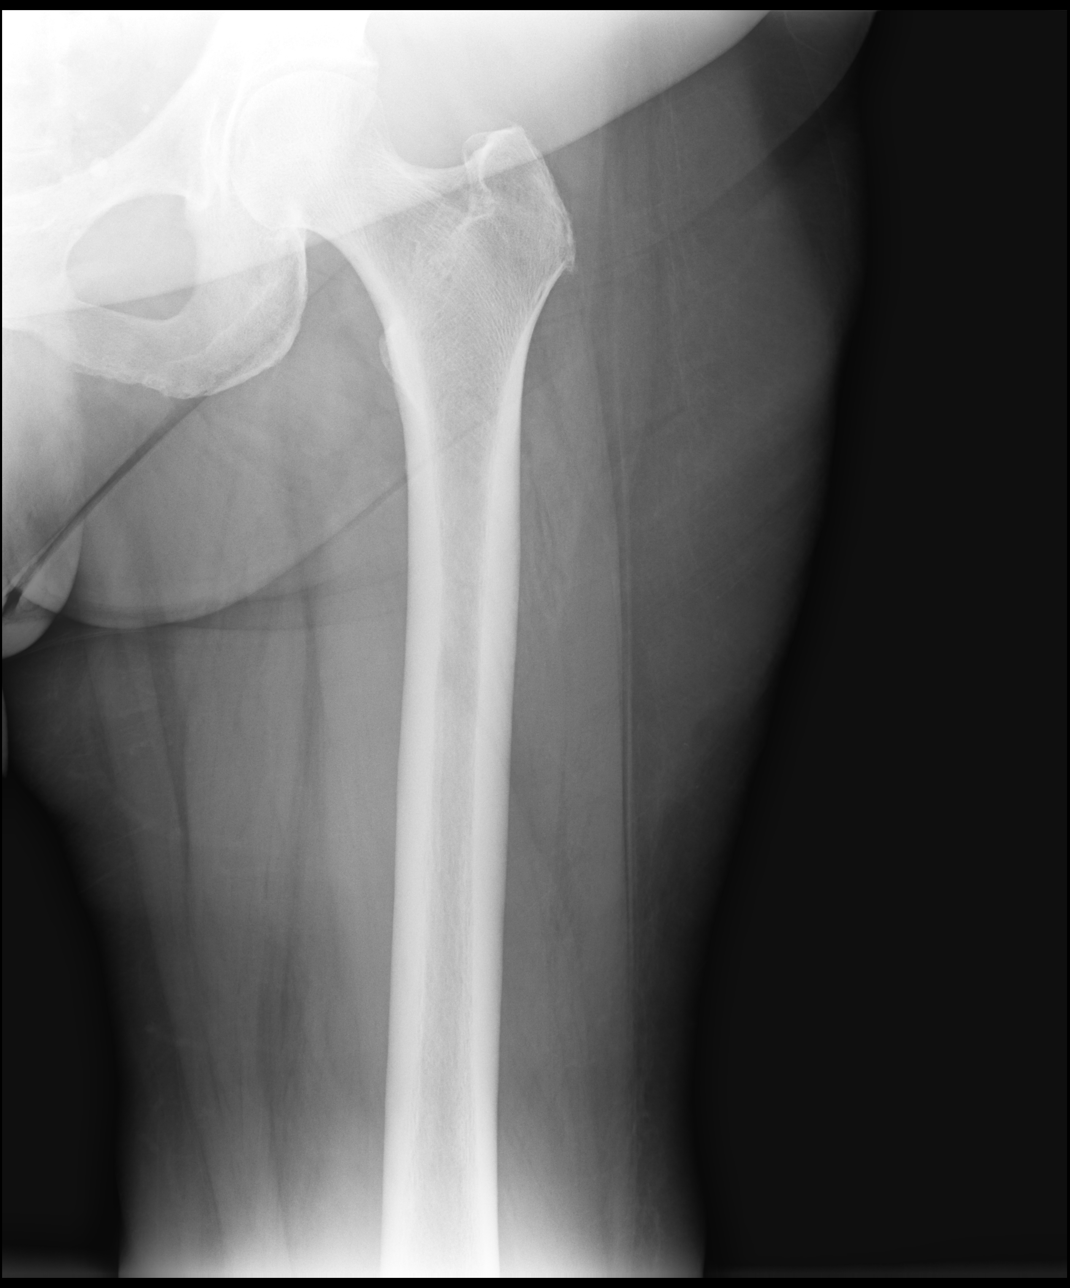

[dg femur min 2 views left (2 of 4)]
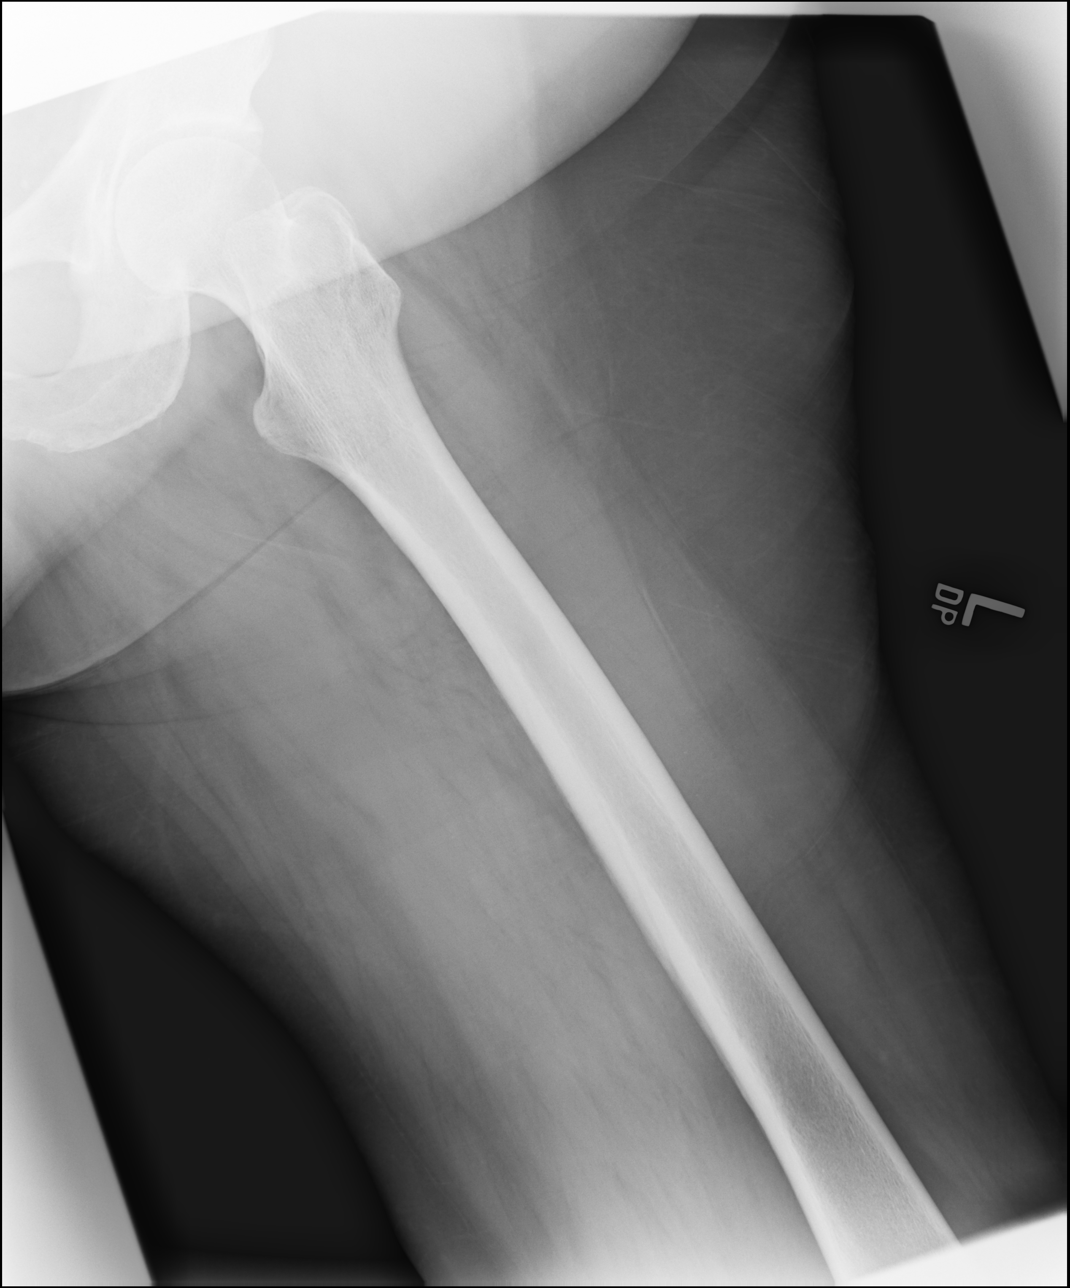

[dg femur min 2 views left (3 of 4)]
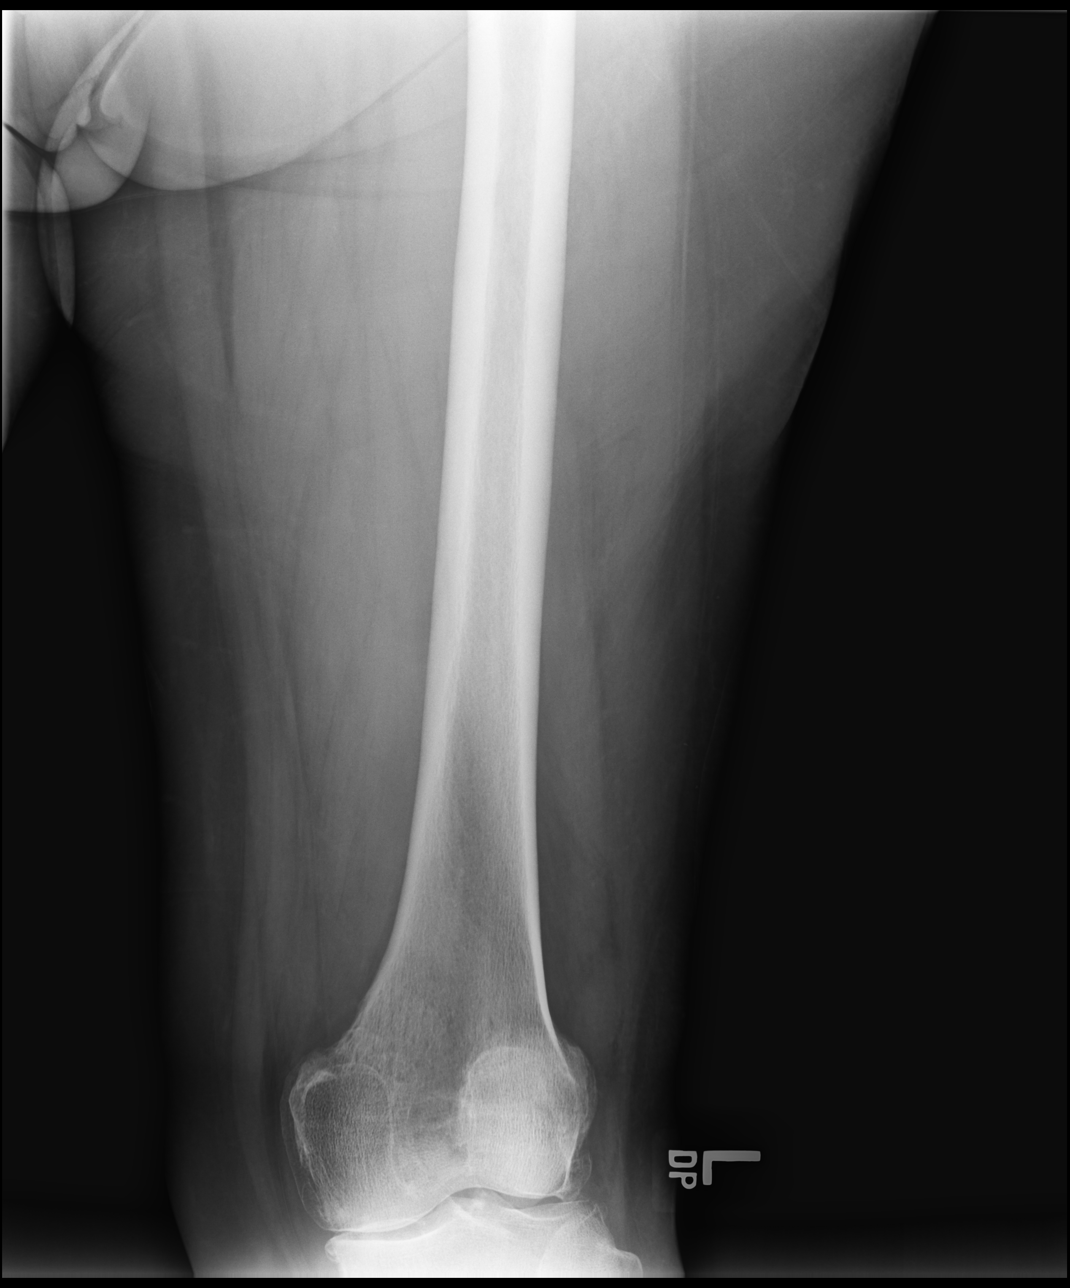

[dg femur min 2 views left (4 of 4)]
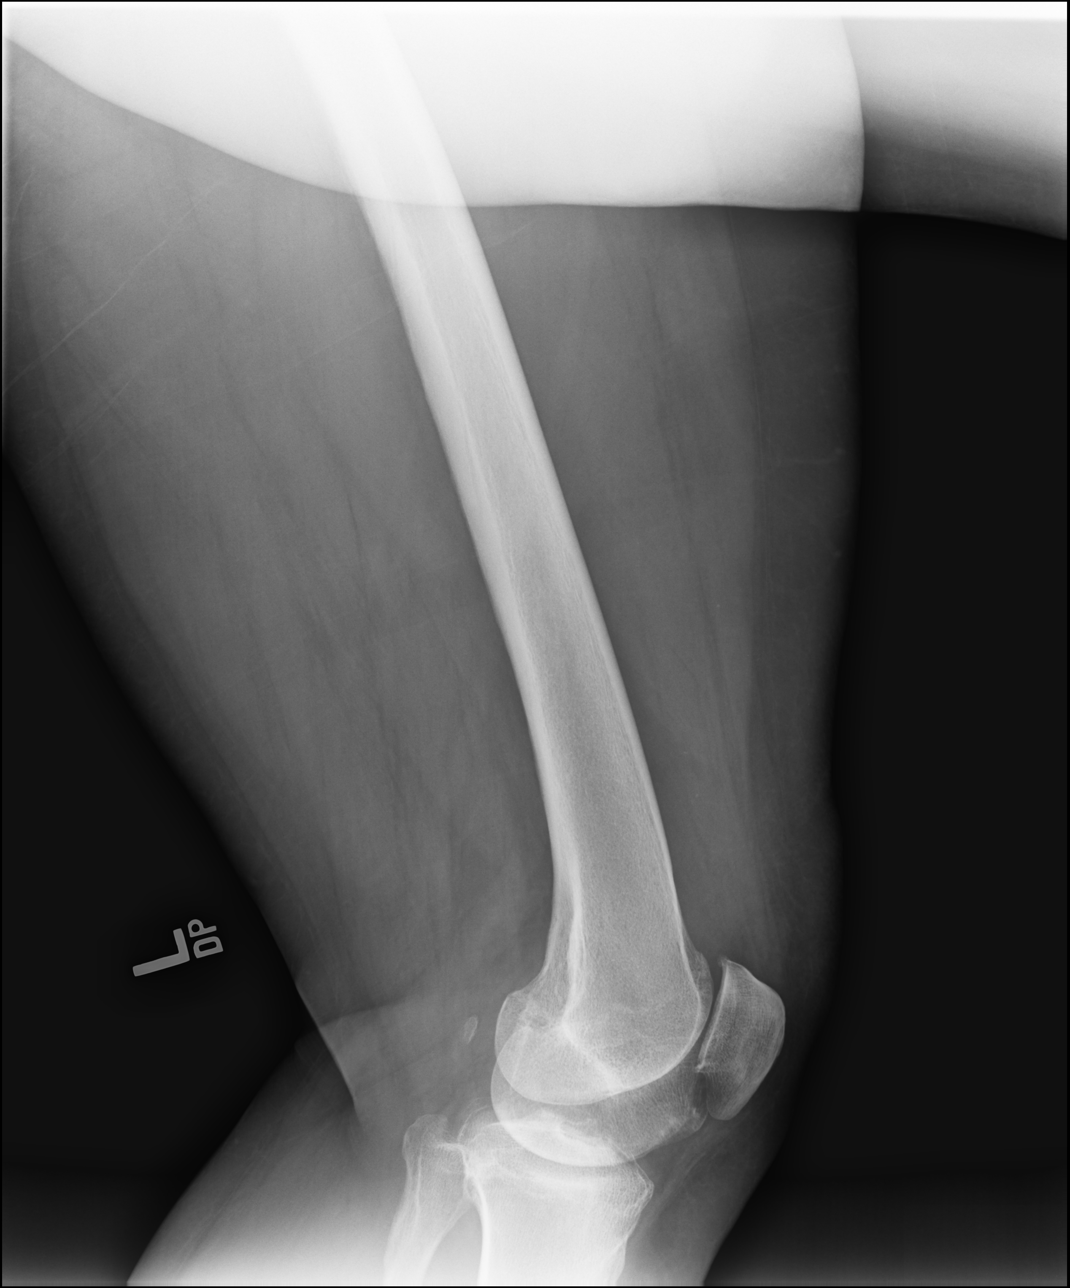

[4 of 4 positions shown; findings below may reference images not displayed]

FINDINGS: The cortical margins of the femur are intact. There is no evidence
of fracture or other focal bone lesions. Hip and knee alignment are
maintained. Moderate knee osteoarthritis. Soft tissues are
unremarkable.
IMPRESSION: Unremarkable radiographic appearance of the femur. Moderate knee
osteoarthritis, only partially evaluated on this femur exam.

## 2023-12-27 NOTE — Progress Notes (Signed)
 Radiation Oncology         (336) [phone] ________________________________  Name: Becky Dudley MRN: [id number]  Date: 12/28/2023  DOB: 01/18/1951  Follow-Up Visit Note  CC: Shon Hale, MD  Carver Fila, MD  No diagnosis found.  Diagnosis: FIGO grade 2 endometrioid carcinoma; s/p hysterectomy and BSO, Stage pT1a, pN0   Interval Since Last Radiation: Approximately 2 years   Intent: Curative  Radiation Treatment Dates: 12/03/2021 through 01/02/2022 Site Technique Total Dose (Gy) Dose per Fx (Gy) Completed Fx Beam Energies  Vagina: Pelvis HDR-brachy 30/30 6 5/5 Ir-192    Narrative:  The patient returns today for routine follow-up. She was last seen here for follow-up on 06/29/23.   She most recently followed up with Dr. Pricilla Holm on 09/24/23 and was doing well and NED on examination at that time.   No other significant oncologic interval history since the patient was last seen for follow-up.   Of note: She presented for a bilateral diagnostic mammogram and right breast ultrasound on 08/19/23, for follow-up of likely benign calcifications in the right breast, which demonstrated: an architectural distortion involving the upper inner right breast at a posterior depth without a sonographic correlate, and stability of the loosely grouped calcifications in the inner right breast at middle depth (dating back to November, 2022); confirming a benign etiology. Imaging otherwise showed no evidence of right axillary lymphadenopathy and no evidence of malignancy in the left breast.   She ultimately underwent a biopsy of the architectural distortion in the upper inner right breast on 08/21/23. Pathology showed no evidence of malignancy and findings consistent with stromal fibrosis.   She is due for her next routine diagnostic breast study this coming May.   ***                            Allergies:  is allergic to lisinopril.  Meds: Current Outpatient Medications   Medication Sig Dispense Refill   acetaminophen (TYLENOL) 325 MG tablet Take 650 mg by mouth every 6 (six) hours as needed for mild pain.     amLODipine (NORVASC) 5 MG tablet TAKE 1 TABLET BY MOUTH ONCE  DAILY for 90     aspirin 81 MG chewable tablet Chew 81 mg by mouth daily.     BIOTIN MAXIMUM PO Take by mouth.     calcium carbonate (OSCAL) 1500 (600 Ca) MG TABS tablet Take 1,200 mg by mouth daily.     Cholecalciferol (VITAMIN D3) 125 MCG (5000 UT) TABS Take 5,000 Units by mouth daily.     diclofenac Sodium (VOLTAREN) 1 % GEL Apply 2 g topically daily as needed (Arthritis). For knees     loratadine (CLARITIN) 10 MG tablet Take 10 mg by mouth daily as needed for allergies or rhinitis.     Menthol, Topical Analgesic, (BIOFREEZE ROLL-ON EX) Apply 1 application topically daily as needed (Back pain).     naphazoline-pheniramine (VISINE) 0.025-0.3 % ophthalmic solution 1 drop daily as needed (Dry eye).     rosuvastatin (CRESTOR) 5 MG tablet Take 5 mg by mouth at bedtime.     Semaglutide, 1 MG/DOSE, (OZEMPIC, 1 MG/DOSE,) 2 MG/1.5ML SOPN Inject 1 mg into the skin once a week. Injection on Sundays.     sodium chloride (OCEAN) 0.65 % SOLN nasal spray Place 1 spray into both nostrils as needed for congestion.     telmisartan (MICARDIS) 40 MG tablet Take 40 mg by mouth daily.  UNABLE TO FIND Apply 1 application topically daily as needed (Leg cramps). [facility] OTC for leg cramps     zinc gluconate 50 MG tablet Take 25 mg by mouth daily.     No current facility-administered medications for this encounter.    Physical Findings: The patient is in no acute distress. Patient is alert and oriented.  vitals were not taken for this visit. .  No significant changes. Lungs are clear to auscultation bilaterally. Heart has regular rate and rhythm. No palpable cervical, supraclavicular, or axillary adenopathy. Abdomen soft, non-tender, normal bowel sounds.  On pelvic examination the external genitalia were  unremarkable. A speculum exam was performed. There are no mucosal lesions noted in the vaginal vault. A Pap smear was obtained of the proximal vagina. On bimanual and rectovaginal examination there were no pelvic masses appreciated. ***   Lab Findings: Lab Results  Component Value Date   WBC 7.6 10/09/2021   HGB 13.3 10/09/2021   HCT 40.0 10/09/2021   MCV 91.3 10/09/2021   PLT 275 10/09/2021    Radiographic Findings: No results found.  Impression:  FIGO grade 2 endometrioid carcinoma; s/p hysterectomy and BSO, Stage pT1a, pN0   The patient is recovering from the effects of radiation.  ***  Plan:  ***   *** minutes of total time was spent for this patient encounter, including preparation, face-to-face counseling with the patient and coordination of care, physical exam, and documentation of the encounter. ____________________________________  Billie Lade, PhD, MD  This document serves as a record of services personally performed by Antony Blackbird, MD. It was created on his behalf by Neena Rhymes, a trained medical scribe. The creation of this record is based on the scribe's personal observations and the provider's statements to them. This document has been checked and approved by the attending provider.

## 2023-12-28 ENCOUNTER — Encounter: Payer: Self-pay | Admitting: Radiation Oncology

## 2023-12-28 ENCOUNTER — Ambulatory Visit
Admission: RE | Admit: 2023-12-28 | Discharge: 2023-12-28 | Disposition: A | Discharge status: Home / Self Care | Payer: Self-pay | Source: Ambulatory Visit | Attending: Radiation Oncology | Admitting: Radiation Oncology

## 2023-12-28 VITALS — BP 144/85 | HR 82 | Temp 97.6°F | Resp 18 | Ht 64.0 in | Wt 209.2 lb

## 2023-12-28 DIAGNOSIS — Z8542 Personal history of malignant neoplasm of other parts of uterus: Secondary | ICD-10-CM | POA: Insufficient documentation

## 2023-12-28 DIAGNOSIS — C541 Malignant neoplasm of endometrium: Secondary | ICD-10-CM

## 2023-12-28 DIAGNOSIS — Z79899 Other long term (current) drug therapy: Secondary | ICD-10-CM | POA: Diagnosis not present

## 2023-12-28 DIAGNOSIS — Z7982 Long term (current) use of aspirin: Secondary | ICD-10-CM | POA: Insufficient documentation

## 2023-12-28 DIAGNOSIS — Z923 Personal history of irradiation: Secondary | ICD-10-CM | POA: Diagnosis not present

## 2023-12-28 NOTE — Progress Notes (Signed)
 Becky Dudley is here today for follow up post radiation to the pelvis.  They completed their radiation on: 01/02/22   Does the patient complain of any of the following:  Pain: No Abdominal bloating: Occasionally  Diarrhea/Constipation: Loose stools, patient thinks this is related to ozempic.  Nausea/Vomiting: No Vaginal Discharge: No Blood in Urine or Stool: No Urinary Issues (dysuria/incomplete emptying/ incontinence/ increased frequency/urgency): No Does patient report using vaginal dilator 2-3 times a week and/or sexually active 2-3 weeks:  Patient using dilators a few times a month.  Post radiation skin changes: No   Additional comments if applicable:  BP (!) 144/85 (BP Location: Right Arm, Patient Position: Sitting, Cuff Size: Large)   Pulse 82   Temp 97.6 F (36.4 C)   Resp 18   Ht 5\' 4"  (1.626 m)   Wt 209 lb 3.2 oz (94.9 kg)   SpO2 98%   BMI 35.91 kg/m

## 2024-01-12 DIAGNOSIS — C50919 Malignant neoplasm of unspecified site of unspecified female breast: Secondary | ICD-10-CM

## 2024-01-12 HISTORY — DX: Malignant neoplasm of unspecified site of unspecified female breast: C50.919

## 2024-02-19 ENCOUNTER — Other Ambulatory Visit: Payer: Self-pay | Admitting: Family Medicine

## 2024-02-19 ENCOUNTER — Ambulatory Visit
Admission: RE | Admit: 2024-02-19 | Discharge: 2024-02-19 | Disposition: A | Discharge status: Home / Self Care | Payer: No Typology Code available for payment source | Source: Ambulatory Visit | Attending: Family Medicine

## 2024-02-19 ENCOUNTER — Ambulatory Visit
Admission: RE | Admit: 2024-02-19 | Discharge: 2024-02-19 | Disposition: A | Discharge status: Home / Self Care | Payer: No Typology Code available for payment source | Source: Ambulatory Visit | Attending: Family Medicine | Admitting: Family Medicine

## 2024-02-19 DIAGNOSIS — N6489 Other specified disorders of breast: Secondary | ICD-10-CM

## 2024-02-24 ENCOUNTER — Ambulatory Visit
Admission: RE | Admit: 2024-02-24 | Discharge: 2024-02-24 | Disposition: A | Discharge status: Home / Self Care | Source: Ambulatory Visit | Attending: Family Medicine | Admitting: Family Medicine

## 2024-02-24 DIAGNOSIS — N6489 Other specified disorders of breast: Secondary | ICD-10-CM

## 2024-02-24 HISTORY — PX: BREAST BIOPSY: SHX20

## 2024-02-25 LAB — SURGICAL PATHOLOGY

## 2024-03-01 ENCOUNTER — Other Ambulatory Visit: Payer: Self-pay | Admitting: General Surgery

## 2024-03-01 DIAGNOSIS — C50211 Malignant neoplasm of upper-inner quadrant of right female breast: Secondary | ICD-10-CM

## 2024-03-03 ENCOUNTER — Other Ambulatory Visit: Payer: Self-pay | Admitting: General Surgery

## 2024-03-03 ENCOUNTER — Encounter: Payer: Self-pay | Admitting: *Deleted

## 2024-03-03 DIAGNOSIS — C50211 Malignant neoplasm of upper-inner quadrant of right female breast: Secondary | ICD-10-CM

## 2024-03-04 ENCOUNTER — Encounter (HOSPITAL_BASED_OUTPATIENT_CLINIC_OR_DEPARTMENT_OTHER): Payer: Self-pay | Admitting: General Surgery

## 2024-03-04 ENCOUNTER — Other Ambulatory Visit: Payer: Self-pay

## 2024-03-08 ENCOUNTER — Encounter (HOSPITAL_BASED_OUTPATIENT_CLINIC_OR_DEPARTMENT_OTHER)
Admission: RE | Admit: 2024-03-08 | Discharge: 2024-03-08 | Disposition: A | Discharge status: Home / Self Care | Source: Ambulatory Visit | Attending: General Surgery | Admitting: General Surgery

## 2024-03-08 DIAGNOSIS — Z01818 Encounter for other preprocedural examination: Secondary | ICD-10-CM | POA: Diagnosis present

## 2024-03-08 DIAGNOSIS — E119 Type 2 diabetes mellitus without complications: Secondary | ICD-10-CM | POA: Insufficient documentation

## 2024-03-08 LAB — BASIC METABOLIC PANEL WITH GFR
Anion gap: 8 (ref 5–15)
BUN: 15 mg/dL (ref 8–23)
CO2: 25 mmol/L (ref 22–32)
Calcium: 8.9 mg/dL (ref 8.9–10.3)
Chloride: 110 mmol/L (ref 98–111)
Creatinine, Ser: 1.09 mg/dL — ABNORMAL HIGH (ref 0.44–1.00)
GFR, Estimated: 53 mL/min — ABNORMAL LOW (ref >60)
Glucose, Bld: 119 mg/dL — ABNORMAL HIGH (ref 70–99)
Potassium: 4.1 mmol/L (ref 3.5–5.1)
Sodium: 143 mmol/L (ref 135–145)

## 2024-03-08 NOTE — Progress Notes (Incomplete)
 Location of Breast Cancer:right  Upper-inner  Histology per Pathology Report:    Receptor Status: ER(+), PR (+), Her2-neu (-), Ki-67()  Did patient present with symptoms (if so, please note symptoms) or was this found on screening mammography?: Mammogram   Past/Anticipated interventions by surgeon, if LOV:FIEPP breast lumpectomy   Past/Anticipated interventions by medical oncology, if any: Patient to see Dr. Lee Public on 03/22/24  Lymphedema issues, if any:  {:18581} {t:21944}   Pain issues, if any:  {:18581} {PAIN DESCRIPTION:21022940}  SAFETY ISSUES: Prior radiation? yes Pacemaker/ICD? {:18581} Possible current pregnancy?{:18581} Is the patient on methotrexate? {:18581}  Current Complaints / other details:  ***

## 2024-03-08 NOTE — Progress Notes (Signed)

## 2024-03-08 NOTE — Progress Notes (Signed)
 Radiation Oncology         [phone] ________________________________  Initial Out patient Re-Consultation  Name: Becky Dudley MRN: [id number]  Date: 03/09/2024  DOB: 05/07/1951  EX:[id number], Ancel Kass, MD  Lockie Rima, MD   REFERRING PHYSICIAN: Lockie Rima, MD  DIAGNOSIS: There were no encounter diagnoses.  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 female who is accompanied by ***. she is seen as a courtesy of Dr. Lockie Rima for an opinion concerning radiation therapy as part of management for her recently diagnosed breast cancer. Patient has a history of uterine cancer treated with hysterectomy and radiation therapy, with no indication of recurrence. She was last seen in office on 12/28/23 for a routine follow up.   Patient has a family history of breast cancer, with two sisters being diagnosed, however she's unsure if genetic testing was performed during her uterine cancer treatment.    The patient presented for a bilateral diagnostic mammogram on 02/19/24 showing ersistent distortion in the upper inner quadrant of the right breast. Of note, patient was previously followed by short interval follow-up for asymmetry in the right upper inner breast. A biopsy in November showed stromal fibrosis but this was felt to potentially be discordant. Only 1 tissue sample had been obtained due to failure of the biopsy device. Repeat examination was performed in May and repeat mammogram  (most recent) was suspicious prompting further investigation.        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 Prognostic indicators significant for: estrogen receptor 90%,  positive, strong staining intensity ; progesterone receptor 100%, positive, strong staining intensity ; Her2 status negative. Proliferation Marker Ki67:  5%      Subsequently, she was referred to Dr. Cherlynn Cornfield on 03/01/24 during which she opted to proceed with a right breast lumpectomy with radioactive seed localization which is scheduled for 03/14/24.    PREVIOUS RADIATION THERAPY: Yes *** 12/03/2021 through 01/02/2022 Site Technique Total Dose (Gy) Dose per Fx (Gy) Completed Fx Beam Energies  Vagina: Pelvis HDR-brachy 30/30 6 5/5 Ir-192   PAST MEDICAL HISTORY:  Past Medical History:  Diagnosis Date   Arthritis    both knees   Breast CA (HCC) 01/2024   right breast DCIS   CKD (chronic kidney disease), stage III (HCC)    no nephrologist   Complication of anesthesia    vertigo after TAH   Diabetes mellitus without complication (HCC)    DM2, injection once a week; cbgs normally 110-130   Essential hypertension    Gout    History of radiation therapy    Endometrium- 12/03/21-01/02/22- Dr. Retta Caster   Mixed hyperlipidemia    Morbid obesity (HCC)    Pure hypercholesterolemia    uterine ca 09/2021   Vitamin D deficiency    Wears glasses     PAST SURGICAL HISTORY: Past Surgical History:  Procedure Laterality Date   BREAST BIOPSY Right 08/21/2023   MM RT BREAST BX W LOC DEV 1ST LESION IMAGE BX SPEC STEREO GUIDE 08/21/2023 [facility]   BREAST BIOPSY Right 02/24/2024   MM RT BREAST BX W LOC DEV 1ST LESION IMAGE BX SPEC STEREO GUIDE 02/24/2024  [facility]   CHOLECYSTECTOMY  1989   DILATATION & CURETTAGE/HYSTEROSCOPY WITH MYOSURE N/A 09/30/2021   Procedure: DILATATION & CURETTAGE/HYSTEROSCOPY WITH MYOSURE;  Surgeon: Meriam Stamp, MD;  Location: [facility];  Service: Gynecology;  Laterality: N/A;   EYE SURGERY     cataract surgery, lens implant on right eye 2008 Left eye 2023   LASIK Bilateral 2002   LYMPH NODE DISSECTION N/A 10/10/2021   Procedure: LYMPH  NODE DISSECTION;  Surgeon: Suzi Essex, MD;  Location: [facility];  Service: Gynecology;  Laterality: N/A;   ROBOTIC ASSISTED TOTAL HYSTERECTOMY WITH BILATERAL SALPINGO OOPHERECTOMY Bilateral 10/10/2021   Procedure: XI ROBOTIC ASSISTED TOTAL HYSTERECTOMY WITH BILATERAL SALPINGO OOPHORECTOMY;CYSTOSCOPY;  Surgeon: Suzi Essex, MD;  Location: [facility];  Service: Gynecology;  Laterality: Bilateral;   SENTINEL NODE BIOPSY N/A 10/10/2021   Procedure: SENTINEL NODE BIOPSY;  Surgeon: Suzi Essex, MD;  Location: [facility];  Service: Gynecology;  Laterality: N/A;   TONSILLECTOMY  1953    FAMILY HISTORY:  Family History  Problem Relation Age of Onset   Breast cancer Paternal Aunt    Uterine cancer Paternal Aunt    Lung cancer Maternal Uncle    Colon cancer Neg Hx    Ovarian cancer Neg Hx    Endometrial cancer Neg Hx    Pancreatic cancer Neg Hx    Prostate cancer Neg Hx     SOCIAL HISTORY:  Social History   Tobacco Use   Smoking status: Never   Smokeless tobacco: Never  Vaping Use   Vaping status: Never Used  Substance Use Topics   Alcohol use: Never   Drug use: Never    ALLERGIES:  Allergies  Allergen Reactions   Lisinopril Cough    MEDICATIONS:  Current Outpatient Medications  Medication Sig Dispense Refill   acetaminophen  (TYLENOL ) 325 MG tablet Take 650 mg by mouth every 6 (six) hours as needed for mild pain.     amLODipine (NORVASC) 5 MG tablet TAKE 1 TABLET BY MOUTH ONCE  DAILY for 90     aspirin 81 MG chewable tablet Chew 81 mg by mouth daily.     BIOTIN MAXIMUM PO Take by mouth.     calcium carbonate (OSCAL) 1500 (600 Ca) MG TABS tablet Take 1,200 mg by mouth daily.     Cholecalciferol (VITAMIN D3) 125 MCG (5000 UT) TABS Take 5,000 Units by mouth daily.     diclofenac Sodium (VOLTAREN) 1 % GEL Apply 2 g topically daily as needed (Arthritis). For knees     loratadine (CLARITIN) 10 MG tablet Take 10 mg by mouth daily as needed for allergies or rhinitis.      Menthol, Topical Analgesic, (BIOFREEZE ROLL-ON EX) Apply 1 application topically daily as needed (Back pain).     naphazoline-pheniramine (VISINE) 0.025-0.3 % ophthalmic solution 1 drop daily as needed (Dry eye).     rosuvastatin (CRESTOR) 5 MG tablet Take 5 mg by mouth at bedtime.     Semaglutide, 1 MG/DOSE, (OZEMPIC, 1 MG/DOSE,) 2 MG/1.5ML SOPN Inject 1 mg into the skin once a week. Injection on Sundays.     sodium chloride  (OCEAN) 0.65 % SOLN nasal spray Place 1 spray into both nostrils as needed for congestion.     telmisartan (MICARDIS) 40 MG tablet Take 40 mg by mouth daily.     UNABLE TO FIND Apply 1 application topically daily as needed (Leg cramps). [employer] OTC for leg cramps     zinc gluconate 50 MG tablet Take 25  mg by mouth daily.     No current facility-administered medications for this encounter.    REVIEW OF SYSTEMS:  A 10+ POINT REVIEW OF SYSTEMS WAS OBTAINED including neurology, dermatology, psychiatry, cardiac, respiratory, lymph, extremities, GI, GU, musculoskeletal, constitutional, reproductive, HEENT. ***   PHYSICAL EXAM:  vitals were not taken for this visit.   General: Alert and oriented, in no acute distress HEENT: Head is normocephalic. Extraocular movements are intact. Oropharynx is clear. Neck: Neck is supple, no palpable cervical or supraclavicular lymphadenopathy. Heart: Regular in rate and rhythm with no murmurs, rubs, or gallops. Chest: Clear to auscultation bilaterally, with no rhonchi, wheezes, or rales. Abdomen: Soft, nontender, nondistended, with no rigidity or guarding. Extremities: No cyanosis or edema. Lymphatics: see Neck Exam Skin: No concerning lesions. Musculoskeletal: symmetric strength and muscle tone throughout. Neurologic: Cranial nerves II through XII are grossly intact. No obvious focalities. Speech is fluent. Coordination is intact. Psychiatric: Judgment and insight are intact. Affect is appropriate. ***  ECOG = ***  0 - Asymptomatic  (Fully active, able to carry on all predisease activities without restriction)  1 - Symptomatic but completely ambulatory (Restricted in physically strenuous activity but ambulatory and able to carry out work of a light or sedentary nature. For example, light housework, office work)  2 - Symptomatic, <50% in bed during the day (Ambulatory and capable of all self care but unable to carry out any work activities. Up and about more than 50% of waking hours)  3 - Symptomatic, >50% in bed, but not bedbound (Capable of only limited self-care, confined to bed or chair 50% or more of waking hours)  4 - Bedbound (Completely disabled. Cannot carry on any self-care. Totally confined to bed or chair)  5 - Death   Aurea Blossom MM, Creech RH, Tormey DC, et al. [phone]). "Toxicity and response criteria of the [facility]". Am. Hillard Lowes. Oncol. 5 (6): 649-55  LABORATORY DATA:  Lab Results  Component Value Date   WBC 7.6 10/09/2021   HGB 13.3 10/09/2021   HCT 40.0 10/09/2021   MCV 91.3 10/09/2021   PLT 275 10/09/2021   Lab Results  Component Value Date   NA 144 10/16/2021   K 4.1 10/16/2021   CL 110 10/16/2021   CO2 29 10/16/2021   GLUCOSE 100 (H) 10/16/2021   BUN 16 10/16/2021   CREATININE 1.20 (H) 10/16/2021   CALCIUM 9.4 10/16/2021      RADIOGRAPHY: MM RT BREAST BX W LOC DEV 1ST LESION IMAGE BX SPEC STEREO GUIDE Addendum Date: 02/25/2024 ADDENDUM REPORT: 02/25/2024 14:18 ADDENDUM: PATHOLOGY revealed: 1. Breast, right, needle core biopsy, Upper inner quadrant, (ribbon clip) - DUCTAL CARCINOMA IN SITU, INTERMEDIATE GRADE - NECROSIS: PRESENT - CALCIFICATIONS: NOT IDENTIFIED - DCIS LENGTH: 0.5 CM Pathology results are CONCORDANT with imaging findings, per Dr. Luann Rundle Mir. Pathology results and recommendations were discussed with patient via telephone on 02/25/2024 by Ladonna Pickup RN. Patient reported biopsy site doing well with no adverse symptoms, and only slight tenderness at the site.  Post biopsy care instructions were reviewed, questions were answered and my direct phone number was provided. Patient was instructed to call [facility] for any additional questions or concerns related to biopsy site. RECOMMENDATION: 1. Surgical and oncological consultation. Request for surgical consultation was relayed to Alphonse Asal at [facility] on 02/25/2024 by Ladonna Pickup RN. Pathology results reported by Ladonna Pickup RN on 02/25/2024. Electronically Signed   By: Grayland Le.D.  On: 02/25/2024 14:18   Result Date: 02/25/2024 CLINICAL DATA:  73 year old woman with architectural distortion of the RIGHT breast underwent stereotactic guided biopsy on 08/21/2023. Due to machine malfunction, only 1 sample could be obtained, which showed stromal fibrosis. Most recent follow-up mammogram on 02/19/2024 shows persistent architectural distortion. She returns today for repeat biopsy of the upper inner quadrant architectural distortion. EXAM: RIGHT BREAST STEREOTACTIC CORE NEEDLE BIOPSY COMPARISON:  Previous exam(s). FINDINGS: The patient and I discussed the procedure of stereotactic-guided biopsy including benefits and alternatives. We discussed the high likelihood of a successful procedure. We discussed the risks of the procedure including infection, bleeding, tissue injury, clip migration, and inadequate sampling. Informed written consent was given. The usual time out protocol was performed immediately prior to the procedure. Using sterile technique and 1% Lidocaine  as local anesthetic, under stereotactic guidance, a 9 gauge vacuum assisted device was used to perform core needle biopsy of the architectural distortion in the upper inner RIGHT breast using a superior approach. Lesion quadrant: Upper inner quadrant At the conclusion of the procedure, ribbon shaped tissue marker clip was deployed into the biopsy cavity. Follow-up 2-view mammogram was performed and dictated  separately. IMPRESSION: Stereotactic-guided biopsy of RIGHT upper inner quadrant architectural distortion. No apparent complications. Electronically Signed: By: Elester Grim M.D. On: 02/24/2024 12:45   MM CLIP PLACEMENT RIGHT Result Date: 02/24/2024 CLINICAL DATA:  Status post stereotactic guided biopsy of RIGHT upper inner quadrant architectural distortion. EXAM: 3D DIAGNOSTIC RIGHT MAMMOGRAM POST STEREOTACTIC BIOPSY COMPARISON:  Previous exam(s). ACR Breast Density Category c: The breasts are heterogeneously dense, which may obscure small masses. FINDINGS: 3D Mammographic images were obtained following stereotactic guided biopsy of RIGHT upper inner quadrant architectural distortion. The biopsy marking clip is in expected position at the site of biopsy. IMPRESSION: Appropriate positioning of the ribbon shaped biopsy marking clip at the site of biopsy in the upper inner quadrant of the RIGHT breast. Final Assessment: Post Procedure Mammograms for Marker Placement Electronically Signed   By: Elester Grim M.D.   On: 02/24/2024 12:47   MM 3D DIAGNOSTIC MAMMOGRAM UNILATERAL RIGHT BREAST Result Date: 02/19/2024 CLINICAL DATA:  Short-term interval follow-up of the right breast. Patient underwent a stereotactic biopsy on 08/21/2023 demonstrating stromal fibrosis. Only 1 tissue sample was obtained secondary to failure of the biopsy device. EXAM: DIGITAL DIAGNOSTIC UNILATERAL RIGHT MAMMOGRAM WITH TOMOSYNTHESIS AND CAD; ULTRASOUND RIGHT BREAST LIMITED TECHNIQUE: Right digital diagnostic mammography and breast tomosynthesis was performed. The images were evaluated with computer-aided detection. ; Targeted ultrasound examination of the right breast was performed COMPARISON:  Previous exam(s). ACR Breast Density Category c: The breasts are heterogeneously dense, which may obscure small masses. FINDINGS: There is a coil shaped clip in the upper-inner quadrant of the right breast in appropriate position. Surrounding the clip  is persistent distortion and asymmetry. There are no malignant type microcalcifications. Targeted ultrasound is performed, showing normal tissue in the upper inner quadrant of the right breast. Sonographic evaluation of the right axilla does not show any enlarged adenopathy. IMPRESSION: Persistent distortion in the upper inner quadrant of the right breast. RECOMMENDATION: Stereotactic biopsy of the distortion in the upper inner quadrant of the right breast is recommended. I have discussed the findings and recommendations with the patient. If applicable, a reminder letter will be sent to the patient regarding the next appointment. BI-RADS CATEGORY  4: Suspicious. Electronically Signed   By: Dina  Arceo M.D.   On: 02/19/2024 08:40   US  LIMITED ULTRASOUND INCLUDING AXILLA RIGHT BREAST  Result Date: 02/19/2024 CLINICAL DATA:  Short-term interval follow-up of the right breast. Patient underwent a stereotactic biopsy on 08/21/2023 demonstrating stromal fibrosis. Only 1 tissue sample was obtained secondary to failure of the biopsy device. EXAM: DIGITAL DIAGNOSTIC UNILATERAL RIGHT MAMMOGRAM WITH TOMOSYNTHESIS AND CAD; ULTRASOUND RIGHT BREAST LIMITED TECHNIQUE: Right digital diagnostic mammography and breast tomosynthesis was performed. The images were evaluated with computer-aided detection. ; Targeted ultrasound examination of the right breast was performed COMPARISON:  Previous exam(s). ACR Breast Density Category c: The breasts are heterogeneously dense, which may obscure small masses. FINDINGS: There is a coil shaped clip in the upper-inner quadrant of the right breast in appropriate position. Surrounding the clip is persistent distortion and asymmetry. There are no malignant type microcalcifications. Targeted ultrasound is performed, showing normal tissue in the upper inner quadrant of the right breast. Sonographic evaluation of the right axilla does not show any enlarged adenopathy. IMPRESSION: Persistent distortion  in the upper inner quadrant of the right breast. RECOMMENDATION: Stereotactic biopsy of the distortion in the upper inner quadrant of the right breast is recommended. I have discussed the findings and recommendations with the patient. If applicable, a reminder letter will be sent to the patient regarding the next appointment. BI-RADS CATEGORY  4: Suspicious. Electronically Signed   By: Dina  Arceo M.D.   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  We discussed the natural history of *** and general treatment, highlighting the role of radiotherapy in the management.  We discussed the available radiation techniques, and focused on the details of logistics and delivery.  We reviewed the anticipated acute and late sequelae associated with radiation in this setting.  The patient was encouraged to ask questions that I answered to the best of my ability. *** A patient consent form was discussed and signed.  We retained a copy for our records.  The patient would like to proceed with radiation and will be scheduled for CT simulation.  PLAN: ***    *** minutes of total time was spent for this patient encounter, including preparation, face-to-face counseling with the patient and coordination of care, physical exam, and documentation of the encounter.   ------------------------------------------------  Noralee Beam, PhD, MD  This document serves as a record of services personally performed by Retta Caster, MD. It was created on his behalf by Lucky Sable, a trained medical scribe. The creation of this record is based on the scribe's personal observations and the provider's statements to them. This document has been checked and approved by the attending  provider.

## 2024-03-09 ENCOUNTER — Ambulatory Visit
Admission: RE | Admit: 2024-03-09 | Discharge: 2024-03-09 | Discharge status: Home / Self Care | Source: Ambulatory Visit | Attending: Radiation Oncology | Admitting: Radiation Oncology

## 2024-03-09 ENCOUNTER — Ambulatory Visit
Admission: RE | Admit: 2024-03-09 | Discharge: 2024-03-09 | Disposition: A | Discharge status: Home / Self Care | Source: Ambulatory Visit | Attending: Radiation Oncology | Admitting: Radiation Oncology

## 2024-03-09 ENCOUNTER — Encounter: Payer: Self-pay | Admitting: Radiation Oncology

## 2024-03-09 VITALS — BP 146/79 | HR 69 | Temp 97.5°F | Resp 20 | Ht 64.0 in | Wt 214.4 lb

## 2024-03-09 DIAGNOSIS — Z8542 Personal history of malignant neoplasm of other parts of uterus: Secondary | ICD-10-CM | POA: Diagnosis not present

## 2024-03-09 DIAGNOSIS — I129 Hypertensive chronic kidney disease with stage 1 through stage 4 chronic kidney disease, or unspecified chronic kidney disease: Secondary | ICD-10-CM | POA: Diagnosis not present

## 2024-03-09 DIAGNOSIS — Z85828 Personal history of other malignant neoplasm of skin: Secondary | ICD-10-CM | POA: Insufficient documentation

## 2024-03-09 DIAGNOSIS — Z90722 Acquired absence of ovaries, bilateral: Secondary | ICD-10-CM | POA: Diagnosis not present

## 2024-03-09 DIAGNOSIS — Z923 Personal history of irradiation: Secondary | ICD-10-CM | POA: Diagnosis not present

## 2024-03-09 DIAGNOSIS — Z7985 Long-term (current) use of injectable non-insulin antidiabetic drugs: Secondary | ICD-10-CM | POA: Insufficient documentation

## 2024-03-09 DIAGNOSIS — Z170 Estrogen receptor positive status [ER+]: Secondary | ICD-10-CM | POA: Insufficient documentation

## 2024-03-09 DIAGNOSIS — C50211 Malignant neoplasm of upper-inner quadrant of right female breast: Secondary | ICD-10-CM | POA: Diagnosis present

## 2024-03-09 DIAGNOSIS — Z801 Family history of malignant neoplasm of trachea, bronchus and lung: Secondary | ICD-10-CM | POA: Insufficient documentation

## 2024-03-09 DIAGNOSIS — E782 Mixed hyperlipidemia: Secondary | ICD-10-CM | POA: Diagnosis not present

## 2024-03-09 DIAGNOSIS — Z90710 Acquired absence of both cervix and uterus: Secondary | ICD-10-CM | POA: Insufficient documentation

## 2024-03-09 DIAGNOSIS — E1122 Type 2 diabetes mellitus with diabetic chronic kidney disease: Secondary | ICD-10-CM | POA: Diagnosis not present

## 2024-03-09 DIAGNOSIS — Z79899 Other long term (current) drug therapy: Secondary | ICD-10-CM | POA: Insufficient documentation

## 2024-03-09 DIAGNOSIS — N183 Chronic kidney disease, stage 3 unspecified: Secondary | ICD-10-CM | POA: Diagnosis not present

## 2024-03-09 DIAGNOSIS — C50111 Malignant neoplasm of central portion of right female breast: Secondary | ICD-10-CM

## 2024-03-09 DIAGNOSIS — Z803 Family history of malignant neoplasm of breast: Secondary | ICD-10-CM | POA: Diagnosis not present

## 2024-03-09 DIAGNOSIS — C541 Malignant neoplasm of endometrium: Secondary | ICD-10-CM

## 2024-03-09 DIAGNOSIS — Z7982 Long term (current) use of aspirin: Secondary | ICD-10-CM | POA: Insufficient documentation

## 2024-03-11 ENCOUNTER — Ambulatory Visit
Admission: RE | Admit: 2024-03-11 | Discharge: 2024-03-11 | Disposition: A | Discharge status: Home / Self Care | Source: Ambulatory Visit | Attending: General Surgery | Admitting: General Surgery

## 2024-03-11 DIAGNOSIS — C50211 Malignant neoplasm of upper-inner quadrant of right female breast: Secondary | ICD-10-CM

## 2024-03-11 HISTORY — PX: BREAST BIOPSY: SHX20

## 2024-03-14 ENCOUNTER — Encounter (HOSPITAL_BASED_OUTPATIENT_CLINIC_OR_DEPARTMENT_OTHER)
Admission: RE | Disposition: A | Discharge status: Home / Self Care | Payer: Self-pay | Source: Home / Self Care | Attending: General Surgery

## 2024-03-14 ENCOUNTER — Ambulatory Visit (HOSPITAL_BASED_OUTPATIENT_CLINIC_OR_DEPARTMENT_OTHER): Admitting: Anesthesiology

## 2024-03-14 ENCOUNTER — Ambulatory Visit (HOSPITAL_BASED_OUTPATIENT_CLINIC_OR_DEPARTMENT_OTHER)
Admission: RE | Admit: 2024-03-14 | Discharge: 2024-03-14 | Disposition: A | Discharge status: Home / Self Care | Attending: General Surgery | Admitting: General Surgery

## 2024-03-14 ENCOUNTER — Ambulatory Visit
Admission: RE | Admit: 2024-03-14 | Discharge: 2024-03-14 | Disposition: A | Discharge status: Home / Self Care | Source: Ambulatory Visit | Attending: General Surgery | Admitting: General Surgery

## 2024-03-14 ENCOUNTER — Other Ambulatory Visit: Payer: Self-pay

## 2024-03-14 ENCOUNTER — Encounter (HOSPITAL_BASED_OUTPATIENT_CLINIC_OR_DEPARTMENT_OTHER): Payer: Self-pay | Admitting: General Surgery

## 2024-03-14 DIAGNOSIS — Z853 Personal history of malignant neoplasm of breast: Secondary | ICD-10-CM | POA: Insufficient documentation

## 2024-03-14 DIAGNOSIS — Z803 Family history of malignant neoplasm of breast: Secondary | ICD-10-CM | POA: Diagnosis not present

## 2024-03-14 DIAGNOSIS — Z1732 Human epidermal growth factor receptor 2 negative status: Secondary | ICD-10-CM | POA: Insufficient documentation

## 2024-03-14 DIAGNOSIS — E119 Type 2 diabetes mellitus without complications: Secondary | ICD-10-CM

## 2024-03-14 DIAGNOSIS — Z8542 Personal history of malignant neoplasm of other parts of uterus: Secondary | ICD-10-CM | POA: Insufficient documentation

## 2024-03-14 DIAGNOSIS — N189 Chronic kidney disease, unspecified: Secondary | ICD-10-CM | POA: Diagnosis not present

## 2024-03-14 DIAGNOSIS — Z1721 Progesterone receptor positive status: Secondary | ICD-10-CM | POA: Insufficient documentation

## 2024-03-14 DIAGNOSIS — I129 Hypertensive chronic kidney disease with stage 1 through stage 4 chronic kidney disease, or unspecified chronic kidney disease: Secondary | ICD-10-CM | POA: Insufficient documentation

## 2024-03-14 DIAGNOSIS — Z8049 Family history of malignant neoplasm of other genital organs: Secondary | ICD-10-CM | POA: Insufficient documentation

## 2024-03-14 DIAGNOSIS — E1122 Type 2 diabetes mellitus with diabetic chronic kidney disease: Secondary | ICD-10-CM | POA: Insufficient documentation

## 2024-03-14 DIAGNOSIS — Z170 Estrogen receptor positive status [ER+]: Secondary | ICD-10-CM | POA: Insufficient documentation

## 2024-03-14 DIAGNOSIS — C50211 Malignant neoplasm of upper-inner quadrant of right female breast: Secondary | ICD-10-CM

## 2024-03-14 DIAGNOSIS — Z923 Personal history of irradiation: Secondary | ICD-10-CM | POA: Insufficient documentation

## 2024-03-14 DIAGNOSIS — N183 Chronic kidney disease, stage 3 unspecified: Secondary | ICD-10-CM

## 2024-03-14 HISTORY — PX: BREAST LUMPECTOMY WITH RADIOACTIVE SEED LOCALIZATION: SHX6424

## 2024-03-14 HISTORY — DX: Other complications of anesthesia, initial encounter: T88.59XA

## 2024-03-14 LAB — GLUCOSE, CAPILLARY
Glucose-Capillary: 111 mg/dL — ABNORMAL HIGH (ref 70–99)
Glucose-Capillary: 119 mg/dL — ABNORMAL HIGH (ref 70–99)

## 2024-03-14 SURGERY — BREAST LUMPECTOMY WITH RADIOACTIVE SEED LOCALIZATION
Anesthesia: General | Site: Breast | Laterality: Right

## 2024-03-14 MED ORDER — BUPIVACAINE HCL (PF) 0.25 % IJ SOLN
INTRAMUSCULAR | Status: AC
  Filled 2024-03-14: qty 30

## 2024-03-14 MED ORDER — LIDOCAINE-EPINEPHRINE (PF) 1 %-1:200000 IJ SOLN
INTRAMUSCULAR | Status: DC | PRN
  Administered 2024-03-14: 60 mL via INTRAMUSCULAR

## 2024-03-14 MED ORDER — PROPOFOL 10 MG/ML IV BOLUS
INTRAVENOUS | Status: DC | PRN
Start: 2024-03-14 — End: 2024-03-14
  Administered 2024-03-14: 150 mg via INTRAVENOUS

## 2024-03-14 MED ORDER — LIDOCAINE HCL (CARDIAC) PF 100 MG/5ML IV SOSY
PREFILLED_SYRINGE | INTRAVENOUS | Status: DC | PRN
  Administered 2024-03-14: 100 mg via INTRAVENOUS

## 2024-03-14 MED ORDER — OXYCODONE HCL 5 MG PO TABS: 5.0000 mg | ORAL_TABLET | Freq: Once | ORAL | Status: DC | PRN

## 2024-03-14 MED ORDER — OXYCODONE HCL 5 MG/5ML PO SOLN: 5.0000 mg | Freq: Once | ORAL | Status: DC | PRN

## 2024-03-14 MED ORDER — CHLORHEXIDINE GLUCONATE CLOTH 2 % EX PADS: 6.0000 | MEDICATED_PAD | Freq: Once | CUTANEOUS | Status: DC

## 2024-03-14 MED ORDER — ACETAMINOPHEN 500 MG PO TABS: 1000.0000 mg | ORAL_TABLET | Freq: Once | ORAL | Status: DC

## 2024-03-14 MED ORDER — PHENYLEPHRINE HCL-NACL 20-0.9 MG/250ML-% IV SOLN
INTRAVENOUS | Status: AC
  Filled 2024-03-14: qty 250

## 2024-03-14 MED ORDER — FENTANYL CITRATE (PF) 100 MCG/2ML IJ SOLN: 25.0000 ug | INTRAMUSCULAR | Status: DC | PRN

## 2024-03-14 MED ORDER — DROPERIDOL 2.5 MG/ML IJ SOLN: 0.6250 mg | Freq: Once | INTRAMUSCULAR | Status: DC | PRN

## 2024-03-14 MED ORDER — LIDOCAINE-EPINEPHRINE (PF) 1 %-1:200000 IJ SOLN
INTRAMUSCULAR | Status: AC
  Filled 2024-03-14: qty 30

## 2024-03-14 MED ORDER — LIDOCAINE 2% (20 MG/ML) 5 ML SYRINGE
INTRAMUSCULAR | Status: AC
  Filled 2024-03-14: qty 5

## 2024-03-14 MED ORDER — CEFAZOLIN SODIUM-DEXTROSE 2-4 GM/100ML-% IV SOLN
INTRAVENOUS | Status: AC
  Filled 2024-03-14: qty 100

## 2024-03-14 MED ORDER — ACETAMINOPHEN 500 MG PO TABS
ORAL_TABLET | ORAL | Status: AC
  Filled 2024-03-14: qty 2

## 2024-03-14 MED ORDER — EPHEDRINE SULFATE (PRESSORS) 50 MG/ML IJ SOLN
INTRAMUSCULAR | Status: DC | PRN
  Administered 2024-03-14 (×2): 10 mg via INTRAVENOUS

## 2024-03-14 MED ORDER — FENTANYL CITRATE (PF) 100 MCG/2ML IJ SOLN
INTRAMUSCULAR | Status: AC
  Filled 2024-03-14: qty 2

## 2024-03-14 MED ORDER — LACTATED RINGERS IV SOLN: INTRAVENOUS | Status: DC

## 2024-03-14 MED ORDER — PROPOFOL 10 MG/ML IV BOLUS
INTRAVENOUS | Status: AC
  Filled 2024-03-14: qty 20

## 2024-03-14 MED ORDER — PHENYLEPHRINE HCL-NACL 20-0.9 MG/250ML-% IV SOLN
INTRAVENOUS | Status: DC | PRN
  Administered 2024-03-14: 50 ug/min via INTRAVENOUS

## 2024-03-14 MED ORDER — ONDANSETRON HCL 4 MG/2ML IJ SOLN
INTRAMUSCULAR | Status: DC | PRN
  Administered 2024-03-14: 4 mg via INTRAVENOUS

## 2024-03-14 MED ORDER — DEXAMETHASONE SODIUM PHOSPHATE 10 MG/ML IJ SOLN
INTRAMUSCULAR | Status: AC
  Filled 2024-03-14: qty 1

## 2024-03-14 MED ORDER — PHENYLEPHRINE HCL (PRESSORS) 10 MG/ML IV SOLN
INTRAVENOUS | Status: DC | PRN
  Administered 2024-03-14: 80 ug via INTRAVENOUS
  Administered 2024-03-14: 160 ug via INTRAVENOUS
  Administered 2024-03-14: 80 ug via INTRAVENOUS

## 2024-03-14 MED ORDER — ACETAMINOPHEN 500 MG PO TABS
1000.0000 mg | ORAL_TABLET | ORAL | Status: AC
  Administered 2024-03-14: 1000 mg via ORAL

## 2024-03-14 MED ORDER — DEXMEDETOMIDINE HCL IN NACL 80 MCG/20ML IV SOLN
INTRAVENOUS | Status: AC
  Filled 2024-03-14: qty 20

## 2024-03-14 MED ORDER — 0.9 % SODIUM CHLORIDE (POUR BTL) OPTIME
TOPICAL | Status: DC | PRN
  Administered 2024-03-14: 1000 mL

## 2024-03-14 MED ORDER — ONDANSETRON HCL 4 MG/2ML IJ SOLN
INTRAMUSCULAR | Status: AC
  Filled 2024-03-14: qty 2

## 2024-03-14 MED ORDER — FENTANYL CITRATE (PF) 100 MCG/2ML IJ SOLN
INTRAMUSCULAR | Status: AC
Start: 2024-03-14 — End: ?
  Filled 2024-03-14: qty 2

## 2024-03-14 MED ORDER — PROPOFOL 500 MG/50ML IV EMUL
INTRAVENOUS | Status: AC
  Filled 2024-03-14: qty 50

## 2024-03-14 MED ORDER — OXYCODONE HCL 5 MG PO TABS: 2.5000 mg | ORAL_TABLET | Freq: Four times a day (QID) | ORAL | 0 refills | Status: AC | PRN

## 2024-03-14 MED ORDER — DEXAMETHASONE SODIUM PHOSPHATE 4 MG/ML IJ SOLN
INTRAMUSCULAR | Status: DC | PRN
  Administered 2024-03-14: 4 mg via INTRAVENOUS

## 2024-03-14 MED ORDER — CEFAZOLIN SODIUM-DEXTROSE 2-4 GM/100ML-% IV SOLN
2.0000 g | INTRAVENOUS | Status: AC
  Administered 2024-03-14: 2 g via INTRAVENOUS

## 2024-03-14 MED ORDER — GLYCOPYRROLATE 0.2 MG/ML IJ SOLN
INTRAMUSCULAR | Status: DC | PRN
  Administered 2024-03-14: .1 mg via INTRAVENOUS

## 2024-03-14 MED ORDER — FENTANYL CITRATE (PF) 100 MCG/2ML IJ SOLN
INTRAMUSCULAR | Status: DC | PRN
  Administered 2024-03-14: 50 ug via INTRAVENOUS

## 2024-03-14 SURGICAL SUPPLY — 46 items
BINDER BREAST LRG (GAUZE/BANDAGES/DRESSINGS) IMPLANT
BINDER BREAST MEDIUM (GAUZE/BANDAGES/DRESSINGS) IMPLANT
BINDER BREAST XLRG (GAUZE/BANDAGES/DRESSINGS) IMPLANT
BINDER BREAST XXLRG (GAUZE/BANDAGES/DRESSINGS) IMPLANT
BLADE SURG 10 STRL SS (BLADE) ×1 IMPLANT
BLADE SURG 15 STRL LF DISP TIS (BLADE) IMPLANT
CANISTER SUC SOCK COL 7IN (MISCELLANEOUS) IMPLANT
CANISTER SUCT 1200ML W/VALVE (MISCELLANEOUS) IMPLANT
CHLORAPREP W/TINT 26 (MISCELLANEOUS) ×1 IMPLANT
CLIP TI LARGE 6 (CLIP) ×1 IMPLANT
CLIP TI MEDIUM 6 (CLIP) IMPLANT
COVER BACK TABLE 60X90IN (DRAPES) ×1 IMPLANT
COVER MAYO STAND STRL (DRAPES) ×1 IMPLANT
COVER PROBE CYLINDRICAL 5X96 (MISCELLANEOUS) ×1 IMPLANT
DERMABOND ADVANCED .7 DNX12 (GAUZE/BANDAGES/DRESSINGS) ×1 IMPLANT
DRAPE LAPAROSCOPIC ABDOMINAL (DRAPES) ×1 IMPLANT
DRAPE UTILITY XL STRL (DRAPES) ×1 IMPLANT
ELECT COATED BLADE 2.86 ST (ELECTRODE) ×1 IMPLANT
ELECTRODE REM PT RTRN 9FT ADLT (ELECTROSURGICAL) ×1 IMPLANT
GAUZE PAD ABD 8X10 STRL (GAUZE/BANDAGES/DRESSINGS) IMPLANT
GAUZE SPONGE 4X4 12PLY STRL (GAUZE/BANDAGES/DRESSINGS) IMPLANT
GAUZE SPONGE 4X4 12PLY STRL LF (GAUZE/BANDAGES/DRESSINGS) ×1 IMPLANT
GLOVE BIO SURGEON STRL SZ 6 (GLOVE) ×1 IMPLANT
GLOVE BIOGEL PI IND STRL 6.5 (GLOVE) ×1 IMPLANT
GOWN STRL REUS W/ TWL LRG LVL3 (GOWN DISPOSABLE) ×1 IMPLANT
GOWN STRL REUS W/ TWL XL LVL3 (GOWN DISPOSABLE) ×1 IMPLANT
KIT MARKER MARGIN INK (KITS) ×1 IMPLANT
LIGHT WAVEGUIDE WIDE FLAT (MISCELLANEOUS) IMPLANT
NDL HYPO 25X1 1.5 SAFETY (NEEDLE) ×1 IMPLANT
NEEDLE HYPO 25X1 1.5 SAFETY (NEEDLE) ×1 IMPLANT
NS IRRIG 1000ML POUR BTL (IV SOLUTION) ×1 IMPLANT
PACK BASIN DAY SURGERY FS (CUSTOM PROCEDURE TRAY) ×1 IMPLANT
PENCIL SMOKE EVACUATOR (MISCELLANEOUS) ×1 IMPLANT
SLEEVE SCD COMPRESS KNEE MED (STOCKING) ×1 IMPLANT
SPIKE FLUID TRANSFER (MISCELLANEOUS) IMPLANT
SPONGE T-LAP 18X18 ~~LOC~~+RFID (SPONGE) ×1 IMPLANT
STRIP CLOSURE SKIN 1/2X4 (GAUZE/BANDAGES/DRESSINGS) ×1 IMPLANT
SUT MNCRL AB 4-0 PS2 18 (SUTURE) ×1 IMPLANT
SUT SILK 2 0 SH (SUTURE) IMPLANT
SUT VIC AB 2-0 SH 27XBRD (SUTURE) ×1 IMPLANT
SUT VIC AB 3-0 SH 27X BRD (SUTURE) ×1 IMPLANT
SYR CONTROL 10ML LL (SYRINGE) ×1 IMPLANT
TOWEL GREEN STERILE FF (TOWEL DISPOSABLE) ×1 IMPLANT
TRAY FAXITRON CT DISP (TRAY / TRAY PROCEDURE) ×1 IMPLANT
TUBE CONNECTING 20X1/4 (TUBING) IMPLANT
YANKAUER SUCT BULB TIP NO VENT (SUCTIONS) IMPLANT

## 2024-03-14 NOTE — Anesthesia Procedure Notes (Signed)
 Procedure Name: LMA Insertion Date/Time: 03/14/2024 8:03 AM  Performed by: Lonia Ro, CRNAPre-anesthesia Checklist: Patient identified, Emergency Drugs available, Suction available and Patient being monitored Patient Re-evaluated:Patient Re-evaluated prior to induction Oxygen Delivery Method: Circle system utilized Preoxygenation: Pre-oxygenation with 100% oxygen Induction Type: IV induction Ventilation: Mask ventilation without difficulty LMA: LMA inserted LMA Size: 4.0 Number of attempts: 1 Placement Confirmation: positive ETCO2 and breath sounds checked- equal and bilateral Tube secured with: Tape Dental Injury: Teeth and Oropharynx as per pre-operative assessment

## 2024-03-14 NOTE — H&P (Signed)
 REFERRING PHYSICIAN: Mir  PROVIDER: Eppie Hasting, MD  Care Team: Patient Care Team: Denna Fish, MD as PCP - General (Family Medicine) Hilton Lucky, MD (Gynecologic Oncology) Noralee Beam, MD (Radiation Oncology)   MRN: [id number] DOB: January 13, 1951 DATE OF ENCOUNTER: 03/01/2024  Subjective   Chief Complaint: Right Breast Cancer   History of Present Illness: Becky Dudley is a 73 y.o. female who is seen today as an office consultation at the request of Dr. Donia Furlough for evaluation of Right Breast Cancer .   Patient presents with a new diagnosis of right breast cancer May 2025. The patient was being followed by short interval follow-up for asymmetry in the right upper inner breast. A biopsy in November showed stromal fibrosis but this was felt to potentially be discordant. Only 1 tissue sample had been obtained due to failure of the biopsy device. Repeat examination was performed in May and repeat mammogram was suspicious. This should continue to show surrounding distortion and asymmetry and repeat biopsy was performed. There was no sonographic correlate. 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 The ER and PR has not yet been resulted.  Of note, patient has a personal history of endometrial cancer treated in 2022. Patient underwent surgery and radiation. Patient's sister is a patient of mine.  History of Present Illness She has a history of uterine cancer treated with hysterectomy and radiation therapy, with no indication of recurrence. The current breast cancer is not directly related to the previous uterine cancer.  A biopsy was performed last fall, but only one sample was obtained due to equipment issues. A subsequent mammogram in October showed changes, leading to a suggested biopsy which confirmed the diagnosis of low-grade ductal carcinoma in situ (DCIS). A mammogram in 2023 initially appeared  benign, with follow-up imaging six months later showing no changes, allowing her to return to annual screenings. However, the recent mammogram indicated differences, prompting further investigation.  She has a family history of breast cancer, with two sisters having been diagnosed, raising concerns about her genetic predisposition. She is unsure if genetic testing was performed during her uterine cancer treatment.  She experienced vertigo after a previous robotic surgery where she was positioned "upside down". She did not experience pain post-surgery but had vertigo-related nausea for about three weeks.  She is currently working for [employer] and is considering the time needed off work post-surgery, estimating about a week or two depending on recovery. She has not required pain medication for past surgeries, including her hysterectomy and biopsies.  Family cancer history - paternal aunt had breast cancer and uterine cancer.   Diagnostic mammogram/us  :02/19/2024 ACR Breast Density Category c: The breasts are heterogeneously dense, which may obscure small masses. FINDINGS: There is a coil shaped clip in the upper-inner quadrant of the right breast in appropriate position. Surrounding the clip is persistent distortion and asymmetry. There are no malignant type microcalcifications. Targeted ultrasound is performed, showing normal tissue in the upper inner quadrant of the right breast. Sonographic evaluation of the right axilla does not show any enlarged adenopathy. IMPRESSION: Persistent distortion in the upper inner quadrant of the right breast. RECOMMENDATION: Stereotactic biopsy of the distortion in the upper inner quadrant of the right breast is recommended. I have discussed the findings and recommendations with the patient. If applicable, a reminder letter will be sent to the patient regarding the next appointment. BI-RADS CATEGORY 4: Suspicious.  Pathology core needle biopsy:  02/24/2024 1.  Breast, right, needle core biopsy, Upper inner quadrant, (ribbon clip) :  - DUCTAL CARCINOMA IN SITU, INTERMEDIATE GRADE  - NECROSIS: PRESENT  - CALCIFICATIONS: NOT IDENTIFIED  - DCIS LENGTH: 0.5 CM   Receptors:pending  Review of Systems: A complete review of systems was obtained from the patient. I have reviewed this information and discussed as appropriate with the patient. See HPI as well for other ROS. ROS -negative other than occasional cough  Medical History: Past Medical History:  Diagnosis Date  Arthritis  Chronic kidney disease  Diabetes mellitus without complication (CMS/HHS-HCC)  History of cancer  Hypertension   Patient Active Problem List  Diagnosis  Breast cancer of upper-inner quadrant of right female breast (CMS/HHS-HCC)  Endometrial cancer (CMS/HHS-HCC)   Past Surgical History:  Procedure Laterality Date  CHOLECYSTECTOMY 1989  DILATATION & CURETTAGE/HYSTEROSCOPY WITH MYOSURE 09/30/2021  Dr. Harriette Limerick  XI ROBOTIC ASSISTED TOTAL HYSTERECTOMY WITH BILATERAL SALPINGO OOPHORECTOMY;CYSTOSCOPY (Bilateral) SENTINEL NODE BIOPSY LYMPH NODE DISSECTION 10/10/2021  Dr. Christian Cowden    Allergies  Allergen Reactions  Lisinopril Cough   Current Outpatient Medications on File Prior to Visit  Medication Sig Dispense Refill  amLODIPine (NORVASC) 10 MG tablet Take 5 mg by mouth once daily  OZEMPIC 1 mg/dose (4 mg/3 mL) pen injector INJECT 1MG  INTO THE SKIN ONCE WEEKLY AS DIRECTED  acetaminophen  (TYLENOL ) 325 MG tablet Take 650 mg by mouth  aspirin 81 MG chewable tablet Take 81 mg by mouth once daily  cholecalciferol, vitamin D3, (VITAMIN D3) 125 mcg (5,000 unit) tablet Take 5,000 Units by mouth once daily  diclofenac (VOLTAREN) 1 % topical gel Apply 2 g topically  loratadine (CLARITIN) 10 mg tablet Take 10 mg by mouth  rosuvastatin (CRESTOR) 5 MG tablet Take 5 mg by mouth at bedtime  telmisartan (MICARDIS) 40 MG tablet Take 40 mg by mouth once daily  zinc  gluconate 50 mg tablet Take 25 mg by mouth once daily   No current facility-administered medications on file prior to visit.   Family History  Problem Relation Age of Onset  Stroke Mother  High blood pressure (Hypertension) Mother  Hyperlipidemia (Elevated cholesterol) Mother  Coronary Artery Disease (Blocked arteries around heart) Mother  Diabetes Mother  Deep vein thrombosis (DVT or abnormal blood clot formation) Mother  Stroke Father  Skin cancer Father  Obesity Father  High blood pressure (Hypertension) Father  Coronary Artery Disease (Blocked arteries around heart) Father  Skin cancer Sister  Obesity Sister  High blood pressure (Hypertension) Sister  Diabetes Sister  Breast cancer Sister    Social History   Tobacco Use  Smoking Status Never  Smokeless Tobacco Never    Social History   Socioeconomic History  Marital status: Unknown  Tobacco Use  Smoking status: Never  Smokeless tobacco: Never  Substance and Sexual Activity  Alcohol use: Never  Drug use: Never   Social Drivers of Health   Housing Stability: Unknown (03/01/2024)  Housing Stability Vital Sign  Homeless in the Last Year: No   Objective:   Vitals:  03/01/24 1340  BP: 136/76  Pulse: (!) 115  Temp: 36.4 C (97.6 F)  SpO2: 96%  Weight: 96.3 kg (212 lb 3.2 oz)  Height: 162.6 cm (5\' 4" )  PainSc: 0-No pain   Body mass index is 36.42 kg/m.  Gen: No acute distress. Well nourished and well groomed.  Neurological: Alert and oriented to person, place, and time. Coordination normal.  Head: Normocephalic and atraumatic.  Eyes: Conjunctivae are normal. Pupils are equal, round, and  reactive to light. No scleral icterus.  Neck: Normal range of motion. Neck supple. No tracheal deviation or thyromegaly present.  Cardiovascular: Normal rate, regular rhythm, normal heart sounds and intact distal pulses. Exam reveals no gallop and no friction rub. No murmur heard. Breast: Relatively symmetric. No  palpable masses. No nipple retraction or nipple discharge, no axillary adenopathy, no skin dimpling or contour abnormalities, left breast benign Respiratory: Effort normal. No respiratory distress. No chest wall tenderness. Breath sounds normal. No wheezes, rales or rhonchi.  GI: Soft. Bowel sounds are normal. The abdomen is soft and nontender. There is no rebound and no guarding.  Musculoskeletal: Normal range of motion. Extremities are nontender.  Lymphadenopathy: No cervical, preauricular, postauricular or axillary adenopathy is present Skin: Skin is warm and dry. No rash noted. No diaphoresis. No erythema. No pallor. No clubbing, cyanosis, or edema.  Psychiatric: Normal mood and affect. Behavior is normal. Judgment and thought content normal.   Labs None available  Assessment and Plan:   ICD-10-CM  1. Malignant neoplasm of upper-inner quadrant of right female breast, unspecified estrogen receptor status (CMS/HHS-HCC) C50.211 Ambulatory Referral to Oncology-Medical  Ambulatory Referral to Radiation Oncology  Ambulatory Referral to Cancer Genetics - [id number]   2. Endometrial cancer (CMS/HHS-HCC) C54.1 Ambulatory Referral to Cancer Genetics - [id number]    Assessment & Plan Stage 0 Breast Cancer DCIS, low grade, noninvasive, excellent prognosis. Evaluating hormone receptor status to determine need for antihormone therapy. Lumpectomy recommended due to small size. Low recurrence and mortality risk. - Recommend lumpectomy to excise DCIS. - Use radioactive seed for surgical guidance. - Send excised tissue to pathology for margin assessment - Discuss potential need for radiation therapy post-surgery based on hormone receptor status and margin results. - Discuss potential antihormone therapy post-surgery if hormone receptor positive. - Refer to medical oncology and radiation oncology post-surgery for further postop recommendations - Schedule preoperative appointment and seed placement in  radiology. - Prescribe post-operative compression bra from [place] [employer] [place]. - Advise on post-operative care, including pain management with Tylenol  or ibuprofen and limited narcotic use. - Advise on potential post-operative complications such as seroma, infection, and breast pain. - Discuss genetic testing for hereditary cancer risk due to family history. Placing referral  Uterine Cancer Treated with hysterectomy and radiation. No evidence of recurrence or metastasis. Genetic testing recommended for hereditary cancer syndromes. - Recommend genetic testing to assess for hereditary cancer syndromes.  Vertigo Intermittent vertigo, possibly exacerbated by surgical positions. Vertigo post-hysterectomy due to positioning during robotic surgery. Upcoming breast surgery will avoid extreme positioning to minimize vertigo risk.

## 2024-03-14 NOTE — Op Note (Signed)
 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 Receptors +/+/-, Ki 67 5%.   Pre-operative Diagnosis: right breast cancer  Post-operative Diagnosis: Sam4e  Surgeon: Lockie Rima   Anesthesia: General endotracheal anesthesia  ASA Class: 2  Procedure Details  The patient was seen in the Holding Room. The risks, benefits, complications, treatment options, and expected outcomes were discussed with the patient. The possibilities of bleeding, infection, the need for additional procedures, failure to diagnose a condition, and creating a complication requiring other procedures or operations were discussed with the patient. The patient concurred with the proposed plan, giving informed consent.  The site of surgery properly noted/marked. The patient was taken to Operating Room # 1, identified, and the procedure verified as right breast seed localized lumpectomy.  The right breast and chest were prepped and draped in standard fashion. A transverse incision was made near the previously placed radioactive seed.  Dissection was carried down around the point of maximum signal intensity. The cautery was used to perform the dissection.   The specimen was inked with the margin marker paint kit.    Specimen radiography confirmed inclusion of the mammographic lesion, the clip, and the seed.  The background signal in the breast was zero.  Hemostasis was achieved with cautery.  The cavity was marked with clips on each border other than the anterior border.  The adjacent tissue was pulled together to minimize the defect.  The wound was irrigated and closed with 3-0 vicryl interrupted deep dermal sutures and 4-0 monocryl running subcuticular suture.      Sterile dressings were applied. At the end of the operation, all sponge, instrument, and needle counts were  correct.   Findings: Seed, clip in specimen.  anterior margin is skin, posterior margin is pectoralis   Estimated Blood Loss:  min         Specimens: right breast tissue with seed         Complications:  None; patient tolerated the procedure well.         Disposition: PACU - hemodynamically stable.         Condition: stable

## 2024-03-14 NOTE — Interval H&P Note (Signed)
 History and Physical Interval Note:  03/14/2024 7:41 AM  Becky Dudley  has presented today for surgery, with the diagnosis of RIGHT BREAST CANCER.  The various methods of treatment have been discussed with the patient and family. After consideration of risks, benefits and other options for treatment, the patient has consented to  Procedure(s): BREAST LUMPECTOMY WITH RADIOACTIVE SEED LOCALIZATION (Right) as a surgical intervention.  The patient's history has been reviewed, patient examined, no change in status, stable for surgery.  I have reviewed the patient's chart and labs.  Questions were answered to the patient's satisfaction.     Lockie Rima

## 2024-03-14 NOTE — Discharge Instructions (Addendum)
 [facility] Office Phone Number [phone]  BREAST BIOPSY/ PARTIAL MASTECTOMY: POST OP INSTRUCTIONS  Always review your discharge instruction sheet given to you by the facility where your surgery was performed.  IF YOU HAVE DISABILITY OR FAMILY LEAVE FORMS, YOU MUST BRING THEM TO THE OFFICE FOR PROCESSING.  DO NOT GIVE THEM TO YOUR DOCTOR.  Take 2 tylenol  (acetominophen) three times a day for 3 days.  If you still have pain, add ibuprofen with food in between if able to take this (if you have kidney issues or stomach issues, do not take ibuprofen).  If both of those are not enough, add the narcotic pain pill.  If you find you are needing a lot of this overnight after surgery, call the next morning for a refill.    Prescriptions will not be filled after 5pm or on week-ends. Take your usually prescribed medications unless otherwise directed You should eat very light the first 24 hours after surgery, such as soup, crackers, pudding, etc.  Resume your normal diet the day after surgery. Most patients will experience some swelling and bruising in the breast.  Ice packs and a good support bra will help.  Swelling and bruising can take several days to resolve.  It is common to experience some constipation if taking pain medication after surgery.  Increasing fluid intake and taking a stool softener will usually help or prevent this problem from occurring.  A mild laxative (Milk of Magnesia or Miralax) should be taken according to package directions if there are no bowel movements after 48 hours. Unless discharge instructions indicate otherwise, you may remove your bandages 48 hours after surgery, and you may shower at that time.  You may have steri-strips (small skin tapes) in place directly over the incision.  These strips should be left on the skin at least for for 7-10 days.    ACTIVITIES:  You may resume regular daily activities (gradually increasing) beginning the next day.  Wearing a  good support bra or sports bra (or the breast binder) minimizes pain and swelling.  You may have sexual intercourse when it is comfortable. No heavy lifting for 1-2 weeks (not over around 10 pounds).  You may drive when you no longer are taking prescription pain medication, you can comfortably wear a seatbelt, and you can safely maneuver your car and apply brakes. RETURN TO WORK:  __________3-14 days depending on job. _______________ Becky Dudley should see your doctor in the office for a follow-up appointment approximately two weeks after your surgery.  Your doctor's nurse will typically make your follow-up appointment when she calls you with your pathology report.  Expect your pathology report 3-4 business days after your surgery.  You may call to check if you do not hear from us  after three days.   WHEN TO CALL YOUR DOCTOR: Fever over 101.0 Nausea and/or vomiting. Extreme swelling or bruising. Continued bleeding from incision. Increased pain, redness, or drainage from the incision.  The clinic staff is available to answer your questions during regular business hours.  Please don't hesitate to call and ask to speak to one of the nurses for clinical concerns.  If you have a medical emergency, go to the nearest emergency room or call 911.  A surgeon from [facility] is always on call at the hospital.  For further questions, please visit [email or web address]     Post Anesthesia Home Care Instructions  Activity: Get plenty of rest for the remainder of the day. A responsible individual must  stay with you for 24 hours following the procedure.  For the next 24 hours, DO NOT: -Drive a car -Advertising copywriter -Drink alcoholic beverages -Take any medication unless instructed by your physician -Make any legal decisions or sign important papers.  Meals: Start with liquid foods such as gelatin or soup. Progress to regular foods as tolerated. Avoid greasy, spicy, heavy foods. If nausea  and/or vomiting occur, drink only clear liquids until the nausea and/or vomiting subsides. Call your physician if vomiting continues.  Special Instructions/Symptoms: Your throat may feel dry or sore from the anesthesia or the breathing tube placed in your throat during surgery. If this causes discomfort, gargle with warm salt water . The discomfort should disappear within 24 hours.  If you had a scopolamine patch placed behind your ear for the management of post- operative nausea and/or vomiting:  1. The medication in the patch is effective for 72 hours, after which it should be removed.  Wrap patch in a tissue and discard in the trash. Wash hands thoroughly with soap and water . 2. You may remove the patch earlier than 72 hours if you experience unpleasant side effects which may include dry mouth, dizziness or visual disturbances. 3. Avoid touching the patch. Wash your hands with soap and water  after contact with the patch.  No tylenol  until after 1pm today if needed.

## 2024-03-14 NOTE — Anesthesia Preprocedure Evaluation (Addendum)
 Anesthesia Evaluation  Patient identified by MRN, date of birth, ID band Patient awake    Reviewed: Allergy & Precautions, NPO status , Patient's Chart, lab work & pertinent test results, reviewed documented beta blocker date and time   History of Anesthesia Complications (+) PONV and history of anesthetic complications  Airway Mallampati: II  TM Distance: >3 FB     Dental no notable dental hx.    Pulmonary neg COPD   breath sounds clear to auscultation       Cardiovascular hypertension, Pt. on medications (-) CAD, (-) Past MI, (-) Cardiac Stents and (-) CABG  Rhythm:Regular Rate:Normal     Neuro/Psych neg Seizures    GI/Hepatic ,neg GERD  ,,(+) neg Cirrhosis        Endo/Other  diabetes, Type 2    Renal/GU CRFRenal disease     Musculoskeletal  (+) Arthritis , Osteoarthritis,    Abdominal   Peds  Hematology   Anesthesia Other Findings Bilateral LE edema   Reproductive/Obstetrics                              Anesthesia Physical Anesthesia Plan  ASA: 2  Anesthesia Plan: General   Post-op Pain Management:    Induction: Intravenous  PONV Risk Score and Plan: 2 and Ondansetron  and Dexamethasone   Airway Management Planned: LMA  Additional Equipment:   Intra-op Plan:   Post-operative Plan: Extubation in OR  Informed Consent: I have reviewed the patients History and Physical, chart, labs and discussed the procedure including the risks, benefits and alternatives for the proposed anesthesia with the patient or authorized representative who has indicated his/her understanding and acceptance.     Dental advisory given  Plan Discussed with: CRNA  Anesthesia Plan Comments:          Anesthesia Quick Evaluation

## 2024-03-14 NOTE — Transfer of Care (Signed)
 Immediate Anesthesia Transfer of Care Note  Patient: Becky Dudley  Procedure(s) Performed: BREAST LUMPECTOMY WITH RADIOACTIVE SEED LOCALIZATION (Right: Breast)  Patient Location: PACU  Anesthesia Type:General  Level of Consciousness: drowsy and patient cooperative  Airway & Oxygen Therapy: Patient Spontanous Breathing and Patient connected to face mask oxygen  Post-op Assessment: Report given to RN and Post -op Vital signs reviewed and stable  Post vital signs: Reviewed and stable  Last Vitals:  Vitals Value Taken Time  BP 128/65 03/14/24 0921  Temp    Pulse 73 03/14/24 0922  Resp 17 03/14/24 0922  SpO2 99 % 03/14/24 0922  Vitals shown include unfiled device data.  Last Pain:  Vitals:   03/14/24 0652  TempSrc: Temporal  PainSc: 0-No pain      Patients Stated Pain Goal: 7 (03/14/24 1610)  Complications: No notable events documented.

## 2024-03-15 ENCOUNTER — Encounter (HOSPITAL_BASED_OUTPATIENT_CLINIC_OR_DEPARTMENT_OTHER): Payer: Self-pay | Admitting: General Surgery

## 2024-03-15 NOTE — Anesthesia Postprocedure Evaluation (Signed)
 Anesthesia Post Note  Patient: Becky Dudley  Procedure(s) Performed: BREAST LUMPECTOMY WITH RADIOACTIVE SEED LOCALIZATION (Right: Breast)     Patient location during evaluation: PACU Anesthesia Type: General Level of consciousness: awake and alert Pain management: pain level controlled Vital Signs Assessment: post-procedure vital signs reviewed and stable Respiratory status: spontaneous breathing, nonlabored ventilation, respiratory function stable and patient connected to nasal cannula oxygen Cardiovascular status: blood pressure returned to baseline and stable Postop Assessment: no apparent nausea or vomiting Anesthetic complications: no   No notable events documented.  Last Vitals:  Vitals:   03/14/24 0930 03/14/24 1010  BP: 131/63 (!) 145/89  Pulse: 73 75  Resp: 17 16  Temp:  (!) 36.1 C  SpO2: 99% 94%    Last Pain:  Vitals:   03/14/24 0652  TempSrc: Temporal   Pain Goal: Patients Stated Pain Goal: 7 (03/14/24 0652)                 Leslye Rast

## 2024-03-17 LAB — SURGICAL PATHOLOGY

## 2024-03-22 ENCOUNTER — Inpatient Hospital Stay: Attending: Hematology and Oncology | Admitting: Hematology and Oncology

## 2024-03-22 ENCOUNTER — Other Ambulatory Visit: Payer: Self-pay | Admitting: *Deleted

## 2024-03-22 VITALS — BP 148/84 | HR 84 | Temp 98.3°F | Resp 18 | Ht 64.0 in | Wt 213.6 lb

## 2024-03-22 DIAGNOSIS — Z8542 Personal history of malignant neoplasm of other parts of uterus: Secondary | ICD-10-CM | POA: Diagnosis not present

## 2024-03-22 DIAGNOSIS — C50211 Malignant neoplasm of upper-inner quadrant of right female breast: Secondary | ICD-10-CM

## 2024-03-22 DIAGNOSIS — Z1732 Human epidermal growth factor receptor 2 negative status: Secondary | ICD-10-CM | POA: Diagnosis not present

## 2024-03-22 DIAGNOSIS — Z170 Estrogen receptor positive status [ER+]: Secondary | ICD-10-CM | POA: Diagnosis not present

## 2024-03-22 DIAGNOSIS — Z803 Family history of malignant neoplasm of breast: Secondary | ICD-10-CM | POA: Diagnosis not present

## 2024-03-22 DIAGNOSIS — Z923 Personal history of irradiation: Secondary | ICD-10-CM | POA: Insufficient documentation

## 2024-03-22 DIAGNOSIS — Z1721 Progesterone receptor positive status: Secondary | ICD-10-CM | POA: Diagnosis not present

## 2024-03-22 NOTE — Assessment & Plan Note (Signed)
 03/14/2024: Right lumpectomy: Grade 2 ILC 0.7 cm, multifocal with grade 2 DCIS, posterior margin positive for invasive cancer and DCIS, LVI not identified, ER 90%, PR 100%, HER2 0 negative, Ki-67 5%  Pathology counseling: I discussed the final pathology report of the patient provided  a copy of this report. I discussed the margins as well as lymph node surgeries. We also discussed the final staging along with previously performed ER/PR and HER-2/neu testing.  Recommendation: Reexcision for the positive margins Oncotype DX testing Adjuvant radiation Adjuvant antiestrogen therapy  Return to clinic after the Oncotype results are back.

## 2024-03-22 NOTE — Progress Notes (Signed)
 Patient Care Team: Ransom Byers, MD as PCP - General (Family Medicine) Auther Bo, RN as Oncology Nurse Navigator Alane Hsu, RN as Oncology Nurse Navigator Cameron Cea, MD as Consulting Physician (Hematology and Oncology)  DIAGNOSIS:  Encounter Diagnosis  Name Primary?   Malignant neoplasm of upper-inner quadrant of right breast in female, estrogen receptor positive (HCC) Yes    SUMMARY OF ONCOLOGIC HISTORY: Oncology History Overview Note  MSI- H MLH1 promoter hypermethylation is present  MMR IHC MLH1:          LOSS OF NUCLEAR EXPRESSION  MSH2:          Preserved nuclear expression  MSH6:          Preserved nuclear expression  PMS2:          LOSS OF NUCLEAR EXPRESSION   Endometrial cancer (HCC)  09/30/2021 Initial Biopsy   Hysteroscopy D&C, and MyoSure resection of anterior wall endometrial polyp (noted to be incomplete).  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   10/04/2021 Initial Diagnosis   Endometrial cancer (HCC)   10/09/2021 Imaging   CT A/P: IMPRESSION: Endometrial thickening measuring 10 mm, consistent with known history of endometrial carcinoma.   2 cm subserosal fibroid in anterior uterine fundus.   No evidence of metastatic disease within the abdomen or pelvis.   Colonic diverticulosis, without radiographic evidence of diverticulitis.   Mild hepatic steatosis.   Aortic Atherosclerosis   10/10/2021 Surgery   TRH/BSO, SLN biopsy, cysto, repair vaginal laceration  Findings: On EUA, small mobile uterus. On intra-abdminal entry, some adhesions of omentum to the anterior abdominal wall in the mid right abdomen, filmy.  Normal liver, diaphragm, stomach.  Normal small and large bowel.  Uterus approximately 8 cm in size with a 1-2 cm fundal left fibroid.  Ovaries unremarkable although larger than would expect in a postmenopausal patient.  Normal-appearing fallopian tubes although disrupted.  Clip  within the pelvis noted on bladder peritoneum.  Mapping successful bilaterally to external iliac sentinel lymph nodes.  No obvious evidence of extrauterine disease.  Right ovary adherent to the posterior uterus and broad ligament, rectum somewhat adherent posteriorly within the cul-de-sac.  Area of questionable deserosalization of the rectum was oversewn, negative bubble test.  Given significant adhesions of the bladder to the cervix, cystoscopy was performed, bladder and dome intact, good efflux noted from bilateral ureteral orifices.   10/10/2021 Pathology Results   A. SENTINEL LYMPH NODE, RIGHT EXTERNAL ILIAC, BIOPSY:  -  No carcinoma identified in one lymph node (0/1)  -  See comment   B. SENTINEL LYMPH NODE, LEFT EXTERNAL ILIAC, BIOPSY:  -  No carcinoma identified in one lymph node (0/1)  -  See comment   C. UTERUS, CERVIX, BILATERAL TUBES AND OVARIES:  Uterus:  -  Endometrioid carcinoma, FIGO grade 2, largely involving an  endometrial polyp  -  Benign endometrial polyp  -  Leiomyomata (2.2 cm; largest)  -  Adenomyosis  -  See oncology table and comment below   Cervix:  -  No carcinoma identified   Bilateral Ovaries:  -  No carcinoma identified   Bilateral Fallopian tubes:  -  No carcinoma identified   ONCOLOGY TABLE:   UTERUS, CARCINOMA OR CARCINOSARCOMA: Resection   Procedure: Total hysterectomy and bilateral salpingo-oophorectomy  Histologic Type: Endometrioid carcinoma, NOS  Histologic Grade: FIGO grade 2  Myometrial Invasion:       Depth of Myometrial Invasion (mm): 2  Myometrial Thickness (mm): 22       Percentage of Myometrial Invasion: Less than 50%  Uterine Serosa Involvement: Not identified  Cervical stromal Involvement: Not identified  Extent of involvement of other tissue/organs: Not identified  Peritoneal/Ascitic Fluid: Not submitted unknown  Lymphovascular Invasion: Not identified  Regional Lymph Nodes:       Pelvic Lymph Nodes Examined:  2  Sentinel                                0 non-sentinel                                   2 total       Pelvic Lymph Nodes with Metastasis: 0                           Macrometastasis: (>2.0 mm): 0                          Micrometastasis: (>0.2 mm and < 2.0 mm): 0                            Isolated Tumor Cells (<0.2 mm): 0                       Laterality of Lymph Node with Tumor: N/A                             Extracapsular Extension: N/A       Para-aortic Lymph Nodes Examined: N/A  Distant Metastasis:       Distant Site(s) Involved: N/A  Pathologic Stage Classification (pTNM, AJCC 8th Edition): pT1a, pN0  Ancillary Studies: MMR / MSI testing will be ordered  Representative Tumor Block: C6  Comment(s): See above   12/03/2021 - 01/02/2022 Radiation Therapy   12/03/2021 through 01/02/2022 Site Technique Total Dose (Gy) Dose per Fx (Gy) Completed Fx Beam Energies  Vagina: Pelvis HDR-brachy 30/30 6 5/5 Ir-192        CHIEF COMPLIANT: Follow-up after surgery to discuss treatment plan  HISTORY OF PRESENT ILLNESS:   History of Present Illness Becky Dudley is a 73 year old female with breast cancer who presents for postoperative follow-up after breast surgery. She is accompanied by her sister, Russ Course.  She underwent breast surgery for a 7 mm invasive breast cancer and precancerous tissue. Postoperatively, she experiences discomfort from her bra due to fluid buildup, but no pain is present.  The surgical pathology report shows positive posterior margins with no lymph or blood vessel invasion. Estrogen receptor is 90% positive, progesterone receptor is 100% positive, and HER2 is negative. The tumor growth rate is slow at 5%.  She is awaiting radiation therapy, expected to start four to five weeks post-surgery, with up to 20 sessions planned over three to four weeks.  Her family history includes breast cancer in her sisters and an aunt. She has been referred for genetic testing but has  not yet received an appointment.     ALLERGIES:  is allergic to lisinopril.  MEDICATIONS:  Current Outpatient Medications  Medication Sig Dispense Refill   amLODipine (NORVASC) 5 MG tablet TAKE 1 TABLET BY MOUTH ONCE  DAILY for 90     aspirin 81 MG chewable tablet Chew 81 mg by mouth daily.     BIOTIN MAXIMUM PO Take by mouth.     Cholecalciferol (VITAMIN D3) 125 MCG (5000 UT) TABS Take 5,000 Units by mouth daily.     rosuvastatin (CRESTOR) 5 MG tablet Take 5 mg by mouth at bedtime.     telmisartan (MICARDIS) 40 MG tablet Take 40 mg by mouth daily.     zinc gluconate 50 MG tablet Take 25 mg by mouth daily.     acetaminophen  (TYLENOL ) 325 MG tablet Take 650 mg by mouth every 6 (six) hours as needed for mild pain. (Patient not taking: Reported on 03/22/2024)     calcium carbonate (OSCAL) 1500 (600 Ca) MG TABS tablet Take 1,200 mg by mouth daily. (Patient not taking: Reported on 03/22/2024)     diclofenac Sodium (VOLTAREN) 1 % GEL Apply 2 g topically daily as needed (Arthritis). For knees (Patient not taking: Reported on 03/22/2024)     loratadine (CLARITIN) 10 MG tablet Take 10 mg by mouth daily as needed for allergies or rhinitis. (Patient not taking: Reported on 03/22/2024)     Menthol, Topical Analgesic, (BIOFREEZE ROLL-ON EX) Apply 1 application topically daily as needed (Back pain). (Patient not taking: Reported on 03/22/2024)     naphazoline-pheniramine (VISINE) 0.025-0.3 % ophthalmic solution 1 drop daily as needed (Dry eye). (Patient not taking: Reported on 03/22/2024)     oxyCODONE  (OXY IR/ROXICODONE ) 5 MG immediate release tablet Take 0.5-1 tablets (2.5-5 mg total) by mouth every 6 (six) hours as needed for severe pain (pain score 7-10). (Patient not taking: Reported on 03/22/2024) 5 tablet 0   Semaglutide, 1 MG/DOSE, (OZEMPIC, 1 MG/DOSE,) 2 MG/1.5ML SOPN Inject 1 mg into the skin once a week. Injection on Sundays. (Patient not taking: Reported on 03/09/2024)     sodium chloride  (OCEAN) 0.65  % SOLN nasal spray Place 1 spray into both nostrils as needed for congestion. (Patient not taking: Reported on 03/22/2024)     UNABLE TO FIND Apply 1 application topically daily as needed (Leg cramps). Hylands OTC for leg cramps (Patient not taking: Reported on 03/22/2024)     No current facility-administered medications for this visit.    PHYSICAL EXAMINATION: ECOG PERFORMANCE STATUS: 1 - Symptomatic but completely ambulatory  Vitals:   03/22/24 1251  BP: (!) 148/84  Pulse: 84  Resp: 18  Temp: 98.3 F (36.8 C)  SpO2: 97%   Filed Weights   03/22/24 1251  Weight: 213 lb 9.6 oz (96.9 kg)    LABORATORY DATA:  I have reviewed the data as listed    Latest Ref Rng & Units 03/08/2024    9:51 AM 10/16/2021   10:17 AM 10/10/2021   12:00 PM  CMP  Glucose 70 - 99 mg/dL 284  132  440   BUN 8 - 23 mg/dL 15  16  11    Creatinine 0.44 - 1.00 mg/dL 1.02  7.25  3.66   Sodium 135 - 145 mmol/L 143  144  138   Potassium 3.5 - 5.1 mmol/L 4.1  4.1  3.2   Chloride 98 - 111 mmol/L 110  110  103   CO2 22 - 32 mmol/L 25  29  28    Calcium 8.9 - 10.3 mg/dL 8.9  9.4  9.2     Lab Results  Component Value Date   WBC 7.6 10/09/2021   HGB 13.3 10/09/2021   HCT 40.0 10/09/2021  MCV 91.3 10/09/2021   PLT 275 10/09/2021    ASSESSMENT & PLAN:  Malignant neoplasm of upper-inner quadrant of right breast in female, estrogen receptor positive (HCC) 03/14/2024: Right lumpectomy: Grade 2 ILC 0.7 cm, multifocal with grade 2 DCIS, posterior margin positive for invasive cancer and DCIS, LVI not identified, ER 90%, PR 100%, HER2 0 negative, Ki-67 5%  Pathology counseling: I discussed the final pathology report of the patient provided  a copy of this report. I discussed the margins as well as lymph node surgeries. We also discussed the final staging along with previously performed ER/PR and HER-2/neu testing.  Recommendation: Posterior margin is pectoralis muscle and therefore there is no additional role of  surgery. Adjuvant radiation Adjuvant antiestrogen therapy  Return to clinic after radiation is completed ------------------------------------- Assessment and Plan Assessment & Plan Breast cancer, stage 1A Stage 1A breast cancer with a 7mm tumor, positive posterior margins. ER+ (90%), PR+ (100%), HER2-. Slow growth, no lymphovascular invasion, Grade 2. Radiation therapy recommended to reduce recurrence risk from 9.5% to 1%. Candidate for antiestrogen therapy post-radiation. - Schedule radiation therapy consultation with Dr. Gustabo Legions 4-5 weeks post-surgery. - Administer radiation therapy for 3-4 weeks, 5 days a week, totaling approximately 20 sessions. - Schedule follow-up appointment post-radiation to discuss antiestrogen therapy. - Arrange genetic testing appointment before leaving the clinic.      No orders of the defined types were placed in this encounter.  The patient has a good understanding of the overall plan. she agrees with it. she will call with any problems that may develop before the next visit here. Total time spent: 30 mins including face to face time and time spent for planning, charting and co-ordination of care   Viinay K Aquanetta Schwarz, MD 03/22/24

## 2024-03-24 ENCOUNTER — Encounter: Payer: Self-pay | Admitting: *Deleted

## 2024-03-31 ENCOUNTER — Inpatient Hospital Stay: Admitting: Licensed Clinical Social Worker

## 2024-03-31 NOTE — Progress Notes (Signed)
 [facility] Clinical Social Work  Initial Assessment   Becky Dudley is a 73 y.o. year old female contacted by phone. Clinical Social Work was referred by new patient protocol for assessment of psychosocial needs.   SDOH (Social Determinants of Health) assessments performed: Yes- reviewed from 03/22/24   SDOH Screenings   Food Insecurity: No Food Insecurity (03/22/2024)  Housing: Low Risk  (03/22/2024)  Transportation Needs: No Transportation Needs (03/22/2024)  Utilities: Not At Risk (03/22/2024)  Depression (PHQ2-9): Low Risk  (03/22/2024)  Tobacco Use: Low Risk  (03/14/2024)     Distress Screen completed: No   Family/Social Information:  Family members/support persons in your life? Family and Friends Transportation concerns: no  Employment: Working full time. Was on leave for surgery/recovery and has now returned  Income source: Employment Financial concerns: No Type of concern: None Food access concerns: no Religious or spiritual practice: Not known Services Currently in place:  [facility]  Coping/ Adjustment to diagnosis: Patient understands treatment plan and what happens next? yes, she is healing from surgery and will have radiation in July. She is figuring out what she will need to request in terms of hours/ flex time with work. She is doing well emotionally coping otherwise Concerns about diagnosis and/or treatment: timing with work and how her skin will react to radiation Hopes and/or priorities: hopes to continue working through treatment Current coping skills/ strengths: Ability for insight , Capable of independent living , Communication skills , and Supportive family/friends     SUMMARY: Current SDOH Barriers:  No major barriers identified today  Clinical Social Work Clinical Goal(s):  No clinical social work goals at this time  Interventions: Discussed common feeling and emotions when being diagnosed with cancer, and the importance of support during treatment Informed  patient of the support team roles and support services at [facility] Provided CSW contact information and encouraged patient to call with any questions or concerns    Follow Up Plan: Patient will contact CSW with any support or resource needs Patient verbalizes understanding of plan: Yes    Luisdavid Hamblin E Shandon Burlingame, LCSW Clinical Social Worker [facility]

## 2024-04-12 NOTE — Progress Notes (Signed)
 Radiation Oncology         [phone] ________________________________  Name: Becky Dudley MRN: [id number]  Date: 04/14/2024  DOB: 1950/11/22  Re-Evaluation Note  CC: [name], MD  Odean Potts, MD  No diagnosis found.  Diagnosis:   The primary encounter diagnosis was Malignant neoplasm of upper-inner quadrant of right breast in female, estrogen receptor positive (HCC). A diagnosis of Endometrial cancer [facility]) was also pertinent to this visit.    Malignant neoplasm of upper-inner quadrant of right female breast (ER +, PR +, Her2 -)     History of FIGO grade 2 endometrioid carcinoma; s/p hysterectomy and BSO, Stage pT1a, pN0; s/p hysterectomy and adjuvant radiation completed on 01/02/2022   Narrative:  The patient returns today to discuss radiation treatment options. She was last seen in office on 03/09/24 for a reconsultation visit.    In the interval since she was last seen, patient opted to proceed with a right breast lumpectomy with radioactive seed localization on 03/14/24. Surgical pathology from the procedure revealed: tumor size of 0.7 cm, histology of grade 2 multifocal invasive lobular carcinoma and intermediate grade cribriform and flat patterns DCIS with extensive necrosis and calcifications present. Margins, invasive: Positive, posterior margin. Closest margin 1 mm, medial margin. DCIS Margins: Positive, posterior margin; closest margin less than 1 mm, inferior margin; 1 mm, medial margin. Prognostic markers: ER Positive 90%, strong staining intensity , PR positive 100%, strong staining intensity , HER2 negative, Ki-67 5%.    During her post-op follow up on 04/04/24 patient reported recovering well from procedure but did endorse a burning sensation in the left posterior axilla that is very brief and comes on suddenly. Otherwise, patient is awaiting radiation therapy.   On review of systems, the patient reports ***. She denies *** and any other symptoms.     Allergies:  is allergic to lisinopril.  Meds: Current Outpatient Medications  Medication Sig Dispense Refill   acetaminophen  (TYLENOL ) 325 MG tablet Take 650 mg by mouth every 6 (six) hours as needed for mild pain. (Patient not taking: Reported on 03/22/2024)     amLODipine (NORVASC) 5 MG tablet TAKE 1 TABLET BY MOUTH ONCE  DAILY for 90     aspirin 81 MG chewable tablet Chew 81 mg by mouth daily.     BIOTIN MAXIMUM PO Take by mouth.     calcium carbonate (OSCAL) 1500 (600 Ca) MG TABS tablet Take 1,200 mg by mouth daily. (Patient not taking: Reported on 03/22/2024)     Cholecalciferol (VITAMIN D3) 125 MCG (5000 UT) TABS Take 5,000 Units by mouth daily.     diclofenac Sodium (VOLTAREN) 1 % GEL Apply 2 g topically daily as needed (Arthritis). For knees (Patient not taking: Reported on 03/22/2024)     loratadine (CLARITIN) 10 MG tablet Take 10 mg by mouth daily as needed for allergies or rhinitis. (Patient not taking: Reported on 03/22/2024)     Menthol, Topical Analgesic, (BIOFREEZE ROLL-ON EX) Apply 1 application topically daily as needed (Back pain). (Patient not taking: Reported on 03/22/2024)     naphazoline-pheniramine (VISINE) 0.025-0.3 % ophthalmic solution 1 drop daily as needed (Dry eye). (Patient not taking: Reported on 03/22/2024)     oxyCODONE  (OXY IR/ROXICODONE ) 5 MG immediate release tablet Take 0.5-1 tablets (2.5-5 mg total) by mouth every 6 (six) hours as needed for severe pain (pain score 7-10). (Patient not taking: Reported on 03/22/2024) 5 tablet 0   rosuvastatin (CRESTOR) 5 MG tablet Take 5 mg by mouth  at bedtime.     Semaglutide, 1 MG/DOSE, (OZEMPIC, 1 MG/DOSE,) 2 MG/1.5ML SOPN Inject 1 mg into the skin once a week. Injection on Sundays. (Patient not taking: Reported on 03/09/2024)     sodium chloride  (OCEAN) 0.65 % SOLN nasal spray Place 1 spray into both nostrils as needed for congestion. (Patient not taking: Reported on 03/22/2024)     telmisartan (MICARDIS) 40 MG tablet Take 40  mg by mouth daily.     UNABLE TO FIND Apply 1 application topically daily as needed (Leg cramps). Hylands OTC for leg cramps (Patient not taking: Reported on 03/22/2024)     zinc gluconate 50 MG tablet Take 25 mg by mouth daily.     No current facility-administered medications for this encounter.    Physical Findings: The patient is in no acute distress. Patient is alert and oriented.  vitals were not taken for this visit.  No significant changes. Lungs are clear to auscultation bilaterally. Heart has regular rate and rhythm. No palpable cervical, supraclavicular, or axillary adenopathy. Abdomen soft, non-tender, normal bowel sounds. *** Breast: no palpable mass, nipple discharge or bleeding. *** Breast: ***  Lab Findings: Lab Results  Component Value Date   WBC 7.6 10/09/2021   HGB 13.3 10/09/2021   HCT 40.0 10/09/2021   MCV 91.3 10/09/2021   PLT 275 10/09/2021    Radiographic Findings: MM Breast Surgical Specimen Result Date: 03/14/2024 CLINICAL DATA:  Post lumpectomy specimen radiograph EXAM: SPECIMEN RADIOGRAPH OF THE RIGHT BREAST COMPARISON:  Previous exam(s). FINDINGS: Status post excision of the right breast. The radioactive seed and two biopsy marker clips are present and completely intact. IMPRESSION: Specimen radiograph of the right breast. Electronically Signed   By: Inocente Ast M.D.   On: 03/14/2024 12:34    Impression:  Malignant neoplasm of upper-inner quadrant of right female breast (ER +, PR +, Her2 -)     History of FIGO grade 2 endometrioid carcinoma; s/p hysterectomy and BSO, Stage pT1a, pN0; s/p hysterectomy and adjuvant radiation completed on 01/02/2022   ***  Plan:  Patient is scheduled for CT simulation {date/later today}. ***  -----------------------------------  Lynwood CHARM Nasuti, PhD, MD   This document serves as a record of services personally performed by Lynwood Nasuti, MD. It was created on his behalf by Reymundo Cartwright, a trained medical scribe. The  creation of this record is based on the scribe's personal observations and the provider's statements to them. This document has been checked and approved by the attending provider.

## 2024-04-13 NOTE — Progress Notes (Signed)
 Location of Breast Cancer:right upper inner quadrant  Histology per Pathology Report:    Receptor Status: ER(90), PR (100), Her2-neu (negative), Ki-67(5%)  Did patient present with symptoms (if so, please note symptoms) or was this found on screening mammography?: Mammogram  Past/Anticipated interventions by surgeon, if jwb:mphyu    Past/Anticipated interventions by medical oncology, if any: Chemotherapy Dr. Odean   Lymphedema issues, if any:  {:18581} {t:21944}   Pain issues, if any:  {:18581} {PAIN DESCRIPTION:21022940}  SAFETY ISSUES: Prior radiation? Yes,endometrial 12/03/21-01/02/22 Pacemaker/ICD? {:18581} Possible current pregnancy?{:18581} Is the patient on methotrexate? {:18581}  Current Complaints / other details:  ***

## 2024-04-14 ENCOUNTER — Ambulatory Visit
Admission: RE | Admit: 2024-04-14 | Discharge: 2024-04-14 | Disposition: A | Discharge status: Home / Self Care | Source: Ambulatory Visit | Attending: Radiation Oncology | Admitting: Radiation Oncology

## 2024-04-14 ENCOUNTER — Encounter: Payer: Self-pay | Admitting: Radiation Oncology

## 2024-04-14 VITALS — BP 154/88 | Temp 97.6°F | Resp 20 | Wt 215.0 lb

## 2024-04-14 VITALS — BP 154/88 | HR 97 | Temp 97.6°F | Resp 20 | Wt 215.0 lb

## 2024-04-14 DIAGNOSIS — C50111 Malignant neoplasm of central portion of right female breast: Secondary | ICD-10-CM | POA: Insufficient documentation

## 2024-04-14 DIAGNOSIS — Z79899 Other long term (current) drug therapy: Secondary | ICD-10-CM | POA: Insufficient documentation

## 2024-04-14 DIAGNOSIS — C50211 Malignant neoplasm of upper-inner quadrant of right female breast: Secondary | ICD-10-CM | POA: Diagnosis not present

## 2024-04-14 DIAGNOSIS — Z1732 Human epidermal growth factor receptor 2 negative status: Secondary | ICD-10-CM | POA: Diagnosis not present

## 2024-04-14 DIAGNOSIS — Z1721 Progesterone receptor positive status: Secondary | ICD-10-CM | POA: Insufficient documentation

## 2024-04-14 DIAGNOSIS — Z923 Personal history of irradiation: Secondary | ICD-10-CM | POA: Diagnosis not present

## 2024-04-14 DIAGNOSIS — Z8542 Personal history of malignant neoplasm of other parts of uterus: Secondary | ICD-10-CM | POA: Diagnosis not present

## 2024-04-14 DIAGNOSIS — Z170 Estrogen receptor positive status [ER+]: Secondary | ICD-10-CM | POA: Diagnosis not present

## 2024-04-14 DIAGNOSIS — C541 Malignant neoplasm of endometrium: Secondary | ICD-10-CM

## 2024-04-14 DIAGNOSIS — Z510 Encounter for antineoplastic radiation therapy: Secondary | ICD-10-CM | POA: Diagnosis present

## 2024-04-14 DIAGNOSIS — Z7982 Long term (current) use of aspirin: Secondary | ICD-10-CM | POA: Insufficient documentation

## 2024-04-21 ENCOUNTER — Encounter: Payer: Self-pay | Admitting: *Deleted

## 2024-04-21 DIAGNOSIS — C50211 Malignant neoplasm of upper-inner quadrant of right female breast: Secondary | ICD-10-CM

## 2024-04-26 DIAGNOSIS — Z510 Encounter for antineoplastic radiation therapy: Secondary | ICD-10-CM | POA: Diagnosis not present

## 2024-04-28 ENCOUNTER — Other Ambulatory Visit: Payer: Self-pay

## 2024-04-28 ENCOUNTER — Ambulatory Visit
Admission: RE | Admit: 2024-04-28 | Discharge: 2024-04-28 | Disposition: A | Discharge status: Home / Self Care | Source: Ambulatory Visit | Attending: Radiation Oncology | Admitting: Radiation Oncology

## 2024-04-28 DIAGNOSIS — Z510 Encounter for antineoplastic radiation therapy: Secondary | ICD-10-CM | POA: Diagnosis not present

## 2024-04-28 DIAGNOSIS — C541 Malignant neoplasm of endometrium: Secondary | ICD-10-CM

## 2024-04-28 LAB — RAD ONC ARIA SESSION SUMMARY
Course Elapsed Days: 0
Plan Fractions Treated to Date: 1
Plan Prescribed Dose Per Fraction: 2.67 Gy
Plan Total Fractions Prescribed: 15
Plan Total Prescribed Dose: 40.05 Gy
Reference Point Dosage Given to Date: 2.67 Gy
Reference Point Session Dosage Given: 2.67 Gy
Session Number: 1

## 2024-04-29 ENCOUNTER — Other Ambulatory Visit: Payer: Self-pay

## 2024-04-29 ENCOUNTER — Ambulatory Visit
Admission: RE | Admit: 2024-04-29 | Discharge: 2024-04-29 | Disposition: A | Discharge status: Home / Self Care | Source: Ambulatory Visit | Attending: Radiation Oncology | Admitting: Radiation Oncology

## 2024-04-29 DIAGNOSIS — Z510 Encounter for antineoplastic radiation therapy: Secondary | ICD-10-CM | POA: Diagnosis not present

## 2024-04-29 LAB — RAD ONC ARIA SESSION SUMMARY
Course Elapsed Days: 1
Plan Fractions Treated to Date: 2
Plan Prescribed Dose Per Fraction: 2.67 Gy
Plan Total Fractions Prescribed: 15
Plan Total Prescribed Dose: 40.05 Gy
Reference Point Dosage Given to Date: 5.34 Gy
Reference Point Session Dosage Given: 2.67 Gy
Session Number: 2

## 2024-05-02 ENCOUNTER — Ambulatory Visit
Admission: RE | Admit: 2024-05-02 | Discharge: 2024-05-02 | Disposition: A | Discharge status: Home / Self Care | Source: Ambulatory Visit | Attending: Radiation Oncology | Admitting: Radiation Oncology

## 2024-05-02 ENCOUNTER — Other Ambulatory Visit: Payer: Self-pay

## 2024-05-02 DIAGNOSIS — Z510 Encounter for antineoplastic radiation therapy: Secondary | ICD-10-CM | POA: Diagnosis not present

## 2024-05-02 LAB — RAD ONC ARIA SESSION SUMMARY
Course Elapsed Days: 4
Plan Fractions Treated to Date: 3
Plan Prescribed Dose Per Fraction: 2.67 Gy
Plan Total Fractions Prescribed: 15
Plan Total Prescribed Dose: 40.05 Gy
Reference Point Dosage Given to Date: 8.01 Gy
Reference Point Session Dosage Given: 2.67 Gy
Session Number: 3

## 2024-05-03 ENCOUNTER — Ambulatory Visit
Admission: RE | Admit: 2024-05-03 | Discharge: 2024-05-03 | Disposition: A | Discharge status: Home / Self Care | Source: Ambulatory Visit | Attending: Radiation Oncology | Admitting: Radiation Oncology

## 2024-05-03 ENCOUNTER — Other Ambulatory Visit: Payer: Self-pay

## 2024-05-03 ENCOUNTER — Ambulatory Visit
Admission: RE | Admit: 2024-05-03 | Discharge: 2024-05-03 | Discharge status: Home / Self Care | Source: Ambulatory Visit | Attending: Radiation Oncology | Admitting: Radiation Oncology

## 2024-05-03 DIAGNOSIS — Z510 Encounter for antineoplastic radiation therapy: Secondary | ICD-10-CM | POA: Diagnosis not present

## 2024-05-03 DIAGNOSIS — C50211 Malignant neoplasm of upper-inner quadrant of right female breast: Secondary | ICD-10-CM

## 2024-05-03 LAB — RAD ONC ARIA SESSION SUMMARY
Course Elapsed Days: 5
Plan Fractions Treated to Date: 4
Plan Prescribed Dose Per Fraction: 2.67 Gy
Plan Total Fractions Prescribed: 15
Plan Total Prescribed Dose: 40.05 Gy
Reference Point Dosage Given to Date: 10.68 Gy
Reference Point Session Dosage Given: 2.67 Gy
Session Number: 4

## 2024-05-03 MED ORDER — ALRA NON-METALLIC DEODORANT (RAD-ONC)
1.0000 | Freq: Once | TOPICAL | Status: AC
  Administered 2024-05-03: 1 via TOPICAL

## 2024-05-03 MED ORDER — RADIAPLEXRX EX GEL: Freq: Once | CUTANEOUS | Status: AC

## 2024-05-04 ENCOUNTER — Ambulatory Visit

## 2024-05-05 ENCOUNTER — Ambulatory Visit
Admission: RE | Admit: 2024-05-05 | Discharge: 2024-05-05 | Disposition: A | Discharge status: Home / Self Care | Source: Ambulatory Visit | Attending: Radiation Oncology | Admitting: Radiation Oncology

## 2024-05-05 ENCOUNTER — Other Ambulatory Visit: Payer: Self-pay

## 2024-05-05 DIAGNOSIS — Z510 Encounter for antineoplastic radiation therapy: Secondary | ICD-10-CM | POA: Diagnosis not present

## 2024-05-05 LAB — RAD ONC ARIA SESSION SUMMARY
Course Elapsed Days: 7
Plan Fractions Treated to Date: 5
Plan Prescribed Dose Per Fraction: 2.67 Gy
Plan Total Fractions Prescribed: 15
Plan Total Prescribed Dose: 40.05 Gy
Reference Point Dosage Given to Date: 13.35 Gy
Reference Point Session Dosage Given: 2.67 Gy
Session Number: 5

## 2024-05-06 ENCOUNTER — Ambulatory Visit
Admission: RE | Admit: 2024-05-06 | Discharge: 2024-05-06 | Disposition: A | Discharge status: Home / Self Care | Source: Ambulatory Visit | Attending: Radiation Oncology | Admitting: Radiation Oncology

## 2024-05-06 ENCOUNTER — Other Ambulatory Visit: Payer: Self-pay

## 2024-05-06 DIAGNOSIS — Z510 Encounter for antineoplastic radiation therapy: Secondary | ICD-10-CM | POA: Diagnosis not present

## 2024-05-06 LAB — RAD ONC ARIA SESSION SUMMARY
Course Elapsed Days: 8
Plan Fractions Treated to Date: 6
Plan Prescribed Dose Per Fraction: 2.67 Gy
Plan Total Fractions Prescribed: 15
Plan Total Prescribed Dose: 40.05 Gy
Reference Point Dosage Given to Date: 16.02 Gy
Reference Point Session Dosage Given: 2.67 Gy
Session Number: 6

## 2024-05-09 ENCOUNTER — Ambulatory Visit
Admission: RE | Admit: 2024-05-09 | Discharge: 2024-05-09 | Disposition: A | Discharge status: Home / Self Care | Source: Ambulatory Visit | Attending: Radiation Oncology | Admitting: Radiation Oncology

## 2024-05-09 ENCOUNTER — Other Ambulatory Visit: Payer: Self-pay

## 2024-05-09 DIAGNOSIS — Z510 Encounter for antineoplastic radiation therapy: Secondary | ICD-10-CM | POA: Diagnosis not present

## 2024-05-09 LAB — RAD ONC ARIA SESSION SUMMARY
Course Elapsed Days: 11
Plan Fractions Treated to Date: 7
Plan Prescribed Dose Per Fraction: 2.67 Gy
Plan Total Fractions Prescribed: 15
Plan Total Prescribed Dose: 40.05 Gy
Reference Point Dosage Given to Date: 18.69 Gy
Reference Point Session Dosage Given: 2.67 Gy
Session Number: 7

## 2024-05-10 ENCOUNTER — Other Ambulatory Visit: Payer: Self-pay

## 2024-05-10 ENCOUNTER — Ambulatory Visit
Admission: RE | Admit: 2024-05-10 | Discharge: 2024-05-10 | Disposition: A | Discharge status: Home / Self Care | Source: Ambulatory Visit | Attending: Radiation Oncology | Admitting: Radiation Oncology

## 2024-05-10 DIAGNOSIS — Z510 Encounter for antineoplastic radiation therapy: Secondary | ICD-10-CM | POA: Diagnosis not present

## 2024-05-10 LAB — RAD ONC ARIA SESSION SUMMARY
Course Elapsed Days: 12
Plan Fractions Treated to Date: 8
Plan Prescribed Dose Per Fraction: 2.67 Gy
Plan Total Fractions Prescribed: 15
Plan Total Prescribed Dose: 40.05 Gy
Reference Point Dosage Given to Date: 21.36 Gy
Reference Point Session Dosage Given: 2.67 Gy
Session Number: 8

## 2024-05-11 ENCOUNTER — Other Ambulatory Visit: Payer: Self-pay

## 2024-05-11 ENCOUNTER — Ambulatory Visit
Admission: RE | Admit: 2024-05-11 | Discharge: 2024-05-11 | Disposition: A | Discharge status: Home / Self Care | Source: Ambulatory Visit | Attending: Radiation Oncology | Admitting: Radiation Oncology

## 2024-05-11 DIAGNOSIS — Z510 Encounter for antineoplastic radiation therapy: Secondary | ICD-10-CM | POA: Diagnosis not present

## 2024-05-11 LAB — RAD ONC ARIA SESSION SUMMARY
Course Elapsed Days: 13
Plan Fractions Treated to Date: 9
Plan Prescribed Dose Per Fraction: 2.67 Gy
Plan Total Fractions Prescribed: 15
Plan Total Prescribed Dose: 40.05 Gy
Reference Point Dosage Given to Date: 24.03 Gy
Reference Point Session Dosage Given: 2.67 Gy
Session Number: 9

## 2024-05-12 ENCOUNTER — Other Ambulatory Visit: Payer: Self-pay

## 2024-05-12 ENCOUNTER — Ambulatory Visit
Admission: RE | Admit: 2024-05-12 | Discharge: 2024-05-12 | Disposition: A | Discharge status: Home / Self Care | Source: Ambulatory Visit | Attending: Radiation Oncology | Admitting: Radiation Oncology

## 2024-05-12 DIAGNOSIS — Z510 Encounter for antineoplastic radiation therapy: Secondary | ICD-10-CM | POA: Diagnosis not present

## 2024-05-12 LAB — RAD ONC ARIA SESSION SUMMARY
Course Elapsed Days: 14
Plan Fractions Treated to Date: 10
Plan Prescribed Dose Per Fraction: 2.67 Gy
Plan Total Fractions Prescribed: 15
Plan Total Prescribed Dose: 40.05 Gy
Reference Point Dosage Given to Date: 26.7 Gy
Reference Point Session Dosage Given: 2.67 Gy
Session Number: 10

## 2024-05-13 ENCOUNTER — Other Ambulatory Visit: Payer: Self-pay

## 2024-05-13 ENCOUNTER — Ambulatory Visit
Admission: RE | Admit: 2024-05-13 | Discharge: 2024-05-13 | Disposition: A | Discharge status: Home / Self Care | Source: Ambulatory Visit | Attending: Radiation Oncology | Admitting: Radiation Oncology

## 2024-05-13 DIAGNOSIS — Z170 Estrogen receptor positive status [ER+]: Secondary | ICD-10-CM | POA: Diagnosis not present

## 2024-05-13 DIAGNOSIS — Z510 Encounter for antineoplastic radiation therapy: Secondary | ICD-10-CM | POA: Diagnosis present

## 2024-05-13 DIAGNOSIS — Z8542 Personal history of malignant neoplasm of other parts of uterus: Secondary | ICD-10-CM | POA: Diagnosis not present

## 2024-05-13 DIAGNOSIS — C50211 Malignant neoplasm of upper-inner quadrant of right female breast: Secondary | ICD-10-CM | POA: Diagnosis not present

## 2024-05-13 DIAGNOSIS — Z79811 Long term (current) use of aromatase inhibitors: Secondary | ICD-10-CM | POA: Diagnosis not present

## 2024-05-13 DIAGNOSIS — Z1721 Progesterone receptor positive status: Secondary | ICD-10-CM | POA: Diagnosis not present

## 2024-05-13 DIAGNOSIS — Z1732 Human epidermal growth factor receptor 2 negative status: Secondary | ICD-10-CM | POA: Diagnosis not present

## 2024-05-13 LAB — RAD ONC ARIA SESSION SUMMARY
Course Elapsed Days: 15
Plan Fractions Treated to Date: 11
Plan Prescribed Dose Per Fraction: 2.67 Gy
Plan Total Fractions Prescribed: 15
Plan Total Prescribed Dose: 40.05 Gy
Reference Point Dosage Given to Date: 29.37 Gy
Reference Point Session Dosage Given: 2.67 Gy
Session Number: 11

## 2024-05-16 ENCOUNTER — Ambulatory Visit
Admission: RE | Admit: 2024-05-16 | Discharge: 2024-05-16 | Disposition: A | Discharge status: Home / Self Care | Source: Ambulatory Visit | Attending: Radiation Oncology | Admitting: Radiation Oncology

## 2024-05-16 ENCOUNTER — Other Ambulatory Visit: Payer: Self-pay

## 2024-05-16 DIAGNOSIS — Z510 Encounter for antineoplastic radiation therapy: Secondary | ICD-10-CM | POA: Diagnosis not present

## 2024-05-16 LAB — RAD ONC ARIA SESSION SUMMARY
Course Elapsed Days: 18
Plan Fractions Treated to Date: 12
Plan Prescribed Dose Per Fraction: 2.67 Gy
Plan Total Fractions Prescribed: 15
Plan Total Prescribed Dose: 40.05 Gy
Reference Point Dosage Given to Date: 32.04 Gy
Reference Point Session Dosage Given: 2.67 Gy
Session Number: 12

## 2024-05-17 ENCOUNTER — Ambulatory Visit: Admitting: Radiation Oncology

## 2024-05-17 ENCOUNTER — Ambulatory Visit
Admission: RE | Admit: 2024-05-17 | Discharge: 2024-05-17 | Disposition: A | Discharge status: Home / Self Care | Source: Ambulatory Visit | Attending: Radiation Oncology | Admitting: Radiation Oncology

## 2024-05-17 ENCOUNTER — Other Ambulatory Visit: Payer: Self-pay

## 2024-05-17 DIAGNOSIS — Z510 Encounter for antineoplastic radiation therapy: Secondary | ICD-10-CM | POA: Diagnosis not present

## 2024-05-17 LAB — RAD ONC ARIA SESSION SUMMARY
Course Elapsed Days: 19
Plan Fractions Treated to Date: 13
Plan Prescribed Dose Per Fraction: 2.67 Gy
Plan Total Fractions Prescribed: 15
Plan Total Prescribed Dose: 40.05 Gy
Reference Point Dosage Given to Date: 34.71 Gy
Reference Point Session Dosage Given: 2.67 Gy
Session Number: 13

## 2024-05-18 ENCOUNTER — Other Ambulatory Visit: Payer: Self-pay

## 2024-05-18 ENCOUNTER — Ambulatory Visit
Admission: RE | Admit: 2024-05-18 | Discharge: 2024-05-18 | Disposition: A | Discharge status: Home / Self Care | Source: Ambulatory Visit | Attending: Radiation Oncology | Admitting: Radiation Oncology

## 2024-05-18 DIAGNOSIS — Z510 Encounter for antineoplastic radiation therapy: Secondary | ICD-10-CM | POA: Diagnosis not present

## 2024-05-18 LAB — RAD ONC ARIA SESSION SUMMARY
Course Elapsed Days: 20
Plan Fractions Treated to Date: 14
Plan Prescribed Dose Per Fraction: 2.67 Gy
Plan Total Fractions Prescribed: 15
Plan Total Prescribed Dose: 40.05 Gy
Reference Point Dosage Given to Date: 37.38 Gy
Reference Point Session Dosage Given: 2.67 Gy
Session Number: 14

## 2024-05-19 ENCOUNTER — Other Ambulatory Visit: Payer: Self-pay

## 2024-05-19 ENCOUNTER — Ambulatory Visit

## 2024-05-19 DIAGNOSIS — Z510 Encounter for antineoplastic radiation therapy: Secondary | ICD-10-CM | POA: Diagnosis not present

## 2024-05-19 LAB — RAD ONC ARIA SESSION SUMMARY
Course Elapsed Days: 21
Plan Fractions Treated to Date: 15
Plan Prescribed Dose Per Fraction: 2.67 Gy
Plan Total Fractions Prescribed: 15
Plan Total Prescribed Dose: 40.05 Gy
Reference Point Dosage Given to Date: 40.05 Gy
Reference Point Session Dosage Given: 2.67 Gy
Session Number: 15

## 2024-05-20 ENCOUNTER — Other Ambulatory Visit: Payer: Self-pay

## 2024-05-20 ENCOUNTER — Ambulatory Visit

## 2024-05-20 ENCOUNTER — Ambulatory Visit
Admission: RE | Admit: 2024-05-20 | Discharge: 2024-05-20 | Disposition: A | Discharge status: Home / Self Care | Source: Ambulatory Visit | Attending: Radiation Oncology | Admitting: Radiation Oncology

## 2024-05-20 DIAGNOSIS — Z510 Encounter for antineoplastic radiation therapy: Secondary | ICD-10-CM | POA: Diagnosis not present

## 2024-05-20 LAB — RAD ONC ARIA SESSION SUMMARY
Course Elapsed Days: 22
Plan Fractions Treated to Date: 1
Plan Prescribed Dose Per Fraction: 2.3 Gy
Plan Total Fractions Prescribed: 6
Plan Total Prescribed Dose: 13.8 Gy
Reference Point Dosage Given to Date: 2.3 Gy
Reference Point Session Dosage Given: 2.3 Gy
Session Number: 16

## 2024-05-23 ENCOUNTER — Other Ambulatory Visit: Payer: Self-pay

## 2024-05-23 ENCOUNTER — Ambulatory Visit
Admission: RE | Admit: 2024-05-23 | Discharge: 2024-05-23 | Disposition: A | Discharge status: Home / Self Care | Source: Ambulatory Visit | Attending: Radiation Oncology | Admitting: Radiation Oncology

## 2024-05-23 DIAGNOSIS — Z510 Encounter for antineoplastic radiation therapy: Secondary | ICD-10-CM | POA: Diagnosis not present

## 2024-05-23 LAB — RAD ONC ARIA SESSION SUMMARY
Course Elapsed Days: 25
Plan Fractions Treated to Date: 2
Plan Prescribed Dose Per Fraction: 2.3 Gy
Plan Total Fractions Prescribed: 6
Plan Total Prescribed Dose: 13.8 Gy
Reference Point Dosage Given to Date: 4.6 Gy
Reference Point Session Dosage Given: 2.3 Gy
Session Number: 17

## 2024-05-24 ENCOUNTER — Inpatient Hospital Stay (HOSPITAL_BASED_OUTPATIENT_CLINIC_OR_DEPARTMENT_OTHER): Admitting: Hematology and Oncology

## 2024-05-24 ENCOUNTER — Encounter: Payer: Self-pay | Admitting: Genetic Counselor

## 2024-05-24 ENCOUNTER — Inpatient Hospital Stay

## 2024-05-24 ENCOUNTER — Ambulatory Visit
Admission: RE | Admit: 2024-05-24 | Discharge: 2024-05-24 | Disposition: A | Discharge status: Home / Self Care | Source: Ambulatory Visit | Attending: Radiation Oncology | Admitting: Radiation Oncology

## 2024-05-24 ENCOUNTER — Inpatient Hospital Stay: Attending: Hematology and Oncology | Admitting: Genetic Counselor

## 2024-05-24 ENCOUNTER — Other Ambulatory Visit: Payer: Self-pay

## 2024-05-24 VITALS — BP 143/57 | HR 70 | Temp 98.3°F | Resp 16 | Ht 64.0 in | Wt 217.6 lb

## 2024-05-24 DIAGNOSIS — Z170 Estrogen receptor positive status [ER+]: Secondary | ICD-10-CM | POA: Diagnosis not present

## 2024-05-24 DIAGNOSIS — C541 Malignant neoplasm of endometrium: Secondary | ICD-10-CM

## 2024-05-24 DIAGNOSIS — Z1721 Progesterone receptor positive status: Secondary | ICD-10-CM

## 2024-05-24 DIAGNOSIS — Z79811 Long term (current) use of aromatase inhibitors: Secondary | ICD-10-CM | POA: Insufficient documentation

## 2024-05-24 DIAGNOSIS — C50211 Malignant neoplasm of upper-inner quadrant of right female breast: Secondary | ICD-10-CM

## 2024-05-24 DIAGNOSIS — Z8542 Personal history of malignant neoplasm of other parts of uterus: Secondary | ICD-10-CM | POA: Insufficient documentation

## 2024-05-24 DIAGNOSIS — Z8049 Family history of malignant neoplasm of other genital organs: Secondary | ICD-10-CM

## 2024-05-24 DIAGNOSIS — Z803 Family history of malignant neoplasm of breast: Secondary | ICD-10-CM

## 2024-05-24 DIAGNOSIS — Z510 Encounter for antineoplastic radiation therapy: Secondary | ICD-10-CM | POA: Insufficient documentation

## 2024-05-24 DIAGNOSIS — Z1732 Human epidermal growth factor receptor 2 negative status: Secondary | ICD-10-CM

## 2024-05-24 LAB — RAD ONC ARIA SESSION SUMMARY
Course Elapsed Days: 26
Plan Fractions Treated to Date: 3
Plan Prescribed Dose Per Fraction: 2.3 Gy
Plan Total Fractions Prescribed: 6
Plan Total Prescribed Dose: 13.8 Gy
Reference Point Dosage Given to Date: 6.9 Gy
Reference Point Session Dosage Given: 2.3 Gy
Session Number: 18

## 2024-05-24 LAB — GENETIC SCREENING ORDER

## 2024-05-24 MED ORDER — ANASTROZOLE 1 MG PO TABS: 1.0000 mg | ORAL_TABLET | Freq: Every day | ORAL | 3 refills | Status: AC

## 2024-05-24 NOTE — Assessment & Plan Note (Signed)
 03/14/2024: Right lumpectomy: Grade 2 ILC 0.7 cm, multifocal with grade 2 DCIS, posterior margin positive for invasive cancer (pectoralis muscle) and DCIS, LVI not identified, ER 90%, PR 100%, HER2 0 negative, Ki-67 5%  04/29/2024-05/27/2024: Adjuvant radiation  Treatment plan: Antiestrogen therapy with letrozole 1 mg daily x 5-7 years Anastrozole  counseling: We discussed the risks and benefits of anti-estrogen therapy with aromatase inhibitors. These include but not limited to insomnia, hot flashes, mood changes, vaginal dryness, bone density loss, and weight gain. We strongly believe that the benefits far outweigh the risks. Patient understands these risks and consented to starting treatment. Planned treatment duration is 5-7 years.  Return to clinic in 3 months for survivorship care plan visit

## 2024-05-24 NOTE — Progress Notes (Signed)
 REFERRING PROVIDER: Aron Shoulders, MD   PRIMARY PROVIDER:  [name], MD  PRIMARY REASON FOR VISIT:  1. Malignant neoplasm of upper-inner quadrant of right breast in female, estrogen receptor positive (HCC)   2. Endometrial cancer (HCC)   3. Family history of malignant neoplasm of breast   4. Family history of malignant neoplasm of genital organ     HISTORY OF PRESENT ILLNESS:   Ms. Beckstrom, a 73 y.o. female, was seen for a [place] cancer genetics consultation at the request of Aron Shoulders, MD due to a personal and family history of cancer.  Ms. Bucaro presents to clinic today to discuss the possibility of a hereditary predisposition to cancer, to discuss genetic testing, and to further clarify her future cancer risks, as well as potential cancer risks for family members.   Ms. 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 Pathology noted MSI-H, loss of MLH1/PMS2, MLH1 hypermethylation present. She was treated with a TRH/BSO and radiation. Ms. Marquina was diagnosed with breast cancer at age 56, ILC ER+, PR+, Her2-. She was treated with a lumpectomy 03/2024 and is currently being treated with radiation.    CANCER HISTORY:  Oncology History Overview Note  MSI- H MLH1 promoter hypermethylation is present  MMR IHC MLH1:          LOSS OF NUCLEAR EXPRESSION  MSH2:          Preserved nuclear expression  MSH6:          Preserved nuclear expression  PMS2:          LOSS OF NUCLEAR EXPRESSION   Endometrial cancer (HCC)  09/30/2021 Initial Biopsy   Hysteroscopy D&C, and MyoSure resection of anterior wall endometrial polyp (noted to be incomplete).  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   10/04/2021 Initial Diagnosis   Endometrial cancer (HCC)   10/09/2021 Imaging   CT A/P: IMPRESSION: Endometrial thickening measuring 10 mm, consistent with known history of endometrial carcinoma.   2 cm  subserosal fibroid in anterior uterine fundus.   No evidence of metastatic disease within the abdomen or pelvis.   Colonic diverticulosis, without radiographic evidence of diverticulitis.   Mild hepatic steatosis.   Aortic Atherosclerosis   10/10/2021 Surgery   TRH/BSO, SLN biopsy, cysto, repair vaginal laceration  Findings: On EUA, small mobile uterus. On intra-abdminal entry, some adhesions of omentum to the anterior abdominal wall in the mid right abdomen, filmy.  Normal liver, diaphragm, stomach.  Normal small and large bowel.  Uterus approximately 8 cm in size with a 1-2 cm fundal left fibroid.  Ovaries unremarkable although larger than would expect in a postmenopausal patient.  Normal-appearing fallopian tubes although disrupted.  Clip within the pelvis noted on bladder peritoneum.  Mapping successful bilaterally to external iliac sentinel lymph nodes.  No obvious evidence of extrauterine disease.  Right ovary adherent to the posterior uterus and broad ligament, rectum somewhat adherent posteriorly within the cul-de-sac.  Area of questionable deserosalization of the rectum was oversewn, negative bubble test.  Given significant adhesions of the bladder to the cervix, cystoscopy was performed, bladder and dome intact, good efflux noted from bilateral ureteral orifices.   10/10/2021 Pathology Results   A. SENTINEL LYMPH NODE, RIGHT EXTERNAL ILIAC, BIOPSY:  -  No carcinoma identified in one lymph node (0/1)  -  See comment   B. SENTINEL LYMPH NODE, LEFT EXTERNAL ILIAC, BIOPSY:  -  No carcinoma identified in one lymph node (0/1)  -  See comment   C. UTERUS, CERVIX, BILATERAL TUBES AND OVARIES:  Uterus:  -  Endometrioid carcinoma, FIGO grade 2, largely involving an  endometrial polyp  -  Benign endometrial polyp  -  Leiomyomata (2.2 cm; largest)  -  Adenomyosis  -  See oncology table and comment below   Cervix:  -  No carcinoma identified   Bilateral Ovaries:  -  No carcinoma  identified   Bilateral Fallopian tubes:  -  No carcinoma identified   ONCOLOGY TABLE:   UTERUS, CARCINOMA OR CARCINOSARCOMA: Resection   Procedure: Total hysterectomy and bilateral salpingo-oophorectomy  Histologic Type: Endometrioid carcinoma, NOS  Histologic Grade: FIGO grade 2  Myometrial Invasion:       Depth of Myometrial Invasion (mm): 2       Myometrial Thickness (mm): 22       Percentage of Myometrial Invasion: Less than 50%  Uterine Serosa Involvement: Not identified  Cervical stromal Involvement: Not identified  Extent of involvement of other tissue/organs: Not identified  Peritoneal/Ascitic Fluid: Not submitted unknown  Lymphovascular Invasion: Not identified  Regional Lymph Nodes:       Pelvic Lymph Nodes Examined:  2 Sentinel                                0 non-sentinel                                   2 total       Pelvic Lymph Nodes with Metastasis: 0                           Macrometastasis: (>2.0 mm): 0                          Micrometastasis: (>0.2 mm and < 2.0 mm): 0                            Isolated Tumor Cells (<0.2 mm): 0                       Laterality of Lymph Node with Tumor: N/A                             Extracapsular Extension: N/A       Para-aortic Lymph Nodes Examined: N/A  Distant Metastasis:       Distant Site(s) Involved: N/A  Pathologic Stage Classification (pTNM, AJCC 8th Edition): pT1a, pN0  Ancillary Studies: MMR / MSI testing will be ordered  Representative Tumor Block: C6  Comment(s): See above   12/03/2021 - 01/02/2022 Radiation Therapy   12/03/2021 through 01/02/2022 Site Technique Total Dose (Gy) Dose per Fx (Gy) Completed Fx Beam Energies  Vagina: Pelvis HDR-brachy 30/30 6 5/5 Ir-192        RELEVANT MEDICAL HISTORY:  Colonoscopy last completed 2017, no polyps.   Past Medical History:  Diagnosis Date   Arthritis    both knees   Breast CA (HCC) 01/2024   right breast DCIS   CKD (chronic kidney disease), stage III  (HCC)    no nephrologist   Complication of anesthesia    vertigo after TAH   Diabetes mellitus without  complication (HCC)    DM2, injection once a week; cbgs normally 110-130   Essential hypertension    Gout    History of radiation therapy    Endometrium- 12/03/21-01/02/22- Dr. Lynwood Nasuti   Mixed hyperlipidemia    Morbid obesity (HCC)    Pure hypercholesterolemia    uterine ca 09/2021   Vitamin D deficiency    Wears glasses     Past Surgical History:  Procedure Laterality Date   BREAST BIOPSY Right 08/21/2023   MM RT BREAST BX W LOC DEV 1ST LESION IMAGE BX SPEC STEREO GUIDE 08/21/2023 [facility]   BREAST BIOPSY Right 02/24/2024   MM RT BREAST BX W LOC DEV 1ST LESION IMAGE BX SPEC STEREO GUIDE 02/24/2024 [facility]   BREAST BIOPSY  03/11/2024   MM RT RADIOACTIVE SEED LOC MAMMO GUIDE 03/11/2024 [facility]   BREAST LUMPECTOMY WITH RADIOACTIVE SEED LOCALIZATION Right 03/14/2024   Procedure: BREAST LUMPECTOMY WITH RADIOACTIVE SEED LOCALIZATION;  Surgeon: Aron Shoulders, MD;  Location: [facility];  Service: General;  Laterality: Right;   CHOLECYSTECTOMY  1989   DILATATION & CURETTAGE/HYSTEROSCOPY WITH MYOSURE N/A 09/30/2021   Procedure: DILATATION & CURETTAGE/HYSTEROSCOPY WITH MYOSURE;  Surgeon: Gorge Ade, MD;  Location: [facility];  Service: Gynecology;  Laterality: N/A;   EYE SURGERY     cataract surgery, lens implant on right eye 2008 Left eye 2023   LASIK Bilateral 2002   LYMPH NODE DISSECTION N/A 10/10/2021   Procedure: LYMPH NODE DISSECTION;  Surgeon: Viktoria Comer SAUNDERS, MD;  Location: [facility];  Service: Gynecology;  Laterality: N/A;   ROBOTIC ASSISTED TOTAL HYSTERECTOMY WITH BILATERAL SALPINGO OOPHERECTOMY Bilateral 10/10/2021   Procedure: XI ROBOTIC ASSISTED TOTAL HYSTERECTOMY WITH BILATERAL SALPINGO OOPHORECTOMY;CYSTOSCOPY;  Surgeon: Viktoria Comer SAUNDERS, MD;  Location: [facility];  Service: Gynecology;  Laterality: Bilateral;    SENTINEL NODE BIOPSY N/A 10/10/2021   Procedure: SENTINEL NODE BIOPSY;  Surgeon: Viktoria Comer SAUNDERS, MD;  Location: [facility];  Service: Gynecology;  Laterality: N/A;   TONSILLECTOMY  1953    Social History   Socioeconomic History   Marital status: Single    Spouse name: Not on file   Number of children: Not on file   Years of education: Not on file   Highest education level: Not on file  Occupational History   Not on file  Tobacco Use   Smoking status: Never   Smokeless tobacco: Never  Vaping Use   Vaping status: Never Used  Substance and Sexual Activity   Alcohol use: Never   Drug use: Never   Sexual activity: Not Currently    Birth control/protection: Surgical    Comment: hyst  Other Topics Concern   Not on file  Social History Narrative   Not on file   Social Drivers of Health   Financial Resource Strain: Not on file  Food Insecurity: No Food Insecurity (04/14/2024)   Hunger Vital Sign    Worried About Running Out of Food in the Last Year: Never true    Ran Out of Food in the Last Year: Never true  Transportation Needs: No Transportation Needs (04/14/2024)   PRAPARE - Administrator, Civil Service (Medical): No    Lack of Transportation (Non-Medical): No  Physical Activity: Not on file  Stress: Not on file  Social Connections: Not on file     FAMILY HISTORY:  We obtained a detailed, 4-generation family history.  Significant diagnoses are listed below: Family History  Problem Relation Age  of Onset   Skin cancer Father        skin cancer   Breast cancer Sister        breast and skin   Skin cancer Sister    Skin cancer Sister    Lung cancer Maternal Uncle    Breast cancer Paternal Aunt 35 - 40   Uterine cancer Paternal Aunt 24 - 47   Breast cancer Maternal Cousin 66 - 59   Melanoma Maternal Cousin    Prostate cancer Paternal Cousin 43 - 86   Colon cancer Neg Hx    Ovarian cancer Neg Hx    Endometrial cancer Neg Hx    Pancreatic cancer Neg Hx      Ms. Hawbaker is unaware of previous family history of genetic testing for hereditary cancer risks. Patient's ancestors are of [place] descent. There is no reported Ashkenazi Jewish ancestry.     GENETIC COUNSELING ASSESSMENT: Ms. Adelstein is a 73 y.o. female with a personal and family history of cancer which is somewhat suggestive of a hereditary predisposition to cancer given her personal history of endometrial and breast cancer, and family history of two additional close relatives diagnosed with breast cancer. We, therefore, discussed and recommended the following at today's visit.   DISCUSSION: We discussed that 5 - 10% of cancer is hereditary, with most cases of breast cancer associated with BRCA1/2 and most cases of endometrial cancer associated with Lynch syndrome.  There are other genes that can be associated with hereditary breast and endometrial cancer syndromes.  We discussed that testing is beneficial for several reasons including knowing how to follow individuals after completing their treatment, identifying whether potential treatment options would be beneficial, and understanding if other family members could be at risk for cancer and allowing them to undergo genetic testing.   We reviewed the characteristics, features and inheritance patterns of hereditary cancer syndromes. We also discussed genetic testing, including the appropriate family members to test, the process of testing, insurance coverage and turn-around-time for results. We discussed the implications of a negative, positive, carrier and/or variant of uncertain significant result. We recommended Ms. Stiehl pursue genetic testing for a panel that includes genes associated with breast and endometrial  cancer.   Ms. Foye  was offered a common hereditary cancer panel (40 genes) and an expanded pan-cancer panel (77 genes). Ms. Arteaga was informed of the benefits and limitations of each panel, including that  expanded pan-cancer panels contain genes that do not have clear management guidelines at this point in time.  We also discussed that as the number of genes included on a panel increases, the chances of variants of uncertain significance increases. After considering the benefits and limitations of each gene panel, Ms. Rubiano elected to have [facility] CancerNext-Expanded +RNAinsight panel. The CancerNext-Expanded gene panel offered by [facility] and includes sequencing, rearrangement, and RNA analysis for the following 77 genes: AIP, ALK, APC, ATM, AXIN2, BAP1, BARD1, BMPR1A, BRCA1, BRCA2, BRIP1, CDC73, CDH1, CDK4, CDKN1B, CDKN2A, CEBPA, CHEK2, CTNNA1, DDX41, DICER1, EGFR, EPCAM, ETV6, FH, FLCN, GATA2, GREM1, HOXB13, KIT, LZTR1, MAX, MBD4, MEN1, MET, MITF, MLH1, MSH2, MSH3, MSH6, MUTYH, NF1, NF2, NTHL1, PALB2, PDGFRA, PHOX2B, PMS2, POLD1, POLE, POT1, PRKAR1A, PTCH1, PTEN, RAD51C, RAD51D, RB1, RET, RPS20, RUNX1, SDHA, SDHAF2, SDHB, SDHC, SDHD, SMAD4, SMARCA4, SMARCB1, SMARCE1, STK11, SUFU, TMEM127, TP53, TSC1, TSC2, VHL, WT1.    Based on Ms. Dennen's personal and family history of cancer, she meets medical criteria for genetic testing. Despite that she meets criteria, she may  still have an out of pocket cost. We discussed that if her out of pocket cost for testing is over $100, the laboratory should contact them to discuss self-pay prices, patient pay assistance programs, if applicable, and other billing options.  PLAN: After considering the risks, benefits, and limitations, Ms. Cochrane provided informed consent to pursue genetic testing and the blood sample was sent to [facility] for analysis of the CancerNext-Expanded +RNAinsight test. Results should be available within approximately 2-3 weeks' time, at which point they will be disclosed by telephone to Ms. Rokosz, as will any additional recommendations warranted by these results. Ms. Hangartner will receive a summary of her genetic  counseling visit and a copy of her results once available. This information will also be available in Epic.   Lastly, we encouraged Ms. Hochman to remain in contact with cancer genetics annually so that we can continuously update the family history and inform her of any changes in cancer genetics and testing that may be of benefit for this family.   Ms. Olejniczak questions were answered to her satisfaction today. Our contact information was provided should additional questions or concerns arise. Thank you for the referral and allowing us  to share in the care of your patient.   Burnard Ogren, MS, [facility] Licensed, Retail [email or web address] phone: [phone]   55 minutes were spent on the date of the encounter in service to the patient including preparation, face-to-face consultation, documentation and care coordination.  The patient brought her sister to this appointment.  Drs. Gudena and/or Lanny were available to discuss this case as needed.   _______________________________________________________________________ For Office Staff:  Number of people involved in session: 2 Was an Intern/ student involved with case: yes; [facility] Sophia was present for this appointment with patient permission

## 2024-05-24 NOTE — Progress Notes (Signed)
 Patient Care Team: [name], MD as PCP - General (Family Medicine) Glean Stephane BROCKS, RN (Inactive) as Oncology Nurse Navigator Tyree Nanetta SAILOR, RN as Oncology Nurse Navigator Odean Potts, MD as Consulting Physician (Hematology and Oncology)  DIAGNOSIS:  Encounter Diagnosis  Name Primary?   Malignant neoplasm of upper-inner quadrant of right breast in female, estrogen receptor positive (HCC) Yes    SUMMARY OF ONCOLOGIC HISTORY: Oncology History Overview Note  MSI- H MLH1 promoter hypermethylation is present  MMR IHC MLH1:          LOSS OF NUCLEAR EXPRESSION  MSH2:          Preserved nuclear expression  MSH6:          Preserved nuclear expression  PMS2:          LOSS OF NUCLEAR EXPRESSION   Endometrial cancer (HCC)  09/30/2021 Initial Biopsy   Hysteroscopy D&C, and MyoSure resection of anterior wall endometrial polyp (noted to be incomplete).  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   10/04/2021 Initial Diagnosis   Endometrial cancer (HCC)   10/09/2021 Imaging   CT A/P: IMPRESSION: Endometrial thickening measuring 10 mm, consistent with known history of endometrial carcinoma.   2 cm subserosal fibroid in anterior uterine fundus.   No evidence of metastatic disease within the abdomen or pelvis.   Colonic diverticulosis, without radiographic evidence of diverticulitis.   Mild hepatic steatosis.   Aortic Atherosclerosis   10/10/2021 Surgery   TRH/BSO, SLN biopsy, cysto, repair vaginal laceration  Findings: On EUA, small mobile uterus. On intra-abdminal entry, some adhesions of omentum to the anterior abdominal wall in the mid right abdomen, filmy.  Normal liver, diaphragm, stomach.  Normal small and large bowel.  Uterus approximately 8 cm in size with a 1-2 cm fundal left fibroid.  Ovaries unremarkable although larger than would expect in a postmenopausal patient.  Normal-appearing fallopian tubes although  disrupted.  Clip within the pelvis noted on bladder peritoneum.  Mapping successful bilaterally to external iliac sentinel lymph nodes.  No obvious evidence of extrauterine disease.  Right ovary adherent to the posterior uterus and broad ligament, rectum somewhat adherent posteriorly within the cul-de-sac.  Area of questionable deserosalization of the rectum was oversewn, negative bubble test.  Given significant adhesions of the bladder to the cervix, cystoscopy was performed, bladder and dome intact, good efflux noted from bilateral ureteral orifices.   10/10/2021 Pathology Results   A. SENTINEL LYMPH NODE, RIGHT EXTERNAL ILIAC, BIOPSY:  -  No carcinoma identified in one lymph node (0/1)  -  See comment   B. SENTINEL LYMPH NODE, LEFT EXTERNAL ILIAC, BIOPSY:  -  No carcinoma identified in one lymph node (0/1)  -  See comment   C. UTERUS, CERVIX, BILATERAL TUBES AND OVARIES:  Uterus:  -  Endometrioid carcinoma, FIGO grade 2, largely involving an  endometrial polyp  -  Benign endometrial polyp  -  Leiomyomata (2.2 cm; largest)  -  Adenomyosis  -  See oncology table and comment below   Cervix:  -  No carcinoma identified   Bilateral Ovaries:  -  No carcinoma identified   Bilateral Fallopian tubes:  -  No carcinoma identified   ONCOLOGY TABLE:   UTERUS, CARCINOMA OR CARCINOSARCOMA: Resection   Procedure: Total hysterectomy and bilateral salpingo-oophorectomy  Histologic Type: Endometrioid carcinoma, NOS  Histologic Grade: FIGO grade 2  Myometrial Invasion:       Depth of Myometrial Invasion (mm): 2  Myometrial Thickness (mm): 22       Percentage of Myometrial Invasion: Less than 50%  Uterine Serosa Involvement: Not identified  Cervical stromal Involvement: Not identified  Extent of involvement of other tissue/organs: Not identified  Peritoneal/Ascitic Fluid: Not submitted unknown  Lymphovascular Invasion: Not identified  Regional Lymph Nodes:       Pelvic Lymph Nodes  Examined:  2 Sentinel                                0 non-sentinel                                   2 total       Pelvic Lymph Nodes with Metastasis: 0                           Macrometastasis: (>2.0 mm): 0                          Micrometastasis: (>0.2 mm and < 2.0 mm): 0                            Isolated Tumor Cells (<0.2 mm): 0                       Laterality of Lymph Node with Tumor: N/A                             Extracapsular Extension: N/A       Para-aortic Lymph Nodes Examined: N/A  Distant Metastasis:       Distant Site(s) Involved: N/A  Pathologic Stage Classification (pTNM, AJCC 8th Edition): pT1a, pN0  Ancillary Studies: MMR / MSI testing will be ordered  Representative Tumor Block: C6  Comment(s): See above   12/03/2021 - 01/02/2022 Radiation Therapy   12/03/2021 through 01/02/2022 Site Technique Total Dose (Gy) Dose per Fx (Gy) Completed Fx Beam Energies  Vagina: Pelvis HDR-brachy 30/30 6 5/5 [id number]        CHIEF COMPLIANT: Follow-up after radiation is complete  HISTORY OF PRESENT ILLNESS:  History of Present Illness Becky Dudley is a 73 year old female with estrogen receptor positive breast cancer who presents for discussion of hormone therapy.  She is currently undergoing radiation therapy with three sessions remaining. Joint stiffness is present in her knees and hips, especially after prolonged sitting. She does not experience hot flashes. Previous bone density tests were normal. She takes vitamin D 5000 mg daily but is inconsistent with calcium supplementation.     ALLERGIES:  is allergic to lisinopril.  MEDICATIONS:  Current Outpatient Medications  Medication Sig Dispense Refill   acetaminophen  (TYLENOL ) 325 MG tablet Take 650 mg by mouth every 6 (six) hours as needed for mild pain.     amLODipine (NORVASC) 5 MG tablet TAKE 1 TABLET BY MOUTH ONCE  DAILY for 90     aspirin 81 MG chewable tablet Chew 81 mg by mouth daily.     BIOTIN MAXIMUM PO  Take by mouth.     calcium carbonate (OSCAL) 1500 (600 Ca) MG TABS tablet Take 1,200 mg by mouth daily.     Cholecalciferol (VITAMIN D3) 125 MCG (  5000 UT) TABS Take 5,000 Units by mouth daily.     diclofenac Sodium (VOLTAREN) 1 % GEL Apply 2 g topically daily as needed (Arthritis). For knees     loratadine (CLARITIN) 10 MG tablet Take 10 mg by mouth daily as needed for allergies or rhinitis.     Menthol, Topical Analgesic, (BIOFREEZE ROLL-ON EX) Apply 1 application topically daily as needed (Back pain).     naphazoline-pheniramine (VISINE) 0.025-0.3 % ophthalmic solution 1 drop daily as needed (Dry eye).     rosuvastatin (CRESTOR) 5 MG tablet Take 5 mg by mouth at bedtime.     Semaglutide, 1 MG/DOSE, (OZEMPIC, 1 MG/DOSE,) 2 MG/1.5ML SOPN Inject 1 mg into the skin once a week. Injection on Sundays.     sodium chloride  (OCEAN) 0.65 % SOLN nasal spray Place 1 spray into both nostrils as needed for congestion.     telmisartan (MICARDIS) 40 MG tablet Take 40 mg by mouth daily.     UNABLE TO FIND Apply 1 application topically daily as needed (Leg cramps). [place] OTC for leg cramps     oxyCODONE  (OXY IR/ROXICODONE ) 5 MG immediate release tablet Take 0.5-1 tablets (2.5-5 mg total) by mouth every 6 (six) hours as needed for severe pain (pain score 7-10). (Patient not taking: Reported on 05/24/2024) 5 tablet 0   zinc gluconate 50 MG tablet Take 25 mg by mouth daily. (Patient not taking: Reported on 05/24/2024)     No current facility-administered medications for this visit.    PHYSICAL EXAMINATION: ECOG PERFORMANCE STATUS: 1 - Symptomatic but completely ambulatory  Vitals:   05/24/24 1540  BP: (!) 143/57  Pulse: 70  Resp: 16  Temp: 98.3 F (36.8 C)  SpO2: 97%   Filed Weights   05/24/24 1540  Weight: 217 lb 9.6 oz (98.7 kg)    Physical Exam   (exam performed in the presence of a chaperone)  LABORATORY DATA:  I have reviewed the data as listed    Latest Ref Rng & Units 03/08/2024    9:51  AM 10/16/2021   10:17 AM 10/10/2021   12:00 PM  CMP  Glucose 70 - 99 mg/dL 880  899  887   BUN 8 - 23 mg/dL 15  16  11    Creatinine 0.44 - 1.00 mg/dL 8.90  8.79  8.87   Sodium 135 - 145 mmol/L 143  144  138   Potassium 3.5 - 5.1 mmol/L 4.1  4.1  3.2   Chloride 98 - 111 mmol/L 110  110  103   CO2 22 - 32 mmol/L 25  29  28    Calcium 8.9 - 10.3 mg/dL 8.9  9.4  9.2     Lab Results  Component Value Date   WBC 7.6 10/09/2021   HGB 13.3 10/09/2021   HCT 40.0 10/09/2021   MCV 91.3 10/09/2021   PLT 275 10/09/2021    ASSESSMENT & PLAN:  Malignant neoplasm of upper-inner quadrant of right breast in female, estrogen receptor positive (HCC) 03/14/2024: Right lumpectomy: Grade 2 ILC 0.7 cm, multifocal with grade 2 DCIS, posterior margin positive for invasive cancer (pectoralis muscle) and DCIS, LVI not identified, ER 90%, PR 100%, HER2 0 negative, Ki-67 5%  04/29/2024-05/27/2024: Adjuvant radiation  Treatment plan: Antiestrogen therapy with letrozole 1 mg daily x 5-7 years Anastrozole  counseling: We discussed the risks and benefits of anti-estrogen therapy with aromatase inhibitors. These include but not limited to insomnia, hot flashes, mood changes, vaginal dryness, bone density loss, and weight gain.  We strongly believe that the benefits far outweigh the risks. Patient understands these risks and consented to starting treatment. Planned treatment duration is 5-7 years.  Return to clinic in 3 months for survivorship care plan visit   Assessment & Plan Estrogen receptor positive malignant neoplasm of upper-inner quadrant of right breast Undergoing radiation therapy with three sessions remaining. Cancer is estrogen receptor positive, necessitating antiestrogen therapy. Anastrozole  recommended to reduce estrogen production. Potential side effects include hot flashes and joint stiffness. Osteoporosis is a concern, and bone density monitoring is advised. Previous bone density tests normal. Inconsistent  calcium supplementation. - Prescribe anastrozole  1 mg daily for 5 to 7 years. - Monitor for joint stiffness and hot flashes. - Continue bone density tests every two years. - Encourage calcium intake through diet. - Continue vitamin D supplementation. - Recommend weight-bearing exercises such as walking or using an elliptical. - Send prescription to [facility] for 90-day supply. - Follow up in three months to reassess medication tolerance and discuss long-term management.      No orders of the defined types were placed in this encounter.  The patient has a good understanding of the overall plan. she agrees with it. she will call with any problems that may develop before the next visit here. Total time spent: 30 mins including face to face time and time spent for planning, charting and co-ordination of care   Viinay K Lamark Schue, MD 05/24/24

## 2024-05-25 ENCOUNTER — Other Ambulatory Visit: Payer: Self-pay

## 2024-05-25 ENCOUNTER — Ambulatory Visit
Admission: RE | Admit: 2024-05-25 | Discharge: 2024-05-25 | Disposition: A | Discharge status: Home / Self Care | Source: Ambulatory Visit | Attending: Radiation Oncology | Admitting: Radiation Oncology

## 2024-05-25 DIAGNOSIS — Z510 Encounter for antineoplastic radiation therapy: Secondary | ICD-10-CM | POA: Diagnosis not present

## 2024-05-25 LAB — RAD ONC ARIA SESSION SUMMARY
Course Elapsed Days: 27
Plan Fractions Treated to Date: 4
Plan Prescribed Dose Per Fraction: 2.3 Gy
Plan Total Fractions Prescribed: 6
Plan Total Prescribed Dose: 13.8 Gy
Reference Point Dosage Given to Date: 9.2 Gy
Reference Point Session Dosage Given: 2.3 Gy
Session Number: 19

## 2024-05-26 ENCOUNTER — Other Ambulatory Visit: Payer: Self-pay

## 2024-05-26 ENCOUNTER — Ambulatory Visit
Admission: RE | Admit: 2024-05-26 | Discharge: 2024-05-26 | Disposition: A | Discharge status: Home / Self Care | Source: Ambulatory Visit | Attending: Radiation Oncology | Admitting: Radiation Oncology

## 2024-05-26 DIAGNOSIS — Z510 Encounter for antineoplastic radiation therapy: Secondary | ICD-10-CM | POA: Diagnosis not present

## 2024-05-26 LAB — RAD ONC ARIA SESSION SUMMARY
Course Elapsed Days: 28
Plan Fractions Treated to Date: 5
Plan Prescribed Dose Per Fraction: 2.3 Gy
Plan Total Fractions Prescribed: 6
Plan Total Prescribed Dose: 13.8 Gy
Reference Point Dosage Given to Date: 11.5 Gy
Reference Point Session Dosage Given: 2.3 Gy
Session Number: 20

## 2024-05-27 ENCOUNTER — Ambulatory Visit
Admission: RE | Admit: 2024-05-27 | Discharge: 2024-05-27 | Disposition: A | Discharge status: Home / Self Care | Source: Ambulatory Visit | Attending: Radiation Oncology | Admitting: Radiation Oncology

## 2024-05-27 ENCOUNTER — Other Ambulatory Visit: Payer: Self-pay

## 2024-05-27 DIAGNOSIS — Z510 Encounter for antineoplastic radiation therapy: Secondary | ICD-10-CM | POA: Diagnosis not present

## 2024-05-27 LAB — RAD ONC ARIA SESSION SUMMARY
Course Elapsed Days: 29
Plan Fractions Treated to Date: 6
Plan Prescribed Dose Per Fraction: 2.3 Gy
Plan Total Fractions Prescribed: 6
Plan Total Prescribed Dose: 13.8 Gy
Reference Point Dosage Given to Date: 13.8 Gy
Reference Point Session Dosage Given: 2.3 Gy
Session Number: 21

## 2024-05-30 NOTE — Radiation Completion Notes (Addendum)
  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 Intent: Curative     ==========DELIVERED PLANS==========  First Treatment Date: 2024-04-28 Last Treatment Date: 2024-05-27   Plan Name: Breast_R Site: Breast, Right Technique: 3D Mode: Photon Dose Per Fraction: 2.67 Gy Prescribed Dose (Delivered / Prescribed): 40.05 Gy / 40.05 Gy Prescribed Fxs (Delivered / Prescribed): 15 / 15   Plan Name: Breast_R_Bst Site: Breast, Right Technique: 3D Mode: Photon Dose Per Fraction: 2.3 Gy Prescribed Dose (Delivered / Prescribed): 13.8 Gy / 13.8 Gy Prescribed Fxs (Delivered / Prescribed): 6 / 6     ====================================   The patient tolerated radiation. She developed fatigue and anticipated skin changes in the treatment field. She reported tenderness to the breast.   The patient will return in one month and will continue follow up with Dr. Gudena as well.      Ronita Due, PA-C

## 2024-06-14 ENCOUNTER — Telehealth: Payer: Self-pay | Admitting: Genetic Counselor

## 2024-06-14 NOTE — Telephone Encounter (Signed)
 LVM asking for call back to review results of genetic testing.

## 2024-06-21 ENCOUNTER — Ambulatory Visit: Payer: Self-pay | Admitting: Genetic Counselor

## 2024-06-21 DIAGNOSIS — Z1379 Encounter for other screening for genetic and chromosomal anomalies: Secondary | ICD-10-CM

## 2024-06-21 NOTE — Telephone Encounter (Signed)
 I spoke to Becky Dudley to review results of genetic testing. she had genetic testing with [facility]Exp[facility] +RNA[facility]ight panel. Testing did not identify any variants known to increase the risk for cancer.  Discussed that we do not know why she has cancer or why there is cancer in the family. It could be that the cancers in her family occurred by chance, due to a different gene that we are not testing, or maybe our current technology may not be able to pick something up.  It will be important for her to keep in contact with genetics to keep up with whether additional testing may be needed.  Please see counseling note for further detail on this result.

## 2024-06-22 ENCOUNTER — Encounter: Payer: Self-pay | Admitting: Gynecologic Oncology

## 2024-06-22 DIAGNOSIS — Z1379 Encounter for other screening for genetic and chromosomal anomalies: Secondary | ICD-10-CM | POA: Insufficient documentation

## 2024-06-22 NOTE — Progress Notes (Signed)
 HPI:  Becky Dudley was previously seen in the [facility] due to a personal and family history of cancer and concerns regarding a hereditary predisposition to cancer. Please refer to our prior cancer genetics clinic note for more information regarding our discussion, assessment and recommendations, at the time. Becky Dudley recent genetic test results were disclosed to her, as were recommendations warranted by these results. These results and recommendations are discussed in more detail below.  CANCER HISTORY:  Oncology History Overview Note  MSI- H MLH1 promoter hypermethylation is present  MMR IHC MLH1:          LOSS OF NUCLEAR EXPRESSION  MSH2:          Preserved nuclear expression  MSH6:          Preserved nuclear expression  PMS2:          LOSS OF NUCLEAR EXPRESSION   Endometrial cancer (HCC)  09/30/2021 Initial Biopsy   Hysteroscopy D&C, and MyoSure resection of anterior wall endometrial polyp (noted to be incomplete).  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   10/04/2021 Initial Diagnosis   Endometrial cancer (HCC)   10/09/2021 Imaging   CT A/P: IMPRESSION: Endometrial thickening measuring 10 mm, consistent with known history of endometrial carcinoma.   2 cm subserosal fibroid in anterior uterine fundus.   No evidence of metastatic disease within the abdomen or pelvis.   Colonic diverticulosis, without radiographic evidence of diverticulitis.   Mild hepatic steatosis.   Aortic Atherosclerosis   10/10/2021 Surgery   TRH/BSO, SLN biopsy, cysto, repair vaginal laceration  Findings: On EUA, small mobile uterus. On intra-abdminal entry, some adhesions of omentum to the anterior abdominal wall in the mid right abdomen, filmy.  Normal liver, diaphragm, stomach.  Normal small and large bowel.  Uterus approximately 8 cm in size with a 1-2 cm fundal left fibroid.  Ovaries unremarkable although larger than would  expect in a postmenopausal patient.  Normal-appearing fallopian tubes although disrupted.  Clip within the pelvis noted on bladder peritoneum.  Mapping successful bilaterally to external iliac sentinel lymph nodes.  No obvious evidence of extrauterine disease.  Right ovary adherent to the posterior uterus and broad ligament, rectum somewhat adherent posteriorly within the cul-de-sac.  Area of questionable deserosalization of the rectum was oversewn, negative bubble test.  Given significant adhesions of the bladder to the cervix, cystoscopy was performed, bladder and dome intact, good efflux noted from bilateral ureteral orifices.   10/10/2021 Pathology Results   A. SENTINEL LYMPH NODE, RIGHT EXTERNAL ILIAC, BIOPSY:  -  No carcinoma identified in one lymph node (0/1)  -  See comment   B. SENTINEL LYMPH NODE, LEFT EXTERNAL ILIAC, BIOPSY:  -  No carcinoma identified in one lymph node (0/1)  -  See comment   C. UTERUS, CERVIX, BILATERAL TUBES AND OVARIES:  Uterus:  -  Endometrioid carcinoma, FIGO grade 2, largely involving an  endometrial polyp  -  Benign endometrial polyp  -  Leiomyomata (2.2 cm; largest)  -  Adenomyosis  -  See oncology table and comment below   Cervix:  -  No carcinoma identified   Bilateral Ovaries:  -  No carcinoma identified   Bilateral Fallopian tubes:  -  No carcinoma identified   ONCOLOGY TABLE:   UTERUS, CARCINOMA OR CARCINOSARCOMA: Resection   Procedure: Total hysterectomy and bilateral salpingo-oophorectomy  Histologic Type: Endometrioid carcinoma, NOS  Histologic Grade: FIGO grade 2  Myometrial Invasion:  Depth of Myometrial Invasion (mm): 2       Myometrial Thickness (mm): 22       Percentage of Myometrial Invasion: Less than 50%  Uterine Serosa Involvement: Not identified  Cervical stromal Involvement: Not identified  Extent of involvement of other tissue/organs: Not identified  Peritoneal/Ascitic Fluid: Not submitted unknown   Lymphovascular Invasion: Not identified  Regional Lymph Nodes:       Pelvic Lymph Nodes Examined:  2 Sentinel                                0 non-sentinel                                   2 total       Pelvic Lymph Nodes with Metastasis: 0                           Macrometastasis: (>2.0 mm): 0                          Micrometastasis: (>0.2 mm and < 2.0 mm): 0                            Isolated Tumor Cells (<0.2 mm): 0                       Laterality of Lymph Node with Tumor: N/A                             Extracapsular Extension: N/A       Para-aortic Lymph Nodes Examined: N/A  Distant Metastasis:       Distant Site(s) Involved: N/A  Pathologic Stage Classification (pTNM, AJCC 8th Edition): pT1a, pN0  Ancillary Studies: MMR / MSI testing will be ordered  Representative Tumor Block: C6  Comment(s): See above   12/03/2021 - 01/02/2022 Radiation Therapy   12/03/2021 through 01/02/2022 Site Technique Total Dose (Gy) Dose per Fx (Gy) Completed Fx Beam Energies  Vagina: Pelvis HDR-brachy 30/30 6 5/5 Ir-192       Genetic Testing   Negative CancerNext-Expanded +RNAinsight. The CancerNext-Expanded gene panel offered by [facility] and includes sequencing, rearrangement, and RNA analysis for the following 77 genes: AIP, ALK, APC, ATM, AXIN2, BAP1, BARD1, BMPR1A, BRCA1, BRCA2, BRIP1, CDC73, CDH1, CDK4, CDKN1B, CDKN2A, CEBPA, CHEK2, CTNNA1, DDX41, DICER1, EGFR, EPCAM, ETV6, FH, FLCN, GATA2, GREM1, HOXB13, KIT, LZTR1, MAX, MBD4, MEN1, MET, MITF, MLH1, MSH2, MSH3, MSH6, MUTYH, NF1, NF2, NTHL1, PALB2, PDGFRA, PHOX2B, PMS2, POLD1, POLE, POT1, PRKAR1A, PTCH1, PTEN, RAD51C, RAD51D, RB1, RET, RPS20, RUNX1, SDHA, SDHAF2, SDHB, SDHC, SDHD, SMAD4, SMARCA4, SMARCB1, SMARCE1, STK11, SUFU, TMEM127, TP53, TSC1, TSC2, VHL, WT1.  Report date 06/10/24.      FAMILY HISTORY:  We obtained a detailed, 4-generation family history.  Significant diagnoses are listed below: Family History  Problem Relation Age  of Onset   Skin cancer Father        skin cancer   Breast cancer Sister        breast and skin   Skin cancer Sister    Skin cancer Sister    Lung cancer Maternal Uncle    Breast cancer Paternal Aunt  60 - 69   Uterine cancer Paternal Aunt 80 - 89   Breast cancer Maternal Cousin 50 - 59   Melanoma Maternal Cousin    Prostate cancer Paternal Cousin 73 - 74   Colon cancer Neg Hx    Ovarian cancer Neg Hx    Endometrial cancer Neg Hx    Pancreatic cancer Neg Hx     Becky Dudley is unaware of previous family history of genetic testing for hereditary cancer risks. Patient's ancestors are of [place] descent. There is no reported Ashkenazi Jewish ancestry.      GENETIC TEST RESULTS: Genetic testing reported out on 06/10/24 through the [facility] CancerNext-Expanded +RNAinsight panel found no pathogenic mutations. The CancerNext-Expanded gene panel offered by [facility] and includes sequencing, rearrangement, and RNA analysis for the following 77 genes: AIP, ALK, APC, ATM, AXIN2, BAP1, BARD1, BMPR1A, BRCA1, BRCA2, BRIP1, CDC73, CDH1, CDK4, CDKN1B, CDKN2A, CEBPA, CHEK2, CTNNA1, DDX41, DICER1, EGFR, EPCAM, ETV6, FH, FLCN, GATA2, GREM1, HOXB13, KIT, LZTR1, MAX, MBD4, MEN1, MET, MITF, MLH1, MSH2, MSH3, MSH6, MUTYH, NF1, NF2, NTHL1, PALB2, PDGFRA, PHOX2B, PMS2, POLD1, POLE, POT1, PRKAR1A, PTCH1, PTEN, RAD51C, RAD51D, RB1, RET, RPS20, RUNX1, SDHA, SDHAF2, SDHB, SDHC, SDHD, SMAD4, SMARCA4, SMARCB1, SMARCE1, STK11, SUFU, TMEM127, TP53, TSC1, TSC2, VHL, WT1. The test report has been scanned into EPIC and is located under the Molecular Pathology section of the Results Review tab.  A portion of the result report is included below for reference.     We discussed with Becky Dudley that because current genetic testing is not perfect, it is possible there may be a gene mutation in one of these genes that current testing cannot detect, but that chance is small.  We also discussed, that there could be another  gene that has not yet been discovered, or that we have not yet tested, that is responsible for the cancer diagnoses in the family. It is also possible there is a hereditary cause for the cancer in the family that Becky Dudley did not inherit and therefore was not identified in her testing.  Therefore, it is important to remain in touch with cancer genetics in the future so that we can continue to offer Becky Dudley the most up to date genetic testing.     ADDITIONAL GENETIC TESTING: We discussed with Becky Dudley that her genetic testing was fairly extensive.  If there are genes identified to increase cancer risk that can be analyzed in the future, we would be happy to discuss and coordinate this testing at that time.    CANCER SCREENING RECOMMENDATIONS: Becky Dudley test result is considered negative (normal).  This means that we have not identified a hereditary cause for her personal and family history of cancer at this time. Most cancers happen by chance and this negative test suggests that her personal and family history of cancer may fall into this category.    Possible reasons for Becky Dudley's negative genetic test include:  1. There may be a gene mutation in one of these genes that current testing methods cannot detect but that chance is small.  2. There could be another gene that has not yet been discovered, or that we have not yet tested, that is responsible for the cancer diagnoses in the family.  3.  There may be no hereditary risk for cancer in the family. The cancers in Becky Dudley and/or her family may be sporadic/familial or due to other genetic and environmental factors. 4. It is also possible there is a hereditary  cause for the cancer in the family that Becky Dudley did not inherit.  Therefore, it is recommended she continue to follow the cancer management and screening guidelines provided by her oncology and primary healthcare provider. An individual's cancer risk and  medical management are not determined by genetic test results alone. Overall cancer risk assessment incorporates additional factors, including personal medical history, family history, and any available genetic information that may result in a personalized plan for cancer prevention and surveillance  Given Becky Dudley's personal and family histories, we must interpret these negative results with some caution.  Families with features suggestive of hereditary risk for cancer tend to have multiple family members with cancer, diagnoses in multiple generations and diagnoses before the age of 22. Becky Dudley family exhibits some of these features. Thus, this result may simply reflect our current inability to detect all mutations within these genes or there may be a different gene that has not yet been discovered or tested.   RECOMMENDATIONS FOR FAMILY MEMBERS:  Individuals in this family might be at some increased risk of developing cancer, over the general population risk, simply due to the family history of cancer.  We recommended women in this family have a yearly mammogram beginning at age 21, or 25 years younger than the earliest onset of cancer, an annual clinical breast exam, and perform monthly breast self-exams. Women in this family should also have a gynecological exam as recommended by their primary provider. All family members should be referred for colonoscopy starting at age 78, or 48 years younger than the earliest onset of cancer.  FOLLOW-UP: Lastly, we discussed with Becky Dudley that cancer genetics is a rapidly advancing field and it is possible that new genetic tests will be appropriate for her and/or her family members in the future. We encouraged her to remain in contact with cancer genetics on an annual basis so we can update her personal and family histories and let her know of advances in cancer genetics that may benefit this family.   Our contact number was provided. Ms.  Dudley questions were answered to her satisfaction, and she knows she is welcome to call us  at anytime with additional questions or concerns.   Burnard Ogren, MS, [facility] Licensed, [email or web address] [phone]

## 2024-06-24 ENCOUNTER — Inpatient Hospital Stay
Payer: No Typology Code available for payment source | Attending: Hematology and Oncology | Admitting: Gynecologic Oncology

## 2024-06-24 ENCOUNTER — Encounter: Payer: Self-pay | Admitting: Gynecologic Oncology

## 2024-06-24 VITALS — BP 133/72 | HR 86 | Temp 97.7°F | Resp 20 | Ht 64.0 in | Wt 216.6 lb

## 2024-06-24 DIAGNOSIS — Z170 Estrogen receptor positive status [ER+]: Secondary | ICD-10-CM | POA: Insufficient documentation

## 2024-06-24 DIAGNOSIS — C541 Malignant neoplasm of endometrium: Secondary | ICD-10-CM

## 2024-06-24 DIAGNOSIS — Z923 Personal history of irradiation: Secondary | ICD-10-CM | POA: Diagnosis not present

## 2024-06-24 DIAGNOSIS — Z90710 Acquired absence of both cervix and uterus: Secondary | ICD-10-CM | POA: Diagnosis not present

## 2024-06-24 DIAGNOSIS — C50919 Malignant neoplasm of unspecified site of unspecified female breast: Secondary | ICD-10-CM | POA: Insufficient documentation

## 2024-06-24 DIAGNOSIS — Z9079 Acquired absence of other genital organ(s): Secondary | ICD-10-CM | POA: Diagnosis not present

## 2024-06-24 DIAGNOSIS — Z90722 Acquired absence of ovaries, bilateral: Secondary | ICD-10-CM | POA: Diagnosis not present

## 2024-06-24 DIAGNOSIS — Z8542 Personal history of malignant neoplasm of other parts of uterus: Secondary | ICD-10-CM | POA: Insufficient documentation

## 2024-06-24 NOTE — Progress Notes (Signed)
 Gynecologic Oncology Return Clinic Visit  06/24/24  Reason for Visit: surveillance  Treatment History: Oncology History Overview Note  MSI- H MLH1 promoter hypermethylation is present  MMR IHC MLH1:          LOSS OF NUCLEAR EXPRESSION  MSH2:          Preserved nuclear expression  MSH6:          Preserved nuclear expression  PMS2:          LOSS OF NUCLEAR EXPRESSION   Endometrial cancer (HCC)  09/30/2021 Initial Biopsy   Hysteroscopy D&C, and MyoSure resection of anterior wall endometrial polyp (noted to be incomplete).  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   10/04/2021 Initial Diagnosis   Endometrial cancer (HCC)   10/09/2021 Imaging   CT A/P: IMPRESSION: Endometrial thickening measuring 10 mm, consistent with known history of endometrial carcinoma.   2 cm subserosal fibroid in anterior uterine fundus.   No evidence of metastatic disease within the abdomen or pelvis.   Colonic diverticulosis, without radiographic evidence of diverticulitis.   Mild hepatic steatosis.   Aortic Atherosclerosis   10/10/2021 Surgery   TRH/BSO, SLN biopsy, cysto, repair vaginal laceration  Findings: On EUA, small mobile uterus. On intra-abdminal entry, some adhesions of omentum to the anterior abdominal wall in the mid right abdomen, filmy.  Normal liver, diaphragm, stomach.  Normal small and large bowel.  Uterus approximately 8 cm in size with a 1-2 cm fundal left fibroid.  Ovaries unremarkable although larger than would expect in a postmenopausal patient.  Normal-appearing fallopian tubes although disrupted.  Clip within the pelvis noted on bladder peritoneum.  Mapping successful bilaterally to external iliac sentinel lymph nodes.  No obvious evidence of extrauterine disease.  Right ovary adherent to the posterior uterus and broad ligament, rectum somewhat adherent posteriorly within the cul-de-sac.  Area of questionable deserosalization of the rectum  was oversewn, negative bubble test.  Given significant adhesions of the bladder to the cervix, cystoscopy was performed, bladder and dome intact, good efflux noted from bilateral ureteral orifices.   10/10/2021 Pathology Results   A. SENTINEL LYMPH NODE, RIGHT EXTERNAL ILIAC, BIOPSY:  -  No carcinoma identified in one lymph node (0/1)  -  See comment   B. SENTINEL LYMPH NODE, LEFT EXTERNAL ILIAC, BIOPSY:  -  No carcinoma identified in one lymph node (0/1)  -  See comment   C. UTERUS, CERVIX, BILATERAL TUBES AND OVARIES:  Uterus:  -  Endometrioid carcinoma, FIGO grade 2, largely involving an  endometrial polyp  -  Benign endometrial polyp  -  Leiomyomata (2.2 cm; largest)  -  Adenomyosis  -  See oncology table and comment below   Cervix:  -  No carcinoma identified   Bilateral Ovaries:  -  No carcinoma identified   Bilateral Fallopian tubes:  -  No carcinoma identified   ONCOLOGY TABLE:   UTERUS, CARCINOMA OR CARCINOSARCOMA: Resection   Procedure: Total hysterectomy and bilateral salpingo-oophorectomy  Histologic Type: Endometrioid carcinoma, NOS  Histologic Grade: FIGO grade 2  Myometrial Invasion:       Depth of Myometrial Invasion (mm): 2       Myometrial Thickness (mm): 22       Percentage of Myometrial Invasion: Less than 50%  Uterine Serosa Involvement: Not identified  Cervical stromal Involvement: Not identified  Extent of involvement of other tissue/organs: Not identified  Peritoneal/Ascitic Fluid: Not submitted unknown  Lymphovascular Invasion: Not identified  Regional Lymph Nodes:  Pelvic Lymph Nodes Examined:  2 Sentinel                                0 non-sentinel                                   2 total       Pelvic Lymph Nodes with Metastasis: 0                           Macrometastasis: (>2.0 mm): 0                          Micrometastasis: (>0.2 mm and < 2.0 mm): 0                            Isolated Tumor Cells (<0.2 mm): 0                        Laterality of Lymph Node with Tumor: N/A                             Extracapsular Extension: N/A       Para-aortic Lymph Nodes Examined: N/A  Distant Metastasis:       Distant Site(s) Involved: N/A  Pathologic Stage Classification (pTNM, AJCC 8th Edition): pT1a, pN0  Ancillary Studies: MMR / MSI testing will be ordered  Representative Tumor Block: C6  Comment(s): See above   12/03/2021 - 01/02/2022 Radiation Therapy   12/03/2021 through 01/02/2022 Site Technique Total Dose (Gy) Dose per Fx (Gy) Completed Fx Beam Energies  Vagina: Pelvis HDR-brachy 30/30 6 5/5 Ir-192       Genetic Testing   Negative CancerNext-Expanded +RNAinsight. The CancerNext-Expanded gene panel offered by [facility] and includes sequencing, rearrangement, and RNA analysis for the following 77 genes: AIP, ALK, APC, ATM, AXIN2, BAP1, BARD1, BMPR1A, BRCA1, BRCA2, BRIP1, CDC73, CDH1, CDK4, CDKN1B, CDKN2A, CEBPA, CHEK2, CTNNA1, DDX41, DICER1, EGFR, EPCAM, ETV6, FH, FLCN, GATA2, GREM1, HOXB13, KIT, LZTR1, MAX, MBD4, MEN1, MET, MITF, MLH1, MSH2, MSH3, MSH6, MUTYH, NF1, NF2, NTHL1, PALB2, PDGFRA, PHOX2B, PMS2, POLD1, POLE, POT1, PRKAR1A, PTCH1, PTEN, RAD51C, RAD51D, RB1, RET, RPS20, RUNX1, SDHA, SDHAF2, SDHB, SDHC, SDHD, SMAD4, SMARCA4, SMARCB1, SMARCE1, STK11, SUFU, TMEM127, TP53, TSC1, TSC2, VHL, WT1.  Report date 06/10/24.      Interval History: Since her last visit with me, she was diagnosed with stage I ER/PR positive breast cancer, now status post lumpectomy and adjuvant radiation.  She is also on antiestrogen therapy.  Today, patient reports overall doing well.  She denies any vaginal bleeding.  Trying to remember to use her vaginal dilator regularly.  Denies any abdominal or pelvic pain.  Reports baseline bowel and bladder function.  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 Lynwood Nasuti   Mixed hyperlipidemia    Morbid obesity (HCC)    Pure hypercholesterolemia    uterine ca 09/2021   Vitamin D deficiency    Wears glasses     Past Surgical History:  Procedure Laterality Date   BREAST BIOPSY Right 08/21/2023   MM RT BREAST BX W LOC DEV 1ST LESION IMAGE BX SPEC STEREO GUIDE 08/21/2023 [facility]   BREAST BIOPSY Right 02/24/2024   MM RT BREAST BX W LOC DEV 1ST LESION IMAGE BX SPEC STEREO GUIDE 02/24/2024 [facility]   BREAST BIOPSY  03/11/2024   MM RT RADIOACTIVE SEED LOC MAMMO GUIDE 03/11/2024 [facility]   BREAST LUMPECTOMY WITH RADIOACTIVE SEED LOCALIZATION Right 03/14/2024   Procedure: BREAST LUMPECTOMY WITH RADIOACTIVE SEED LOCALIZATION;  Surgeon: Aron Shoulders, MD;  Location: [facility];  Service: General;  Laterality: Right;   CHOLECYSTECTOMY  1989   DILATATION & CURETTAGE/HYSTEROSCOPY WITH MYOSURE N/A 09/30/2021   Procedure: DILATATION & CURETTAGE/HYSTEROSCOPY WITH MYOSURE;  Surgeon: Gorge Ade, MD;  Location: [facility];  Service: Gynecology;  Laterality: N/A;   EYE SURGERY     cataract surgery, lens implant on right eye 2008 Left eye 2023   LASIK Bilateral 2002   LYMPH NODE DISSECTION N/A 10/10/2021   Procedure: LYMPH NODE DISSECTION;  Surgeon: Viktoria Comer SAUNDERS, MD;  Location: [facility];  Service: Gynecology;  Laterality: N/A;   ROBOTIC ASSISTED TOTAL HYSTERECTOMY WITH BILATERAL SALPINGO OOPHERECTOMY Bilateral 10/10/2021   Procedure: XI ROBOTIC ASSISTED TOTAL HYSTERECTOMY WITH BILATERAL SALPINGO OOPHORECTOMY;CYSTOSCOPY;  Surgeon: Viktoria Comer SAUNDERS, MD;  Location: [facility];  Service: Gynecology;  Laterality:  Bilateral;   SENTINEL NODE BIOPSY N/A 10/10/2021   Procedure: SENTINEL NODE BIOPSY;  Surgeon: Viktoria Comer SAUNDERS, MD;  Location: [facility];  Service: Gynecology;  Laterality: N/A;   TONSILLECTOMY  1953    Family History  Problem Relation Age of Onset   Skin cancer Father        skin cancer   Breast cancer Sister        breast and skin   Skin cancer Sister    Skin cancer Sister    Lung cancer Maternal Uncle    Breast cancer Paternal Aunt 48 - 15   Uterine cancer Paternal Aunt 18 - 89   Breast cancer Maternal Cousin 20 - 64   Melanoma Maternal Cousin    Prostate cancer Paternal Cousin 22 - 39   Colon cancer Neg Hx    Ovarian cancer Neg Hx    Endometrial cancer Neg Hx    Pancreatic cancer Neg Hx     Social History   Socioeconomic History   Marital status: Single    Spouse name: Not on file   Number of children: Not on file   Years of education: Not on file   Highest education level: Not on file  Occupational History   Not on file  Tobacco Use   Smoking status: Never   Smokeless tobacco: Never  Vaping Use   Vaping status: Never Used  Substance and Sexual Activity   Alcohol use: Never   Drug use: Never   Sexual activity: Not Currently    Birth control/protection: Surgical    Comment: hyst  Other Topics Concern   Not on file  Social History Narrative   Not on file   Social Drivers of Health   Financial Resource Strain: Not on file  Food Insecurity: No Food Insecurity (  04/14/2024)   Hunger Vital Sign    Worried About Running Out of Food in the Last Year: Never true    Ran Out of Food in the Last Year: Never true  Transportation Needs: No Transportation Needs (04/14/2024)   PRAPARE - Administrator, Civil Service (Medical): No    Lack of Transportation (Non-Medical): No  Physical Activity: Not on file  Stress: Not on file  Social Connections: Not on file    Current Medications:  Current Outpatient Medications:    acetaminophen  (TYLENOL ) 325 MG  tablet, Take 650 mg by mouth every 6 (six) hours as needed for mild pain., Disp: , Rfl:    amLODipine (NORVASC) 5 MG tablet, TAKE 1 TABLET BY MOUTH ONCE  DAILY for 90, Disp: , Rfl:    anastrozole  (ARIMIDEX ) 1 MG tablet, Take 1 tablet (1 mg total) by mouth daily., Disp: 90 tablet, Rfl: 3   aspirin 81 MG chewable tablet, Chew 81 mg by mouth daily., Disp: , Rfl:    BIOTIN MAXIMUM PO, Take by mouth., Disp: , Rfl:    calcium carbonate (OSCAL) 1500 (600 Ca) MG TABS tablet, Take 1,200 mg by mouth daily., Disp: , Rfl:    Cholecalciferol (VITAMIN D3) 125 MCG (5000 UT) TABS, Take 5,000 Units by mouth daily., Disp: , Rfl:    diclofenac Sodium (VOLTAREN) 1 % GEL, Apply 2 g topically daily as needed (Arthritis). For knees, Disp: , Rfl:    loratadine (CLARITIN) 10 MG tablet, Take 10 mg by mouth daily as needed for allergies or rhinitis., Disp: , Rfl:    Menthol, Topical Analgesic, (BIOFREEZE ROLL-ON EX), Apply 1 application topically daily as needed (Back pain)., Disp: , Rfl:    naphazoline-pheniramine (VISINE) 0.025-0.3 % ophthalmic solution, 1 drop daily as needed (Dry eye)., Disp: , Rfl:    oxyCODONE  (OXY IR/ROXICODONE ) 5 MG immediate release tablet, Take 0.5-1 tablets (2.5-5 mg total) by mouth every 6 (six) hours as needed for severe pain (pain score 7-10)., Disp: 5 tablet, Rfl: 0   rosuvastatin (CRESTOR) 5 MG tablet, Take 5 mg by mouth at bedtime., Disp: , Rfl:    Semaglutide, 1 MG/DOSE, (OZEMPIC, 1 MG/DOSE,) 2 MG/1.5ML SOPN, Inject 1 mg into the skin once a week. Injection on Sundays., Disp: , Rfl:    sodium chloride  (OCEAN) 0.65 % SOLN nasal spray, Place 1 spray into both nostrils as needed for congestion., Disp: , Rfl:    telmisartan (MICARDIS) 40 MG tablet, Take 40 mg by mouth daily., Disp: , Rfl:    UNABLE TO FIND, Apply 1 application topically daily as needed (Leg cramps). Hylands OTC for leg cramps, Disp: , Rfl:    zinc gluconate 50 MG tablet, Take 25 mg by mouth daily., Disp: , Rfl:   Review of  Systems: + leg swelling Denies appetite changes, fevers, chills, fatigue, unexplained weight changes. Denies hearing loss, neck lumps or masses, mouth sores, ringing in ears or voice changes. Denies cough or wheezing.  Denies shortness of breath. Denies chest pain or palpitations.  Denies abdominal distention, pain, blood in stools, constipation, diarrhea, nausea, vomiting, or early satiety. Denies pain with intercourse, dysuria, frequency, hematuria or incontinence. Denies hot flashes, pelvic pain, vaginal bleeding or vaginal discharge.   Denies joint pain, back pain or muscle pain/cramps. Denies itching, rash, or wounds. Denies dizziness, headaches, numbness or seizures. Denies swollen lymph nodes or glands, denies easy bruising or bleeding. Denies anxiety, depression, confusion, or decreased concentration.  Physical Exam: BP 133/72 (BP Location: Right  Arm, Patient Position: Sitting)   Pulse 86   Temp 97.7 F (36.5 C) (Oral)   Resp 20   Ht 5' 4 (1.626 m)   Wt 216 lb 9.6 oz (98.2 kg)   SpO2 97%   BMI 37.18 kg/m  General: Alert, oriented, no acute distress. HEENT: Normocephalic, atraumatic, sclera anicteric. Chest: Clear to auscultation bilaterally.  No wheezes or rhonchi. Cardiovascular: Regular rate and rhythm, no murmurs. Abdomen: Obese, soft, nontender.  Normoactive bowel sounds.  No masses or hepatosplenomegaly appreciated.  Well-healed incisions. Extremities: Grossly normal range of motion.  Warm, well perfused.  No edema bilaterally. Skin: No rashes or lesions noted. Lymphatics: No cervical, supraclavicular, or inguinal adenopathy. GU: Normal appearing external genitalia without erythema, excoriation, or lesions.  Speculum exam reveals mildly atrophic vaginal mucosa as well as radiation changes present at the cuff.  Bimanual exam reveals cuff intact, no nodularity or masses.  Minimal narrowing of the vagina at the apex, unchanged.  Rectovaginal exam confirms  findings.  Laboratory & Radiologic Studies: None new  Assessment & Plan: Becky Dudley is a 73 y.o. 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 MSI-H. MLH1 hypermethylation present.  Germline: negative. Recently diagnosed with early-stage hormone receptor positive breast cancer.   Patient is doing very well, NED on exam today.  The patient is using her vaginal dilator once a week.   We reviewed signs and symptoms that would be concerning for disease recurrence, and I stressed the importance of calling if she develops any of these between visits.  Per NCCN surveillance recommendations, we will continue with surveillance visits every 6 months. We will continue alternating visits between my office and radiation oncology.  She will see us  in September 2025.  20 minutes of total time was spent for this patient encounter, including preparation, face-to-face counseling with the patient and coordination of care, and documentation of the encounter.  Comer Dollar, MD  Division of Gynecologic Oncology  Department of Obstetrics and Gynecology  [facility]

## 2024-06-24 NOTE — Patient Instructions (Signed)
 It was good to see you today.  I do not see or feel any evidence of cancer recurrence on your exam.  We will see you for follow-up in 12 months.  As always, if you develop any new and concerning symptoms before your next visit, please call to see me sooner.

## 2024-06-29 ENCOUNTER — Encounter: Payer: Self-pay | Admitting: Radiation Oncology

## 2024-06-29 NOTE — Progress Notes (Signed)
 Radiation Oncology         [phone] ________________________________  Name: Becky Dudley MRN: [id number]  Date: 06/30/2024  DOB: 03/07/1951  Follow-Up Visit Note  CC: Becky Lamarr RAMAN, MD  Becky Dudley, *  No diagnosis found.  Diagnosis: The primary encounter diagnosis was Malignant neoplasm of upper-inner quadrant of right breast in female, estrogen receptor positive (HCC). A diagnosis of Endometrial cancer [facility]) was also pertinent to this visit.    Malignant neoplasm of upper-inner quadrant of right female breast (ER +, PR +, Her2 -)     History of FIGO grade 2 endometrioid carcinoma; s/p hysterectomy and BSO, Stage pT1a, pN0; s/p hysterectomy and adjuvant radiation completed on 01/02/2022    Interval Since Last Radiation:  1 month 3 days  Intent: curative  First Treatment Date: 2024-04-28 Last Treatment Date: 2024-05-27   Plan Name: Breast_R Site: Breast, Right Technique: 3D Mode: Photon Dose Per Fraction: 2.67 Gy Prescribed Dose (Delivered / Prescribed): 40.05 Gy / 40.05 Gy Prescribed Fxs (Delivered / Prescribed): 15 / 15   Plan Name: Breast_R_Bst Site: Breast, Right Technique: 3D Mode: Photon Dose Per Fraction: 2.3 Gy Prescribed Dose (Delivered / Prescribed): 13.8 Gy / 13.8 Gy Prescribed Fxs (Delivered / Prescribed): 6 / 6   Narrative:  The patient returns today for routine follow-up. She was last seen in office on 04/14/24 for a FUN visit Since then, patient completed her radiation treatment which she tolerated quite well. Patient did however endorse experiencing anticipated skin reaction.                               In the interval since she was last seen, she underwent genetic testing on 05/24/24 showing no pathogenic mutation contributing to her recent diagnosis.   She presented for a follow up visit with Dr. Odean on 05/24/24 during which anastrozole  1 mg daily for 5 to 7 years was initiated.   Her most recent visit with Dr. Viktoria on  06/24/24 where she was noted NED on exam with no symptoms or concerns indication disease recurrence.                     No other significant oncologic interval history since the patient was last seen.   Allergies:  is allergic to lisinopril.  Meds: Current Outpatient Medications  Medication Sig Dispense Refill   acetaminophen  (TYLENOL ) 325 MG tablet Take 650 mg by mouth every 6 (six) hours as needed for mild pain.     amLODipine (NORVASC) 5 MG tablet TAKE 1 TABLET BY MOUTH ONCE  DAILY for 90     anastrozole  (ARIMIDEX ) 1 MG tablet Take 1 tablet (1 mg total) by mouth daily. 90 tablet 3   aspirin 81 MG chewable tablet Chew 81 mg by mouth daily.     BIOTIN MAXIMUM PO Take by mouth.     calcium carbonate (OSCAL) 1500 (600 Ca) MG TABS tablet Take 1,200 mg by mouth daily.     Cholecalciferol (VITAMIN D3) 125 MCG (5000 UT) TABS Take 5,000 Units by mouth daily.     diclofenac Sodium (VOLTAREN) 1 % GEL Apply 2 g topically daily as needed (Arthritis). For knees     loratadine (CLARITIN) 10 MG tablet Take 10 mg by mouth daily as needed for allergies or rhinitis.     Menthol, Topical Analgesic, (BIOFREEZE ROLL-ON EX) Apply 1 application topically daily as needed (Back pain).  naphazoline-pheniramine (VISINE) 0.025-0.3 % ophthalmic solution 1 drop daily as needed (Dry eye).     oxyCODONE  (OXY IR/ROXICODONE ) 5 MG immediate release tablet Take 0.5-1 tablets (2.5-5 mg total) by mouth every 6 (six) hours as needed for severe pain (pain score 7-10). 5 tablet 0   rosuvastatin (CRESTOR) 5 MG tablet Take 5 mg by mouth at bedtime.     Semaglutide, 1 MG/DOSE, (OZEMPIC, 1 MG/DOSE,) 2 MG/1.5ML SOPN Inject 1 mg into the skin once a week. Injection on Sundays.     sodium chloride  (OCEAN) 0.65 % SOLN nasal spray Place 1 spray into both nostrils as needed for congestion.     telmisartan (MICARDIS) 40 MG tablet Take 40 mg by mouth daily.     UNABLE TO FIND Apply 1 application topically daily as needed (Leg cramps).  [employer]ands OTC for leg cramps     zinc gluconate 50 MG tablet Take 25 mg by mouth daily.     No current facility-administered medications for this visit.    Physical Findings: The patient is in no acute distress. Patient is alert and oriented.  vitals were not taken for this visit. .  No significant changes. Lungs are clear to auscultation bilaterally. Heart has regular rate and rhythm. No palpable cervical, supraclavicular, or axillary adenopathy. Abdomen soft, non-tender, normal bowel sounds.   Lab Findings: Lab Results  Component Value Date   WBC 7.6 10/09/2021   HGB 13.3 10/09/2021   HCT 40.0 10/09/2021   MCV 91.3 10/09/2021   PLT 275 10/09/2021    Radiographic Findings: No results found.  Impression:  Malignant neoplasm of central portion of right female breast  The patient is recovering from the effects of radiation.  ***  Plan:  ***   *** minutes of total time was spent for this patient encounter, including preparation, face-to-face counseling with the patient and coordination of care, physical exam, and documentation of the encounter. ____________________________________  Becky CHARM Nasuti, PhD, MD  This document serves as a record of services personally performed by Becky Nasuti, MD. It was created on his behalf by Becky Dudley, a trained medical scribe. The creation of this record is based on the scribe's personal observations and the provider's statements to them. This document has been checked and approved by the attending provider.

## 2024-06-30 ENCOUNTER — Ambulatory Visit
Admission: RE | Admit: 2024-06-30 | Discharge: 2024-06-30 | Disposition: A | Discharge status: Home / Self Care | Source: Ambulatory Visit | Attending: Radiation Oncology | Admitting: Radiation Oncology

## 2024-06-30 ENCOUNTER — Ambulatory Visit: Admitting: Radiation Oncology

## 2024-06-30 ENCOUNTER — Encounter: Payer: Self-pay | Admitting: Radiation Oncology

## 2024-06-30 VITALS — BP 154/78 | HR 63 | Temp 97.2°F | Resp 18 | Ht 64.0 in | Wt 214.5 lb

## 2024-06-30 DIAGNOSIS — Z79899 Other long term (current) drug therapy: Secondary | ICD-10-CM | POA: Diagnosis not present

## 2024-06-30 DIAGNOSIS — Z79811 Long term (current) use of aromatase inhibitors: Secondary | ICD-10-CM | POA: Diagnosis not present

## 2024-06-30 DIAGNOSIS — C50211 Malignant neoplasm of upper-inner quadrant of right female breast: Secondary | ICD-10-CM | POA: Diagnosis present

## 2024-06-30 DIAGNOSIS — Z7982 Long term (current) use of aspirin: Secondary | ICD-10-CM | POA: Insufficient documentation

## 2024-06-30 DIAGNOSIS — Z170 Estrogen receptor positive status [ER+]: Secondary | ICD-10-CM | POA: Diagnosis not present

## 2024-06-30 DIAGNOSIS — Z90722 Acquired absence of ovaries, bilateral: Secondary | ICD-10-CM | POA: Diagnosis not present

## 2024-06-30 DIAGNOSIS — Z8542 Personal history of malignant neoplasm of other parts of uterus: Secondary | ICD-10-CM | POA: Insufficient documentation

## 2024-06-30 DIAGNOSIS — Z90710 Acquired absence of both cervix and uterus: Secondary | ICD-10-CM | POA: Diagnosis not present

## 2024-06-30 DIAGNOSIS — Z923 Personal history of irradiation: Secondary | ICD-10-CM | POA: Insufficient documentation

## 2024-06-30 DIAGNOSIS — C541 Malignant neoplasm of endometrium: Secondary | ICD-10-CM

## 2024-06-30 NOTE — Progress Notes (Signed)
 Lenell Mcconnell Stites is here today for follow up post radiation to the breast.   Breast Side:Right   They completed their radiation on: 05/27/24   Does the patient complain of any of the following: Post radiation skin issues: Skin continues to heal. Continues to have some hyperpigmentation.  Breast Tenderness: No Breast Swelling: No Lymphadema: No Range of Motion limitations: No Fatigue post radiation: Yes, fair  Appetite good/fair/poor: Good   Additional comments if applicable: Patient taking anastrozole  as directed.    BP (!) 154/78 (BP Location: Left Arm, Patient Position: Sitting)   Pulse 63   Temp (!) 97.2 F (36.2 C) (Temporal)   Resp 18   Ht 5' 4 (1.626 m)   Wt 214 lb 8 oz (97.3 kg)   SpO2 100%   BMI 36.82 kg/m

## 2024-07-20 ENCOUNTER — Telehealth: Payer: Self-pay | Admitting: Genetic Counselor

## 2024-07-20 NOTE — Telephone Encounter (Signed)
 Returning call. Patient has not received report in mail. Sent via email from [employer] directly.

## 2024-07-26 ENCOUNTER — Other Ambulatory Visit: Payer: Self-pay | Admitting: General Surgery

## 2024-07-26 DIAGNOSIS — Z853 Personal history of malignant neoplasm of breast: Secondary | ICD-10-CM

## 2024-08-04 ENCOUNTER — Other Ambulatory Visit (HOSPITAL_BASED_OUTPATIENT_CLINIC_OR_DEPARTMENT_OTHER): Payer: Self-pay | Admitting: Family Medicine

## 2024-08-04 DIAGNOSIS — E2839 Other primary ovarian failure: Secondary | ICD-10-CM

## 2024-08-19 ENCOUNTER — Ambulatory Visit
Admission: RE | Admit: 2024-08-19 | Discharge: 2024-08-19 | Disposition: A | Discharge status: Home / Self Care | Source: Ambulatory Visit | Attending: General Surgery | Admitting: General Surgery

## 2024-08-19 DIAGNOSIS — Z853 Personal history of malignant neoplasm of breast: Secondary | ICD-10-CM

## 2024-08-19 HISTORY — DX: Personal history of irradiation: Z92.3

## 2024-08-22 ENCOUNTER — Inpatient Hospital Stay: Attending: Hematology and Oncology | Admitting: Adult Health

## 2024-08-22 ENCOUNTER — Inpatient Hospital Stay

## 2024-08-22 ENCOUNTER — Encounter: Payer: Self-pay | Admitting: Adult Health

## 2024-08-22 VITALS — BP 156/72 | HR 75 | Temp 98.0°F | Resp 18 | Wt 217.8 lb

## 2024-08-22 DIAGNOSIS — Z803 Family history of malignant neoplasm of breast: Secondary | ICD-10-CM | POA: Diagnosis not present

## 2024-08-22 DIAGNOSIS — Z801 Family history of malignant neoplasm of trachea, bronchus and lung: Secondary | ICD-10-CM | POA: Diagnosis not present

## 2024-08-22 DIAGNOSIS — Z170 Estrogen receptor positive status [ER+]: Secondary | ICD-10-CM | POA: Diagnosis not present

## 2024-08-22 DIAGNOSIS — C50211 Malignant neoplasm of upper-inner quadrant of right female breast: Secondary | ICD-10-CM | POA: Diagnosis not present

## 2024-08-22 DIAGNOSIS — Z8042 Family history of malignant neoplasm of prostate: Secondary | ICD-10-CM | POA: Insufficient documentation

## 2024-08-22 DIAGNOSIS — Z923 Personal history of irradiation: Secondary | ICD-10-CM | POA: Diagnosis not present

## 2024-08-22 DIAGNOSIS — Z8049 Family history of malignant neoplasm of other genital organs: Secondary | ICD-10-CM | POA: Diagnosis not present

## 2024-08-22 DIAGNOSIS — Z79811 Long term (current) use of aromatase inhibitors: Secondary | ICD-10-CM | POA: Diagnosis not present

## 2024-08-22 DIAGNOSIS — Z8542 Personal history of malignant neoplasm of other parts of uterus: Secondary | ICD-10-CM | POA: Insufficient documentation

## 2024-08-22 DIAGNOSIS — Z1732 Human epidermal growth factor receptor 2 negative status: Secondary | ICD-10-CM | POA: Insufficient documentation

## 2024-08-22 DIAGNOSIS — Z808 Family history of malignant neoplasm of other organs or systems: Secondary | ICD-10-CM | POA: Diagnosis not present

## 2024-08-22 DIAGNOSIS — Z1721 Progesterone receptor positive status: Secondary | ICD-10-CM | POA: Diagnosis not present

## 2024-08-23 NOTE — Progress Notes (Signed)
 SURVIVORSHIP VISIT:  BRIEF ONCOLOGIC HISTORY:  Oncology History Overview Note  MSI- H MLH1 promoter hypermethylation is present  MMR IHC MLH1:          LOSS OF NUCLEAR EXPRESSION  MSH2:          Preserved nuclear expression  MSH6:          Preserved nuclear expression  PMS2:          LOSS OF NUCLEAR EXPRESSION   Endometrial cancer (HCC)  09/30/2021 Initial Biopsy   Hysteroscopy D&C, and MyoSure resection of anterior wall endometrial polyp (noted to be incomplete).  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   10/04/2021 Initial Diagnosis   Endometrial cancer (HCC)   10/09/2021 Imaging   CT A/P: IMPRESSION: Endometrial thickening measuring 10 mm, consistent with known history of endometrial carcinoma.   2 cm subserosal fibroid in anterior uterine fundus.   No evidence of metastatic disease within the abdomen or pelvis.   Colonic diverticulosis, without radiographic evidence of diverticulitis.   Mild hepatic steatosis.   Aortic Atherosclerosis   10/10/2021 Surgery   TRH/BSO, SLN biopsy, cysto, repair vaginal laceration  Findings: On EUA, small mobile uterus. On intra-abdminal entry, some adhesions of omentum to the anterior abdominal wall in the mid right abdomen, filmy.  Normal liver, diaphragm, stomach.  Normal small and large bowel.  Uterus approximately 8 cm in size with a 1-2 cm fundal left fibroid.  Ovaries unremarkable although larger than would expect in a postmenopausal patient.  Normal-appearing fallopian tubes although disrupted.  Clip within the pelvis noted on bladder peritoneum.  Mapping successful bilaterally to external iliac sentinel lymph nodes.  No obvious evidence of extrauterine disease.  Right ovary adherent to the posterior uterus and broad ligament, rectum somewhat adherent posteriorly within the cul-de-sac.  Area of questionable deserosalization of the rectum was oversewn, negative bubble test.  Given significant  adhesions of the bladder to the cervix, cystoscopy was performed, bladder and dome intact, good efflux noted from bilateral ureteral orifices.   10/10/2021 Pathology Results   A. SENTINEL LYMPH NODE, RIGHT EXTERNAL ILIAC, BIOPSY:  -  No carcinoma identified in one lymph node (0/1)  -  See comment   B. SENTINEL LYMPH NODE, LEFT EXTERNAL ILIAC, BIOPSY:  -  No carcinoma identified in one lymph node (0/1)  -  See comment   C. UTERUS, CERVIX, BILATERAL TUBES AND OVARIES:  Uterus:  -  Endometrioid carcinoma, FIGO grade 2, largely involving an  endometrial polyp  -  Benign endometrial polyp  -  Leiomyomata (2.2 cm; largest)  -  Adenomyosis  -  See oncology table and comment below   Cervix:  -  No carcinoma identified   Bilateral Ovaries:  -  No carcinoma identified   Bilateral Fallopian tubes:  -  No carcinoma identified   ONCOLOGY TABLE:   UTERUS, CARCINOMA OR CARCINOSARCOMA: Resection   Procedure: Total hysterectomy and bilateral salpingo-oophorectomy  Histologic Type: Endometrioid carcinoma, NOS  Histologic Grade: FIGO grade 2  Myometrial Invasion:       Depth of Myometrial Invasion (mm): 2       Myometrial Thickness (mm): 22       Percentage of Myometrial Invasion: Less than 50%  Uterine Serosa Involvement: Not identified  Cervical stromal Involvement: Not identified  Extent of involvement of other tissue/organs: Not identified  Peritoneal/Ascitic Fluid: Not submitted unknown  Lymphovascular Invasion: Not identified  Regional Lymph Nodes:       Pelvic Lymph Nodes  Examined:  2 Sentinel                                0 non-sentinel                                   2 total       Pelvic Lymph Nodes with Metastasis: 0                           Macrometastasis: (>2.0 mm): 0                          Micrometastasis: (>0.2 mm and < 2.0 mm): 0                            Isolated Tumor Cells (<0.2 mm): 0                       Laterality of Lymph Node with Tumor: N/A                              Extracapsular Extension: N/A       Para-aortic Lymph Nodes Examined: N/A  Distant Metastasis:       Distant Site(s) Involved: N/A  Pathologic Stage Classification (pTNM, AJCC 8th Edition): pT1a, pN0  Ancillary Studies: MMR / MSI testing will be ordered  Representative Tumor Block: C6  Comment(s): See above   12/03/2021 - 01/02/2022 Radiation Therapy   12/03/2021 through 01/02/2022 Site Technique Total Dose (Gy) Dose per Fx (Gy) Completed Fx Beam Energies  Vagina: Pelvis HDR-brachy 30/30 6 5/5 Ir-192       Genetic Testing   Negative CancerNext-Expanded +RNAinsight. The CancerNext-Expanded gene panel offered by [facility] and includes sequencing, rearrangement, and RNA analysis for the following 77 genes: AIP, ALK, APC, ATM, AXIN2, BAP1, BARD1, BMPR1A, BRCA1, BRCA2, BRIP1, CDC73, CDH1, CDK4, CDKN1B, CDKN2A, CEBPA, CHEK2, CTNNA1, DDX41, DICER1, EGFR, EPCAM, ETV6, FH, FLCN, GATA2, GREM1, HOXB13, KIT, LZTR1, MAX, MBD4, MEN1, MET, MITF, MLH1, MSH2, MSH3, MSH6, MUTYH, NF1, NF2, NTHL1, PALB2, PDGFRA, PHOX2B, PMS2, POLD1, POLE, POT1, PRKAR1A, PTCH1, PTEN, RAD51C, RAD51D, RB1, RET, RPS20, RUNX1, SDHA, SDHAF2, SDHB, SDHC, SDHD, SMAD4, SMARCA4, SMARCB1, SMARCE1, STK11, SUFU, TMEM127, TP53, TSC1, TSC2, VHL, WT1.  Report date 06/10/24.    Malignant neoplasm of upper-inner quadrant of right breast in female, estrogen receptor positive (HCC)  03/09/2024 Initial Diagnosis   Malignant neoplasm of upper-inner quadrant of right breast in female, estrogen receptor positive (HCC)   03/14/2024 Surgery    Right lumpectomy: Grade 2 ILC 0.7 cm, multifocal with grade 2 DCIS, posterior margin positive for invasive cancer (pectoralis muscle) and DCIS, LVI not identified, ER 90%, PR 100%, HER2 0 negative, Ki-67 5%    03/14/2024 Cancer Staging   Staging form: Breast, AJCC 8th Edition - Pathologic stage from 03/14/2024: Stage IA (pT1b, pN0, cM0, G2, ER+, PR+, HER2-) - Signed by Crawford Morna Pickle, NP on  08/22/2024 Histologic grading system: 3 grade system   04/28/2024 - 05/27/2024 Radiation Therapy   Plan Name: Breast_R Site: Breast, Right Technique: 3D Mode: Photon Dose Per Fraction: 2.67 Gy Prescribed Dose (Delivered / Prescribed): 40.05 Gy / 40.05 Gy Prescribed Fxs (  Delivered / Prescribed): 15 / 15   Plan Name: Breast_R_Bst Site: Breast, Right Technique: 3D Mode: Photon Dose Per Fraction: 2.3 Gy Prescribed Dose (Delivered / Prescribed): 13.8 Gy / 13.8 Gy Prescribed Fxs (Delivered / Prescribed): 6 / 6   05/2024 -  Anti-estrogen oral therapy   Anastrozole      INTERVAL HISTORY:  Discussed the use of AI scribe software for clinical note transcription with the patient, who gave verbal consent to proceed.  History of Present Illness Becky Dudley is a 73 year old female with a history of right-sided estrogen and progesterone positive stage 1A breast cancer who presents for follow-up.  She is taking anastrozole  without significant issues, with no hot flashes or vaginal dryness. She experiences joint aches and pains, particularly in her knees, which predate anastrozole  use, and tightness in her hips and back when standing for long periods, which improves with sitting.  She underwent a lumpectomy and occasionally experiences sensations or tenderness at the site. She monitors for any swelling or increased pain.  She completed genetic testing for breast cancer and has undergone radiation therapy. She recently had a mammogram on the left side and is scheduled for a bilateral diagnostic mammogram in February, six months post-radiation. She is also scheduled for bone density testing in March.    REVIEW OF SYSTEMS:  Review of Systems  Constitutional:  Negative for appetite change, chills, fatigue, fever and unexpected weight change.  HENT:   Negative for hearing loss, lump/mass and trouble swallowing.   Eyes:  Negative for eye problems and icterus.  Respiratory:  Negative for chest  tightness, cough and shortness of breath.   Cardiovascular:  Negative for chest pain, leg swelling and palpitations.  Gastrointestinal:  Negative for abdominal distention, abdominal pain, constipation, diarrhea, nausea and vomiting.  Endocrine: Negative for hot flashes.  Genitourinary:  Negative for difficulty urinating.   Musculoskeletal:  Negative for arthralgias.  Skin:  Negative for itching and rash.  Neurological:  Negative for dizziness, extremity weakness, headaches and numbness.  Hematological:  Negative for adenopathy. Does not bruise/bleed easily.  Psychiatric/Behavioral:  Negative for depression. The patient is not nervous/anxious.    Breast: Denies any new nodularity, masses, tenderness, nipple changes, or nipple discharge.       PAST MEDICAL/SURGICAL HISTORY:  Past Medical History:  Diagnosis Date   Arthritis    both knees   Breast CA (HCC) 01/2024   right breast DCIS   CKD (chronic kidney disease), stage III (HCC)    no nephrologist   Complication of anesthesia    vertigo after TAH   Diabetes mellitus without complication (HCC)    DM2, injection once a week; cbgs normally 110-130   Essential hypertension    Gout    History of radiation therapy    Endometrium- 12/03/21-01/02/22- Dr. Lynwood Nasuti   History of radiation therapy    Right breast- 04/28/24-05/27/24 Dr. Lynwood Nasuti   Mixed hyperlipidemia    Morbid obesity [facility])    Personal history of radiation therapy    Pure hypercholesterolemia    uterine ca 09/2021   Vitamin D deficiency    Wears glasses    Past Surgical History:  Procedure Laterality Date   BREAST BIOPSY Right 08/21/2023   MM RT BREAST BX W LOC DEV 1ST LESION IMAGE BX SPEC STEREO GUIDE 08/21/2023 [facility]   BREAST BIOPSY Right 02/24/2024   MM RT BREAST BX W LOC DEV 1ST LESION IMAGE BX SPEC STEREO GUIDE 02/24/2024 [facility]   BREAST  BIOPSY  03/11/2024   MM RT RADIOACTIVE SEED LOC MAMMO GUIDE 03/11/2024 [facility]    BREAST LUMPECTOMY     BREAST LUMPECTOMY WITH RADIOACTIVE SEED LOCALIZATION Right 03/14/2024   Procedure: BREAST LUMPECTOMY WITH RADIOACTIVE SEED LOCALIZATION;  Surgeon: Aron Shoulders, MD;  Location: [facility];  Service: General;  Laterality: Right;   CHOLECYSTECTOMY  1989   DILATATION & CURETTAGE/HYSTEROSCOPY WITH MYOSURE N/A 09/30/2021   Procedure: DILATATION & CURETTAGE/HYSTEROSCOPY WITH MYOSURE;  Surgeon: Gorge Ade, MD;  Location: [facility];  Service: Gynecology;  Laterality: N/A;   EYE SURGERY     cataract surgery, lens implant on right eye 2008 Left eye 2023   LASIK Bilateral 2002   LYMPH NODE DISSECTION N/A 10/10/2021   Procedure: LYMPH NODE DISSECTION;  Surgeon: Viktoria Comer SAUNDERS, MD;  Location: [facility];  Service: Gynecology;  Laterality: N/A;   ROBOTIC ASSISTED TOTAL HYSTERECTOMY WITH BILATERAL SALPINGO OOPHERECTOMY Bilateral 10/10/2021   Procedure: XI ROBOTIC ASSISTED TOTAL HYSTERECTOMY WITH BILATERAL SALPINGO OOPHORECTOMY;CYSTOSCOPY;  Surgeon: Viktoria Comer SAUNDERS, MD;  Location: [facility];  Service: Gynecology;  Laterality: Bilateral;   SENTINEL NODE BIOPSY N/A 10/10/2021   Procedure: SENTINEL NODE BIOPSY;  Surgeon: Viktoria Comer SAUNDERS, MD;  Location: [facility];  Service: Gynecology;  Laterality: N/A;   TONSILLECTOMY  1953     ALLERGIES:  Allergies  Allergen Reactions   Lisinopril Cough     CURRENT MEDICATIONS:  Outpatient Encounter Medications as of 08/22/2024  Medication Sig   acetaminophen  (TYLENOL ) 325 MG tablet Take 650 mg by mouth every 6 (six) hours as needed for mild pain.   amLODipine (NORVASC) 5 MG tablet TAKE 1 TABLET BY MOUTH ONCE  DAILY for 90   anastrozole  (ARIMIDEX ) 1 MG tablet Take 1 tablet (1 mg total) by mouth daily.   aspirin 81 MG chewable tablet Chew 81 mg by mouth daily.   BIOTIN MAXIMUM PO Take by mouth.   calcium carbonate (OSCAL) 1500 (600 Ca) MG TABS tablet Take 1,200 mg by mouth daily.   Cholecalciferol (VITAMIN  D3) 125 MCG (5000 UT) TABS Take 5,000 Units by mouth daily.   diclofenac Sodium (VOLTAREN) 1 % GEL Apply 2 g topically daily as needed (Arthritis). For knees   loratadine (CLARITIN) 10 MG tablet Take 10 mg by mouth daily as needed for allergies or rhinitis.   magnesium 30 MG tablet Take 30 mg by mouth 2 (two) times daily.   Menthol, Topical Analgesic, (BIOFREEZE ROLL-ON EX) Apply 1 application topically daily as needed (Back pain).   naphazoline-pheniramine (VISINE) 0.025-0.3 % ophthalmic solution 1 drop daily as needed (Dry eye).   oxyCODONE  (OXY IR/ROXICODONE ) 5 MG immediate release tablet Take 0.5-1 tablets (2.5-5 mg total) by mouth every 6 (six) hours as needed for severe pain (pain score 7-10).   rosuvastatin (CRESTOR) 5 MG tablet Take 5 mg by mouth at bedtime.   Semaglutide, 1 MG/DOSE, (OZEMPIC, 1 MG/DOSE,) 2 MG/1.5ML SOPN Inject 1 mg into the skin once a week. Injection on Sundays.   sodium chloride  (OCEAN) 0.65 % SOLN nasal spray Place 1 spray into both nostrils as needed for congestion.   telmisartan (MICARDIS) 40 MG tablet Take 40 mg by mouth daily.   UNABLE TO FIND Apply 1 application topically daily as needed (Leg cramps). [facility] for leg cramps   zinc gluconate 50 MG tablet Take 25 mg by mouth daily.   No facility-administered encounter medications on file as of 08/22/2024.     ONCOLOGIC FAMILY HISTORY:  Family History  Problem Relation Age of Onset   Skin cancer Father        skin cancer   Breast cancer Sister        breast and skin   Skin cancer Sister    Skin cancer Sister    Lung cancer Maternal Uncle    Breast cancer Paternal Aunt 46 - 69   Uterine cancer Paternal Aunt 44 - 89   Breast cancer Maternal Cousin 75 - 59   Melanoma Maternal Cousin    Prostate cancer Paternal Cousin 70 - 49   Colon cancer Neg Hx    Ovarian cancer Neg Hx    Endometrial cancer Neg Hx    Pancreatic cancer Neg Hx      SOCIAL HISTORY:  Social History   Socioeconomic History    Marital status: Single    Spouse name: Not on file   Number of children: Not on file   Years of education: Not on file   Highest education level: Not on file  Occupational History   Not on file  Tobacco Use   Smoking status: Never   Smokeless tobacco: Never  Vaping Use   Vaping status: Never Used  Substance and Sexual Activity   Alcohol use: Not on file   Drug use: Not on file   Sexual activity: Not on file    Comment: hyst  Other Topics Concern   Not on file  Social History Narrative   Not on file   Social Drivers of Health   Financial Resource Strain: Not on file  Food Insecurity: No Food Insecurity (08/22/2024)   Hunger Vital Sign    Worried About Running Out of Food in the Last Year: Never true    Ran Out of Food in the Last Year: Never true  Transportation Needs: No Transportation Needs (08/22/2024)   PRAPARE - Administrator, Civil Service (Medical): No    Lack of Transportation (Non-Medical): No  Physical Activity: Not on file  Stress: Not on file  Social Connections: Not on file  Intimate Partner Violence: Not At Risk (08/22/2024)   Humiliation, Afraid, Rape, and Kick questionnaire    Fear of Current or Ex-Partner: No    Emotionally Abused: No    Physically Abused: No    Sexually Abused: No     OBSERVATIONS/OBJECTIVE:  BP (!) 156/72 (BP Location: Left Arm, Patient Position: Sitting)   Pulse 75   Temp 98 F (36.7 C) (Temporal)   Resp 18   Wt 217 lb 12.8 oz (98.8 kg)   SpO2 96%   BMI 37.39 kg/m  GENERAL: Patient is a well appearing female in no acute distress HEENT:  Sclerae anicteric.  Oropharynx clear and moist. No ulcerations or evidence of oropharyngeal candidiasis. Neck is supple.  NODES:  No cervical, supraclavicular, or axillary lymphadenopathy palpated.  BREAST EXAM:  Right breast s/p lumpectomy and radiation, no sign of local recurrence; left breast benign LUNGS:  Clear to auscultation bilaterally.  No wheezes or rhonchi. HEART:   Regular rate and rhythm. No murmur appreciated. ABDOMEN:  Soft, nontender.  Positive, normoactive bowel sounds. No organomegaly palpated. MSK:  No focal spinal tenderness to palpation. Full range of motion bilaterally in the upper extremities. EXTREMITIES:  No peripheral edema.   SKIN:  Clear with no obvious rashes or skin changes. No nail dyscrasia. NEURO:  Nonfocal. Well oriented.  Appropriate affect.   LABORATORY DATA:  None for this visit.  DIAGNOSTIC IMAGING:  None for this visit.  ASSESSMENT AND PLAN:  Ms.. Petsch is a pleasant 73 y.o. female with Stage IA right breast invasive ductal carcinoma, ER+/PR+/HER2-, diagnosed in 02/2024, treated with lumpectomy, adjuvant radiation therapy, and anti-estrogen therapy with Anastrozole  beginning in 05/2024.  She presents to the Survivorship Clinic for our initial meeting and routine follow-up post-completion of treatment for breast cancer.    1. Stage IA right breast cancer:  Ms. Dugo is continuing to recover from definitive treatment for breast cancer. She will follow-up with her medical oncologist, Dr.  Gudena in 03/2025 with history and physical exam per surveillance protocol.  She will continue her anti-estrogen therapy with Anastrozole . Thus far, she is tolerating the Anastrozole  well, with minimal side effects. Her mammogram is due 11/2024; orders placed today.  Guardant reveal testing will occur every 6 months. Today, a comprehensive survivorship care plan and treatment summary was reviewed with the patient today detailing her breast cancer diagnosis, treatment course, potential late/long-term effects of treatment, appropriate follow-up care with recommendations for the future, and patient education resources.  A copy of this summary, along with a letter will be sent to the patient's primary care provider via mail/fax/In Basket message after today's visit.    2. Bone health:  Given Ms. Mahrt's age/history of breast cancer and her  current treatment regimen including anti-estrogen therapy with Anastrozole , she is at risk for bone demineralization.  She is scheduled for bone density testing in 12/2024. She was given education on specific activities to promote bone health.  4. Cancer screening:  Due to Ms. Shoberg's history and her age, she should receive screening for skin cancers, colon cancer, and gynecologic cancers.  The information and recommendations are listed on the patient's comprehensive care plan/treatment summary and were reviewed in detail with the patient.    5. Health maintenance and wellness promotion: Ms. Robotham was encouraged to consume 5-7 servings of fruits and vegetables per day. We reviewed the Nutrition Rainbow handout.  She was also encouraged to engage in moderate to vigorous exercise for 30 minutes per day most days of the week.  She was instructed to limit her alcohol consumption and continue to abstain from tobacco use.     6. Support services/counseling: It is not uncommon for this period of the patient's cancer care trajectory to be one of many emotions and stressors.   She was given information regarding our available services and encouraged to contact me with any questions or for help enrolling in any of our support group/programs.    Follow up instructions:    -Return to cancer center in 03/2025 for follow-up with Dr. Odean  -Mammogram due in 11/2024 -See Dr. Aron in 09/2024 -DEXA 12/2024 -Guardant reveal every 6 months -She is welcome to return back to the Survivorship Clinic at any time; no additional follow-up needed at this time.  -Consider referral back to survivorship as a long-term survivor for continued surveillance  The patient was provided an opportunity to ask questions and all were answered. The patient agreed with the plan and demonstrated an understanding of the instructions.   Total encounter time:40 minutes*in face-to-face visit time, chart review, lab review, care  coordination, order entry, and documentation of the encounter time.    Morna Kendall, NP 08/23/24 1:51 PM Medical Oncology and Hematology [facility] [address] [place], [place] [address] Tel. [phone]    Fax. [phone]  *Total Encounter Time as defined by the Centers for Medicare and Medicaid Services includes, in addition to the face-to-face time of  a patient visit (documented in the note above) non-face-to-face time: obtaining and reviewing outside history, ordering and reviewing medications, tests or procedures, care coordination (communications with other health care professionals or caregivers) and documentation in the medical record.

## 2024-09-07 ENCOUNTER — Encounter: Payer: Self-pay | Admitting: Adult Health

## 2024-09-09 LAB — GUARDANT REVEAL

## 2024-11-28 ENCOUNTER — Encounter

## 2024-12-20 ENCOUNTER — Other Ambulatory Visit (HOSPITAL_BASED_OUTPATIENT_CLINIC_OR_DEPARTMENT_OTHER)

## 2024-12-26 ENCOUNTER — Ambulatory Visit: Payer: Self-pay | Admitting: Radiation Oncology

## 2025-03-23 ENCOUNTER — Inpatient Hospital Stay

## 2025-03-23 ENCOUNTER — Inpatient Hospital Stay: Admitting: Hematology and Oncology

## 2025-06-27 ENCOUNTER — Ambulatory Visit: Admitting: Gynecologic Oncology
